# Patient Record
Sex: Female | Born: 1950 | Race: White | Hispanic: No | Marital: Married | Smoking: Never smoker
Health system: Southern US, Community
[De-identification: ages and names within clinical notes are randomized; demographics above are authoritative.]

## PROBLEM LIST (undated history)

## (undated) DIAGNOSIS — G709 Myoneural disorder, unspecified: Secondary | ICD-10-CM

## (undated) DIAGNOSIS — M179 Osteoarthritis of knee, unspecified: Secondary | ICD-10-CM

## (undated) DIAGNOSIS — K219 Gastro-esophageal reflux disease without esophagitis: Secondary | ICD-10-CM

## (undated) DIAGNOSIS — I10 Essential (primary) hypertension: Secondary | ICD-10-CM

## (undated) DIAGNOSIS — K635 Polyp of colon: Secondary | ICD-10-CM

## (undated) DIAGNOSIS — R7303 Prediabetes: Secondary | ICD-10-CM

## (undated) DIAGNOSIS — C801 Malignant (primary) neoplasm, unspecified: Secondary | ICD-10-CM

## (undated) DIAGNOSIS — D649 Anemia, unspecified: Secondary | ICD-10-CM

## (undated) DIAGNOSIS — J189 Pneumonia, unspecified organism: Secondary | ICD-10-CM

## (undated) DIAGNOSIS — M1710 Unilateral primary osteoarthritis, unspecified knee: Secondary | ICD-10-CM

## (undated) HISTORY — DX: Osteoarthritis of knee, unspecified: M17.9

## (undated) HISTORY — PX: WISDOM TOOTH EXTRACTION: SHX21

## (undated) HISTORY — DX: Essential (primary) hypertension: I10

## (undated) HISTORY — PX: CYSTOSCOPY: SUR368

## (undated) HISTORY — PX: LAPAROSCOPY: SHX197

## (undated) HISTORY — DX: Polyp of colon: K63.5

## (undated) HISTORY — DX: Gastro-esophageal reflux disease without esophagitis: K21.9

## (undated) HISTORY — DX: Unilateral primary osteoarthritis, unspecified knee: M17.10

## (undated) HISTORY — PX: OTHER SURGICAL HISTORY: SHX169

---

## 2003-12-17 ENCOUNTER — Ambulatory Visit: Payer: Self-pay | Admitting: Internal Medicine

## 2003-12-23 ENCOUNTER — Ambulatory Visit: Payer: Self-pay | Admitting: Internal Medicine

## 2004-01-15 ENCOUNTER — Ambulatory Visit: Payer: Self-pay | Admitting: Internal Medicine

## 2005-01-19 ENCOUNTER — Ambulatory Visit: Payer: Self-pay | Admitting: Internal Medicine

## 2005-01-25 ENCOUNTER — Ambulatory Visit: Payer: Self-pay | Admitting: Internal Medicine

## 2005-01-26 ENCOUNTER — Ambulatory Visit: Payer: Self-pay | Admitting: Family Medicine

## 2005-02-10 ENCOUNTER — Ambulatory Visit: Payer: Self-pay | Admitting: Internal Medicine

## 2005-08-08 ENCOUNTER — Ambulatory Visit: Payer: Self-pay | Admitting: Internal Medicine

## 2005-09-09 ENCOUNTER — Encounter: Admission: RE | Admit: 2005-09-09 | Discharge: 2005-09-09 | Payer: Self-pay | Admitting: Internal Medicine

## 2006-01-20 ENCOUNTER — Ambulatory Visit: Payer: Self-pay | Admitting: Internal Medicine

## 2006-01-20 LAB — CONVERTED CEMR LAB
ALT: 16 units/L (ref 0–40)
Chol/HDL Ratio, serum: 3.2
Cholesterol: 195 mg/dL (ref 0–200)
GFR calc non Af Amer: 92 mL/min
Glucose, Bld: 96 mg/dL (ref 70–99)
HCT: 39.6 % (ref 36.0–46.0)
HDL: 60.1 mg/dL (ref >39.0)
Hgb A1c MFr Bld: 5.7 % (ref 4.6–6.0)
Lymphocytes Relative: 27.5 % (ref 12.0–46.0)
MCHC: 33.7 g/dL (ref 30.0–36.0)
MCV: 88.2 fL (ref 78.0–100.0)
Monocytes Absolute: 0.5 10*3/uL (ref 0.2–0.7)
Neutrophils Relative %: 63.3 % (ref 43.0–77.0)
Potassium: 3.3 meq/L — ABNORMAL LOW (ref 3.5–5.1)
TSH: 1.45 microintl units/mL (ref 0.35–5.50)
Total Bilirubin: 0.9 mg/dL (ref 0.3–1.2)
Triglyceride fasting, serum: 94 mg/dL (ref 0–149)
Vitamin B-12: 1304 pg/mL — ABNORMAL HIGH (ref 211–911)
WBC: 6.7 10*3/uL (ref 4.5–10.5)

## 2006-01-31 DIAGNOSIS — K219 Gastro-esophageal reflux disease without esophagitis: Secondary | ICD-10-CM

## 2006-01-31 DIAGNOSIS — K635 Polyp of colon: Secondary | ICD-10-CM

## 2006-01-31 HISTORY — DX: Gastro-esophageal reflux disease without esophagitis: K21.9

## 2006-01-31 HISTORY — DX: Polyp of colon: K63.5

## 2006-01-31 HISTORY — PX: UPPER GI ENDOSCOPY: SHX6162

## 2006-02-16 ENCOUNTER — Ambulatory Visit: Payer: Self-pay | Admitting: Internal Medicine

## 2006-02-20 ENCOUNTER — Ambulatory Visit: Payer: Self-pay | Admitting: Internal Medicine

## 2006-02-20 LAB — CONVERTED CEMR LAB
Bilirubin Urine: NEGATIVE
Nitrite: NEGATIVE
Potassium: 3.9 meq/L (ref 3.5–5.1)
Protein, ur: NEGATIVE mg/dL
RBC / HPF: NONE SEEN (ref <3)
Urine Glucose: NEGATIVE mg/dL
Urobilinogen, UA: 0.2 (ref 0.0–1.0)

## 2006-06-27 DIAGNOSIS — G43009 Migraine without aura, not intractable, without status migrainosus: Secondary | ICD-10-CM | POA: Insufficient documentation

## 2006-06-27 DIAGNOSIS — Z9889 Other specified postprocedural states: Secondary | ICD-10-CM | POA: Insufficient documentation

## 2006-07-03 ENCOUNTER — Telehealth (INDEPENDENT_AMBULATORY_CARE_PROVIDER_SITE_OTHER): Payer: Self-pay | Admitting: *Deleted

## 2006-07-11 ENCOUNTER — Telehealth (INDEPENDENT_AMBULATORY_CARE_PROVIDER_SITE_OTHER): Payer: Self-pay | Admitting: *Deleted

## 2006-08-02 ENCOUNTER — Ambulatory Visit: Payer: Self-pay | Admitting: Internal Medicine

## 2006-10-24 ENCOUNTER — Ambulatory Visit: Payer: Self-pay | Admitting: Internal Medicine

## 2006-10-24 DIAGNOSIS — K219 Gastro-esophageal reflux disease without esophagitis: Secondary | ICD-10-CM | POA: Insufficient documentation

## 2006-10-26 ENCOUNTER — Encounter (INDEPENDENT_AMBULATORY_CARE_PROVIDER_SITE_OTHER): Payer: Self-pay | Admitting: *Deleted

## 2006-10-26 ENCOUNTER — Encounter: Payer: Self-pay | Admitting: Internal Medicine

## 2006-10-27 ENCOUNTER — Telehealth (INDEPENDENT_AMBULATORY_CARE_PROVIDER_SITE_OTHER): Payer: Self-pay | Admitting: *Deleted

## 2006-11-01 ENCOUNTER — Encounter: Payer: Self-pay | Admitting: Internal Medicine

## 2006-12-05 ENCOUNTER — Ambulatory Visit: Payer: Self-pay | Admitting: Internal Medicine

## 2006-12-22 ENCOUNTER — Ambulatory Visit: Payer: Self-pay | Admitting: Internal Medicine

## 2006-12-22 ENCOUNTER — Encounter: Payer: Self-pay | Admitting: Internal Medicine

## 2006-12-22 DIAGNOSIS — K5730 Diverticulosis of large intestine without perforation or abscess without bleeding: Secondary | ICD-10-CM | POA: Insufficient documentation

## 2006-12-22 DIAGNOSIS — K208 Other esophagitis without bleeding: Secondary | ICD-10-CM | POA: Insufficient documentation

## 2007-01-26 ENCOUNTER — Ambulatory Visit: Payer: Self-pay | Admitting: Internal Medicine

## 2007-01-26 DIAGNOSIS — I10 Essential (primary) hypertension: Secondary | ICD-10-CM | POA: Insufficient documentation

## 2007-02-02 ENCOUNTER — Encounter (INDEPENDENT_AMBULATORY_CARE_PROVIDER_SITE_OTHER): Payer: Self-pay | Admitting: *Deleted

## 2008-01-28 ENCOUNTER — Ambulatory Visit: Payer: Self-pay | Admitting: Internal Medicine

## 2008-01-28 DIAGNOSIS — Z860100 Personal history of colon polyps, unspecified: Secondary | ICD-10-CM | POA: Insufficient documentation

## 2008-01-28 DIAGNOSIS — J309 Allergic rhinitis, unspecified: Secondary | ICD-10-CM | POA: Insufficient documentation

## 2008-01-29 ENCOUNTER — Encounter: Payer: Self-pay | Admitting: Internal Medicine

## 2008-02-04 ENCOUNTER — Encounter (INDEPENDENT_AMBULATORY_CARE_PROVIDER_SITE_OTHER): Payer: Self-pay | Admitting: *Deleted

## 2008-04-22 ENCOUNTER — Ambulatory Visit: Payer: Self-pay | Admitting: Internal Medicine

## 2008-04-22 DIAGNOSIS — M169 Osteoarthritis of hip, unspecified: Secondary | ICD-10-CM | POA: Insufficient documentation

## 2008-04-22 DIAGNOSIS — M7120 Synovial cyst of popliteal space [Baker], unspecified knee: Secondary | ICD-10-CM | POA: Insufficient documentation

## 2008-04-22 DIAGNOSIS — IMO0002 Reserved for concepts with insufficient information to code with codable children: Secondary | ICD-10-CM | POA: Insufficient documentation

## 2008-04-22 DIAGNOSIS — M1710 Unilateral primary osteoarthritis, unspecified knee: Secondary | ICD-10-CM

## 2008-05-07 ENCOUNTER — Telehealth: Payer: Self-pay | Admitting: Internal Medicine

## 2009-02-12 ENCOUNTER — Telehealth (INDEPENDENT_AMBULATORY_CARE_PROVIDER_SITE_OTHER): Payer: Self-pay | Admitting: *Deleted

## 2009-04-20 ENCOUNTER — Encounter: Payer: Self-pay | Admitting: Internal Medicine

## 2009-04-21 ENCOUNTER — Encounter: Payer: Self-pay | Admitting: Internal Medicine

## 2009-04-27 ENCOUNTER — Encounter: Payer: Self-pay | Admitting: Internal Medicine

## 2009-04-28 ENCOUNTER — Ambulatory Visit (HOSPITAL_COMMUNITY): Admission: RE | Admit: 2009-04-28 | Discharge: 2009-04-28 | Payer: Self-pay | Admitting: Obstetrics and Gynecology

## 2009-04-28 ENCOUNTER — Encounter: Payer: Self-pay | Admitting: Internal Medicine

## 2009-05-21 ENCOUNTER — Telehealth (INDEPENDENT_AMBULATORY_CARE_PROVIDER_SITE_OTHER): Payer: Self-pay | Admitting: *Deleted

## 2009-05-28 ENCOUNTER — Ambulatory Visit: Payer: Self-pay | Admitting: Internal Medicine

## 2009-05-28 DIAGNOSIS — Z8719 Personal history of other diseases of the digestive system: Secondary | ICD-10-CM | POA: Insufficient documentation

## 2009-05-28 DIAGNOSIS — R209 Unspecified disturbances of skin sensation: Secondary | ICD-10-CM | POA: Insufficient documentation

## 2009-07-24 ENCOUNTER — Ambulatory Visit: Payer: Self-pay | Admitting: Internal Medicine

## 2009-10-29 ENCOUNTER — Ambulatory Visit: Payer: Self-pay | Admitting: Internal Medicine

## 2010-01-18 ENCOUNTER — Encounter: Payer: Self-pay | Admitting: Internal Medicine

## 2010-02-28 LAB — CONVERTED CEMR LAB
ALT: 15 units/L (ref 0–35)
ALT: 16 units/L (ref 0–35)
ALT: 18 units/L (ref 0–35)
AST: 17 units/L (ref 0–37)
AST: 18 units/L (ref 0–37)
AST: 18 units/L (ref 0–37)
Albumin: 4.1 g/dL (ref 3.5–5.2)
Alkaline Phosphatase: 108 units/L (ref 39–117)
BUN: 14 mg/dL (ref 6–23)
BUN: 8 mg/dL (ref 6–23)
Basophils Absolute: 0 10*3/uL (ref 0.0–0.1)
Basophils Absolute: 0 10*3/uL (ref 0.0–0.1)
Basophils Absolute: 0.1 10*3/uL (ref 0.0–0.1)
Basophils Relative: 0.6 % (ref 0.0–3.0)
Basophils Relative: 0.8 % (ref 0.0–1.0)
Bilirubin, Direct: 0 mg/dL (ref 0.0–0.3)
CO2: 28 meq/L (ref 19–32)
Calcium: 9.2 mg/dL (ref 8.4–10.5)
Chloride: 102 meq/L (ref 96–112)
Chloride: 105 meq/L (ref 96–112)
Cholesterol: 180 mg/dL (ref 0–200)
Cholesterol: 182 mg/dL (ref 0–200)
Cholesterol: 188 mg/dL (ref 0–200)
Creatinine, Ser: 0.6 mg/dL (ref 0.4–1.2)
Creatinine, Ser: 0.7 mg/dL (ref 0.4–1.2)
Eosinophils Absolute: 0.1 10*3/uL (ref 0.0–0.7)
Eosinophils Relative: 1.1 % (ref 0.0–5.0)
Eosinophils Relative: 1.4 % (ref 0.0–5.0)
Eosinophils Relative: 1.7 % (ref 0.0–5.0)
GFR calc non Af Amer: 109 mL/min
Glucose, Bld: 90 mg/dL (ref 70–99)
HDL: 46.8 mg/dL (ref >39.0)
HDL: 57.7 mg/dL (ref >39.00)
Hemoglobin: 12.8 g/dL (ref 12.0–15.0)
LDL Cholesterol: 95 mg/dL (ref 0–99)
Lymphocytes Relative: 25.4 % (ref 12.0–46.0)
Lymphocytes Relative: 31.1 % (ref 12.0–46.0)
Lymphs Abs: 1.5 10*3/uL (ref 0.7–4.0)
Monocytes Absolute: 0.3 10*3/uL (ref 0.1–1.0)
Monocytes Absolute: 0.4 10*3/uL (ref 0.1–1.0)
Monocytes Absolute: 0.4 10*3/uL (ref 0.2–0.7)
Monocytes Relative: 5.6 % (ref 3.0–12.0)
Neutro Abs: 5 10*3/uL (ref 1.4–7.7)
Neutrophils Relative %: 61.3 % (ref 43.0–77.0)
Neutrophils Relative %: 66.8 % (ref 43.0–77.0)
Neutrophils Relative %: 69.8 % (ref 43.0–77.0)
Platelets: 199 10*3/uL (ref 150.0–400.0)
Potassium: 4.3 meq/L (ref 3.5–5.1)
RBC: 4.31 M/uL (ref 3.87–5.11)
RBC: 4.46 M/uL (ref 3.87–5.11)
RDW: 12.8 % (ref 11.5–14.6)
RDW: 13.9 % (ref 11.5–14.6)
Sodium: 138 meq/L (ref 135–145)
Sodium: 140 meq/L (ref 135–145)
TSH: 1.39 microintl units/mL (ref 0.35–5.50)
TSH: 1.93 microintl units/mL (ref 0.35–5.50)
Total Bilirubin: 0.7 mg/dL (ref 0.3–1.2)
Total CHOL/HDL Ratio: 3.8
Total Protein: 7.7 g/dL (ref 6.0–8.3)
Triglycerides: 100 mg/dL (ref 0–149)
Triglycerides: 191 mg/dL — ABNORMAL HIGH (ref 0–149)
Triglycerides: 64 mg/dL (ref 0.0–149.0)
VLDL: 12.8 mg/dL (ref 0.0–40.0)
VLDL: 38 mg/dL (ref 0–40)
WBC: 4.9 10*3/uL (ref 4.5–10.5)
WBC: 7.2 10*3/uL (ref 4.5–10.5)

## 2010-03-02 NOTE — Letter (Signed)
Summary: External Correspondence-[facility]  External Correspondence-[facility]   Imported By: Vanessa Swaziland 12/18/2006 15:11:10  _____________________________________________________________________  External Attachment:    Type:   Image     Comment:   External Document

## 2010-03-02 NOTE — Progress Notes (Signed)
Summary: Shingles Vaccine Concerns  Phone Note Call from Patient Call back at [phone]   Summary of Call: Message left on VM: patient would like to discuss Shingles vaccine, patient has an appointment for next week and would like vaccine at that time.   I called patient back to inform her that he do not have the vaccine in stock and we have no set date as to when we will get it. I offered to give patient a rx for the vaccine and she can check with the pharmacies and see if they have it in stock and it can be adminisetered here. Patient agreed and requested to have rx mailed to her home address    New/Updated Medications: ZOSTAVAX 11914 UNT/0.65ML SOLR (ZOSTER VACCINE LIVE) 1 injection 0.23mL Subcutaneously X 1 Prescriptions: ZOSTAVAX 78295 UNT/0.65ML SOLR (ZOSTER VACCINE LIVE) 1 injection 0.60mL Subcutaneously X 1  #1 x 0   Entered by:   Shonna Chock   Authorized by:   Marga Melnick MD   Signed by:   Shonna Chock on 05/21/2009   Method used:   Print then Mail to Patient   RxID:   [id number]

## 2010-03-02 NOTE — Letter (Signed)
Summary: Results Follow up Letter  [facility] [place]  [address] [place], [place] [address]   Phone: [phone]  Fax: [phone]    02/02/2007 MRN: [id number]  [facility] [address] [place], [place]  [address]  Dear Ms. Slay,  The following are the results of your recent test(s):  Test         Result    Pap Smear:        Normal _____  Not Normal _____ Comments: ______________________________________________________ Cholesterol: LDL(Bad cholesterol):         Your goal is less than:         HDL (Good cholesterol):       Your goal is more than: Comments:  ______________________________________________________ Mammogram:        Normal _____  Not Normal _____ Comments:  ___________________________________________________________________ Hemoccult:        Normal _____  Not normal _______ Comments:    _____________________________________________________________________ Other Tests:  Please see attached results and comments   We routinely do not discuss normal results over the telephone.  If you desire a copy of the results, or you have any questions about this information we can discuss them at your next office visit.   Sincerely,

## 2010-03-02 NOTE — Letter (Signed)
Summary: [facility]   Imported By: Lanelle Bal 05/18/2009 09:44:06  _____________________________________________________________________  External Attachment:    Type:   Image     Comment:   External Document

## 2010-03-02 NOTE — Assessment & Plan Note (Signed)
Summary: SHINGLES VACCINE//KN   Nurse Visit   Allergies: 1)  ! * Emycin  Immunizations Administered:  Zostavax # 1:    Vaccine Type: Zostavax    Site: Right Arm    Mfr: Merck    Dose: 0.39mL    Route: [address]    Given by: Shonna Chock    Exp. Date: 08/26/2010    Lot #: [id number]  Orders Added: 1)  Zoster (Shingles) Vaccine Live [90736] 2)  Admin 1st Vaccine [14782]

## 2010-03-02 NOTE — Assessment & Plan Note (Signed)
Summary: flu shot/kn   Nurse Visit   Allergies: 1)  ! * Emycin  Orders Added: 1)  Admin 1st Vaccine [90471] 2)  Flu Vaccine 23yrs + [60454] Flu Vaccine Consent Questions     Do you have a history of severe allergic reactions to this vaccine? no    Any prior history of allergic reactions to egg and/or gelatin? no    Do you have a sensitivity to the preservative Thimersol? no    Do you have a past history of Guillan-Barre Syndrome? no    Do you currently have an acute febrile illness? no    Have you ever had a severe reaction to latex? no    Vaccine information given and explained to patient? yes    Are you currently pregnant? no    Lot Number:[id number]   Exp Date:07/31/2010   Site Given  Left Deltoid IMu Vaccine 90yrs + [09811] .lbflu

## 2010-03-02 NOTE — Assessment & Plan Note (Signed)
Summary: CPX/NS/KDC   Vital Signs:  Patient profile:   60 year old female Height:      64 inches Weight:      128.8 pounds BMI:     22.19 Temp:     98.4 degrees F oral Pulse rate:   72 / minute Resp:     14 per minute BP sitting:   118 / 72  (left arm) Cuff size:   regular  Vitals Entered By: Shonna Chock (May 28, 2009 8:42 AM)  CC: Hypertension Management Comments REVIEWED MED LIST, PATIENT AGREED DOSE AND INSTRUCTION CORRECT    CC:  Hypertension Management.  History of Present Illness: Kendra Leonard is here for a physical;she has  had some paresthesias intermittently in the  L back for past month w/o trigger or injury except gym exercises,including weight  machine.  Hypertension History:      She complains of neurologic problems, but denies headache, chest pain, palpitations, dyspnea with exertion, orthopnea, PND, peripheral edema, visual symptoms, syncope, and side effects from treatment.  BP ranges: 93/52-103/75 post exercise.        Positive major cardiovascular risk factors include female age 76 years old or older and hypertension.  Negative major cardiovascular risk factors include non-tobacco-user status.     Preventive Screening-Counseling & Management  Caffeine-Diet-Exercise     Does Patient Exercise: yes  Allergies: 1)  ! Pollyann Samples  Past History:  Past Medical History: Hypertension Headache, resolved on BP meds GERD Colonic polyps, hx of;Diverticulosis, colon; Erosive esophagitis ,PMH of , Dr Leone Payor Diverticulitis, hx of 03/2009  Past Surgical History: G2 P2;hematoma @  episiotomy site ;  Laparoscopy for infertility Colon polypectomy 3 mm polyp 12/2006, Dr Leone Payor; Endoscopy  12/2006: erosive esophagitis  Family History: Father: prostate  cancer, HTN,esophgeal stricture, thoracic aneurysm, dysrrhythmia Mother: uterine cancer, HTN, lymphedema leg post radiation & chemo Siblings: bro: HTN  Social History: no diet Occupation: Museum/gallery conservator Married Never Smoked Alcohol use-yes: socially Regular exercise-yes: gym , walking Does Patient Exercise:  yes  Review of Systems General:  Denies fatigue and sleep disorder; Weight down 20+# with CVE. Eyes:  Denies blurring, double vision, and vision loss-both eyes. ENT:  Denies difficulty swallowing and hoarseness. CV:  Denies leg cramps with exertion, lightheadness, and near fainting. Resp:  Denies cough, shortness of breath, and sputum productive. GI:  Complains of gas; denies abdominal pain, bloody stools, dark tarry stools, indigestion, nausea, and vomiting; Belching since diverticulitis. GU:  Denies discharge, dysuria, and hematuria; Neg Gyn exam 08/2008 Dr Vincente Poli. MS:  Denies joint pain, joint redness, joint swelling, low back pain, mid back pain, and thoracic pain. Derm:  Denies changes in nail beds, dryness, and hair loss. Neuro:  Complains of tingling; denies brief paralysis, numbness, and weakness. Psych:  Denies anxiety, depression, easily angered, easily tearful, and irritability. Endo:  Complains of cold intolerance; denies excessive hunger, excessive thirst, excessive urination, and heat intolerance. Heme:  Denies abnormal bruising and bleeding. Allergy:  Complains of sneezing; denies itching eyes and seasonal allergies; Perennial sneezing. No angioedema.  Physical Exam  General:  well-nourished; alert,appropriate and cooperative throughout examination Head:  Normocephalic and atraumatic without obvious abnormalities. Eyes:  No corneal or conjunctival inflammation noted.Perrla. Funduscopic exam benign, without hemorrhages, exudates or papilledema.  Ears:  External ear exam shows no significant lesions or deformities.  Otoscopic examination reveals clear canals, tympanic membranes are intact bilaterally without bulging, retraction, inflammation or discharge. Hearing is grossly normal bilaterally. Nose:  External nasal examination shows  no deformity or  inflammation. Nasal mucosa are pink and moist without lesions or exudates. Mouth:  Oral mucosa and oropharynx without lesions or exudates.  Teeth in good repair. Neck:  No deformities, masses, or tenderness noted. Thyroid small & slightly irregular w/o nodules Lungs:  Normal respiratory effort, chest expands symmetrically. Lungs are clear to auscultation, no crackles or wheezes. Heart:  Normal rate and regular rhythm. S1 and S2 normal without gallop, murmur, click, rub.S4 Abdomen:  Bowel sounds positive,abdomen soft and non-tender without masses, organomegaly or hernias noted. Aorta palpable; no AAA Genitalia:  Dr Vincente Poli Msk:  No deformity or scoliosis noted of thoracic or lumbar spine.   Pulses:  R and L carotid,radial,dorsalis pedis and posterior tibial pulses are full and equal bilaterally Extremities:  No clubbing, cyanosis, edema. Minor OA DIP finger  deformities noted with normal full range of motion of all joints.   Neurologic:  alert & oriented X3 and DTRs symmetrical and normal.   Skin:  Intact without suspicious lesions or rashes Cervical Nodes:  No lymphadenopathy noted Axillary Nodes:  No palpable lymphadenopathy Psych:  memory intact for recent and remote, normally interactive, and good eye contact.     Impression & Recommendations:  Problem # 1:  ROUTINE GENERAL MEDICAL EXAM@HEALTH  CARE FACL (ICD-V70.0)  Orders: EKG w/ Interpretation (93000) Venipuncture (10932) TLB-Lipid Panel (80061-LIPID) TLB-BMP (Basic Metabolic Panel-BMET) (80048-METABOL) TLB-CBC Platelet - w/Differential (85025-CBCD) TLB-Hepatic/Liver Function Pnl (80076-HEPATIC) TLB-TSH (Thyroid Stimulating Hormone) (84443-TSH)  Problem # 2:  PARESTHESIA (ICD-782.0) L T8 area , probably related to use of weight machines  Problem # 3:  HYPERTENSION (ICD-401.9)  controlled Her updated medication list for this problem includes:    Benazepril Hcl 40 Mg Tabs (Benazepril hcl) .Marland Kitchen... 1 by mouth qd  Orders: EKG w/  Interpretation (93000) Venipuncture (35573)  Problem # 4:  GERD (ICD-530.81) good control overall  Problem # 5:  DIVERTICULOSIS, COLON (ICD-562.10)  Complete Medication List: 1)  Benazepril Hcl 40 Mg Tabs (Benazepril hcl) .Marland Kitchen.. 1 by mouth qd 2)  Omeprazole 20 Mg (omeprazole)  .Marland Kitchen.. 1 30 min ac b'fast 3)  Meloxicam 7.5 Mg Tabs (Meloxicam) .Marland Kitchen.. 1 two times a day as needed 4)  Zostavax 22025 Unt/0.72ml Solr (Zoster vaccine live) .Marland Kitchen.. 1 injection 0.55ml subcutaneously x 1  Hypertension Assessment/Plan:      The patient's hypertensive risk group is category B: At least one risk factor (excluding diabetes) with no target organ damage.  Her calculated 10 year risk of coronary heart disease is 5 %.  Today's blood pressure is 118/72.    Patient Instructions: 1)  Check your Blood Pressure regularly. If it is above:135/85 ON AVERAGE  you should make an appointment. 2)  Avoid foods high in acid (tomatoes, citrus juices, spicy foods). Avoid eating within two hours of lying down or before exercising. Do not over eat; try smaller more frequent meals. Elevate head of bed twelve inches when sleeping. if symptoms increase take PPI pre eve meal also as needed . Prescriptions: OMEPRAZOLE 20 MG   (OMEPRAZOLE) 1 30 min ac b'fast  #90 x 3   Entered and Authorized by:   Marga Melnick MD   Signed by:   Marga Melnick MD on 05/28/2009   Method used:   Print then Give to Patient   RxID:   [id number] BENAZEPRIL HCL 40 MG  TABS (BENAZEPRIL HCL) 1 by mouth qd  #90 x 3   Entered and Authorized by:   Marga Melnick MD   Signed by:  Marga Melnick MD on 05/28/2009   Method used:   Print then Give to Patient   RxID:   [id number]

## 2010-03-02 NOTE — Progress Notes (Signed)
Summary: refill  Phone Note Refill Request Message from:  Fax from Pharmacy on [facility] fax [phone]  Refills Requested: Medication #1:  OMEPRAZOLE 20 MG   (OMEPRAZOLE) 1 30 min ac b'fast  Medication #2:  BENAZEPRIL HCL 40 MG  TABS 1 by mouth qd Initial call taken by: Barb Merino,  February 12, 2009 9:15 AM  Follow-up for Phone Call        Patient with pending appoitment in April Follow-up by: Shonna Chock,  February 12, 2009 9:41 AM    Prescriptions: OMEPRAZOLE 20 MG   (OMEPRAZOLE) 1 30 min ac b'fast  #90 x 1   Entered by:   Shonna Chock   Authorized by:   Marga Melnick MD   Signed by:   Shonna Chock on 02/12/2009   Method used:   Faxed to ...       [facility]* (mail-order)             ,          Ph: [phone]       Fax: [phone]   RxID:   [id number] BENAZEPRIL HCL 40 MG  TABS (BENAZEPRIL HCL) 1 by mouth qd  #90 x 1   Entered by:   Shonna Chock   Authorized by:   Marga Melnick MD   Signed by:   Shonna Chock on 02/12/2009   Method used:   Faxed to ...       [facility]* (mail-order)             ,          Ph: [phone]       Fax: [phone]   RxID:   [id number]

## 2010-03-04 NOTE — Letter (Signed)
Summary: [facility]  [facility]   Imported By: Lanelle Bal 01/28/2010 12:53:08  _____________________________________________________________________  External Attachment:    Type:   Image     Comment:   External Document

## 2010-03-27 ENCOUNTER — Encounter: Payer: Self-pay | Admitting: Internal Medicine

## 2010-04-08 ENCOUNTER — Encounter: Payer: Self-pay | Admitting: Internal Medicine

## 2010-04-23 ENCOUNTER — Telehealth: Payer: Self-pay | Admitting: *Deleted

## 2010-04-23 NOTE — Telephone Encounter (Signed)
Neti Rinse qd - bid  day as needed for nasal congestion. Fluticasone one spray twice a day intranasally is recommended. Over-the-counter loratadine will help treat allergic symptoms. She should be seen for fever or purulent secretions.

## 2010-04-23 NOTE — Telephone Encounter (Signed)
Per pt she spoke w/ another doctor and got it take care of. She does not need anything from [place].

## 2010-04-23 NOTE — Telephone Encounter (Signed)
Pt c/o dizziness and some pressure in ears. Pt notes that she feels it is her sinuses and her ears are stopped up. Pt requesting to be seen today or med Rx. Pt advise no open for today but offered Saturday clinic as well as UC pt refuse. Pt would like to know if med can be Rx. pls advise.Felecia Eviana Sibilia CMA

## 2010-04-23 NOTE — Telephone Encounter (Signed)
Left msg on machine for return call on home and mobile number.

## 2010-04-26 LAB — CREATININE, SERUM: Creatinine, Ser: 0.63 mg/dL (ref 0.4–1.2)

## 2010-04-28 ENCOUNTER — Other Ambulatory Visit: Payer: Self-pay | Admitting: Internal Medicine

## 2010-05-06 ENCOUNTER — Encounter: Payer: Self-pay | Admitting: Internal Medicine

## 2010-05-06 ENCOUNTER — Other Ambulatory Visit: Payer: Self-pay | Admitting: Internal Medicine

## 2010-05-06 ENCOUNTER — Ambulatory Visit (INDEPENDENT_AMBULATORY_CARE_PROVIDER_SITE_OTHER): Payer: BC Managed Care – PPO | Admitting: Internal Medicine

## 2010-05-06 DIAGNOSIS — R300 Dysuria: Secondary | ICD-10-CM

## 2010-05-06 DIAGNOSIS — H8110 Benign paroxysmal vertigo, unspecified ear: Secondary | ICD-10-CM

## 2010-05-06 DIAGNOSIS — R319 Hematuria, unspecified: Secondary | ICD-10-CM

## 2010-05-06 DIAGNOSIS — Z136 Encounter for screening for cardiovascular disorders: Secondary | ICD-10-CM

## 2010-05-06 DIAGNOSIS — K219 Gastro-esophageal reflux disease without esophagitis: Secondary | ICD-10-CM

## 2010-05-06 DIAGNOSIS — N959 Unspecified menopausal and perimenopausal disorder: Secondary | ICD-10-CM

## 2010-05-06 DIAGNOSIS — I10 Essential (primary) hypertension: Secondary | ICD-10-CM

## 2010-05-06 DIAGNOSIS — Z0000 Encounter for general adult medical examination without abnormal findings: Secondary | ICD-10-CM

## 2010-05-06 LAB — CBC WITH DIFFERENTIAL/PLATELET
Basophils Absolute: 0 10*3/uL (ref 0.0–0.1)
Basophils Relative: 0.5 % (ref 0.0–3.0)
Eosinophils Absolute: 0.1 10*3/uL (ref 0.0–0.7)
HCT: 38.3 % (ref 36.0–46.0)
Hemoglobin: 13 g/dL (ref 12.0–15.0)
MCV: 88.6 fl (ref 78.0–100.0)
Monocytes Absolute: 0.4 10*3/uL (ref 0.1–1.0)
Neutrophils Relative %: 61.9 % (ref 43.0–77.0)
Platelets: 202 10*3/uL (ref 150.0–400.0)
RBC: 4.32 Mil/uL (ref 3.87–5.11)
RDW: 13.2 % (ref 11.5–14.6)
WBC: 5.8 10*3/uL (ref 4.5–10.5)

## 2010-05-06 LAB — POCT URINALYSIS DIPSTICK
Bilirubin, UA: NEGATIVE
Ketones, UA: NEGATIVE
Nitrite, UA: NEGATIVE
Protein, UA: NEGATIVE
Urobilinogen, UA: 0.2

## 2010-05-06 LAB — BASIC METABOLIC PANEL
BUN: 16 mg/dL (ref 6–23)
CO2: 30 mEq/L (ref 19–32)
Calcium: 9.1 mg/dL (ref 8.4–10.5)
GFR: 88.71 mL/min (ref >60.00)
Sodium: 141 mEq/L (ref 135–145)

## 2010-05-06 LAB — HEPATIC FUNCTION PANEL
ALT: 17 U/L (ref 0–35)
Albumin: 4.2 g/dL (ref 3.5–5.2)
Alkaline Phosphatase: 89 U/L (ref 39–117)
Total Bilirubin: 0.8 mg/dL (ref 0.3–1.2)
Total Protein: 7.4 g/dL (ref 6.0–8.3)

## 2010-05-06 LAB — LIPID PANEL
Cholesterol: 198 mg/dL (ref 0–200)
HDL: 63.6 mg/dL (ref >39.00)
VLDL: 12 mg/dL (ref 0.0–40.0)

## 2010-05-06 NOTE — Patient Instructions (Addendum)
Please go to Web M.D. for information on benign positional vertigo. Employ the maneuvers should you have another acute episode. If symptoms do recur, referral to physical therapy would be appropriate           Preventive Health Care: Exercise  30-45  minutes a day, 3-4 days a week. Walking is especially valuable in preventing Osteoporosis. Eat a low-fat diet with lots of fruits and vegetables, up to 7-9 servings per day. Avoid obesity; your goal = waist less than 35 inches.Consume less than 30 grams of sugar per day from foods & drinks with High Fructose Corn Syrup as #2,3 or #4 on label. Seatbelts can save your life. Wear them always. Smoke detectors on every level of your home, check batteries every year. Eye Doctor - have an eye exam @ least annually.  The computer interpretation of the EKG is not correct. You  did not have any heart block. There is one premature beat. Keep a copy of the EKG in your home reference file. Take a copy of the medical records when you travel.

## 2010-05-06 NOTE — Progress Notes (Signed)
Subjective:    Patient ID: Kendra Leonard, female    DOB: [birth year], 60 y.o.   MRN: [id number]  HPI She is here for a physical; she recently had dizziness attributed to ? middle ear issues with pollen exposures. Meclizine & Coricidin HB  helped. The major and minor symptoms of rhinosinusitis were reviewed. She denies nasal congestion/obstruction; nasal purulence; facial pain; anosmia; fatigue; fever; headache; halitosis; earache and dental pain.   She's had several episodes in the last 3 weeks of the dizziness. The most severe involvement has occurred  when supine either lying on the couch or in bed. This is associated with frank vertigo. There were no cardiac or neuro triggers prior to the symptoms.                                                                        She denies extrinsic symptoms of itchy eyes or  sneezing.   Review of Systems Patient reports no vision/ hearing  changes, adenopathy,fever, weight change,  persistant / recurrent hoarseness , swallowing issues, chest pain,palpitations,edema,persistant /recurrent cough, hemoptysis, dyspnea( rest/ exertional/paroxysmal nocturnal), gastrointestinal bleeding(melena, rectal bleeding), abdominal pain, significant heartburn bowel changes,GU symptoms(dysuria,pyuria, incontinence) ), Gyn symptoms(abnormal  bleeding , pain),  syncope, focal weakness, memory loss,numbness & tingling, skin/hair /nail changes,abnormal bruising or bleeding, anxiety,or depression.      Objective:   Physical Exam General appearance is one of good health and nourishment. Skull is normocephalic without lymphadenopathy about the head, neck, or axilla. See current vital signs Eye - Pupils Equal Round Reactive to light, Extraocular movements intact, Fundi without hemorrhage or visible lesions, Conjunctiva without redness or discharge. No nystagmus.Ears:  External ear exam shows no significant lesions or deformities.  Otoscopic examination reveals clear canals, tympanic  membranes are intact bilaterally without bulging, retraction, inflammation or discharge. Hearing is grossly normal bilaterally.Tuning fork exam normal.Oral exam: Dental hygiene is good; lips and gums are healthy appearing.There is no oropharyngeal erythema or exudate noted. Heart:  Normal rate and regular rhythm. S1 and S2 normal without gallop, murmur, click, rub or other extra sounds.                                                                                                      Lungs:Chest clear to auscultation; no wheezes, rhonchi,rales ,or rubs present.No increased work of breathing. Bowel sounds are normal. Abdomen is soft and nontender with no organomegaly, hernias  or masses. Musculoskeletal/Extremities: Gait and station; range of motion; stability; muscle strength; and  muscle tone are normal. Nail health is good. There are  no significant deformities of the digits.Minor DIP OA changes . No cyanosis, clubbing or edema are present. Neuro: Cranial nerve exam revealed no deficits;sensation to light touch was normal ; deep tendon reflexes were normal; gait (heel/toe) normal; balance normal ; Romberg/ finger to  nose  testing normal.Strength & tone normal . Psych:  Cognition and judgment appear intact. Alert, communicative  and cooperative with normal attention span and concentration. Gyn : as per Dr Vincente Poli. Prominent vein RLE ; Homan's negative.  Assessment & Plan:  #1 comprehensive physical exam; no acute findings  #2 classic benign positional vertigo, improved  #3 Hypertension, excellent control  #4 GERD

## 2010-05-08 LAB — URINE CULTURE

## 2010-05-10 ENCOUNTER — Other Ambulatory Visit: Payer: Self-pay | Admitting: Internal Medicine

## 2010-05-10 ENCOUNTER — Telehealth: Payer: Self-pay

## 2010-05-10 NOTE — Telephone Encounter (Signed)
Message copied by Stephan Minister on Mon May 10, 2010 10:09 AM ------      Message from: Marga Melnick      Created: Sun May 09, 2010  8:13 AM       Chart reviewed ; no known allergies. Amoxicillin  will be called into drugstore of choice . Copy of results will be mailed.

## 2010-05-10 NOTE — Telephone Encounter (Signed)
Spoke with patient on work phone, patient would like rx sent to [facility] and a copy of culture sent to [facility]. Culture faxed to [phone]

## 2010-05-11 ENCOUNTER — Telehealth: Payer: Self-pay | Admitting: *Deleted

## 2010-05-11 MED ORDER — AMOXICILLIN 500 MG PO CAPS: 500.0000 mg | ORAL_CAPSULE | Freq: Three times a day (TID) | ORAL | Status: AC

## 2010-05-11 NOTE — Telephone Encounter (Signed)
Message copied by Doristine Devoid on Tue May 11, 2010  9:32 AM ------      Message from: Marga Melnick      Created: Tue May 11, 2010  6:03 AM       Her Amox was called in for UTI wasn't it ? Thanks, Hopp      ----- Message -----         From: SYSTEM         Sent: 05/11/2010  12:00 AM           To: Pecola Lawless, MD

## 2010-05-12 ENCOUNTER — Encounter: Payer: Self-pay | Admitting: Internal Medicine

## 2010-06-15 NOTE — Assessment & Plan Note (Signed)
[facility]                         GASTROENTEROLOGY OFFICE NOTE   NAME:Leonard, Kendra Gurney                      MRN:          [id number]  DATE:08/02/2006                            DOB:          04/22/1950    CHIEF COMPLAINT:  Dysphagia.   ASSESSMENT:  A 60 year old white woman with chronic heartburn symptoms  since the birth of her child 19 years ago.  She is also having some  dysphagia to pills at times.  She is also in need of colon cancer  screening.   RECOMMENDATIONS AND PLAN:  1. Upper GI endoscopy to evaluate this dysphagia as well as her      chronic heartburn symptomatology.  2. Screening colonoscopy.   Risks, benefits, and indications have been explained.  She understands,  and agrees to proceed.   Note, that I was informed by the staff she did not schedule in the next  month or 2 because of conflicts on her schedule.  She said she would  call us back in September.  We will also put a recall in to try to make  sure she follows up.  I did emphasize the importance to get screened for  colon cancer, as well as to evaluate the esophagus, given these  problems.   HISTORY:  See medical history form for full details.  This lady has been  having some increasing heartburn and reflux symptoms.  She uses  omeprazole p.r.n.  She has been having some difficulty swallowing pills,  which is somewhat new.  She seems to have fairly frequent problems with  heartburn at this point, however.  She feels like she is gaining weight.   GI review of systems is otherwise negative.   MEDICATIONS:  1. Omeprazole 20 mg as needed.  2. Aspirin 81 mg daily.  3. Calcium daily.  4. Vitamin D daily.  5. Vitamin B12 daily.  6. Benazepril/hydrochlorothiazide 40 mg daily.   DRUG ALLERGIES:  SHE IS SENSITIVE TO ERYTHROMYCIN.   PAST MEDICAL HISTORY:  Problems:  1. Hypertension.  2. Headaches, which improved after treatment of hypertension.  3. Allergies with sinus  problems.  4. History of what I think was Henoch-Schonlein purpura age 74.   No major surgeries.   FAMILY HISTORY:  Mother had uterine cancer.  Father had prostate cancer.  No colon cancer in 1st degree relatives, though an uncle had colon  cancer.  Both her father and brother have dysphagia problems, and had  esophageal dilations.  See medical history form for further details.   SOCIAL HISTORY:  She is married.  She is an Government social research officer.  One son  and one daughter.  Social alcohol.  No tobacco or drugs.   REVIEW OF SYSTEMS:  See medical history form.   PHYSICAL EXAMINATION:  Reveals a pleasant middle-aged white woman in no  acute distress.  Height 5 feet 4 inches.  Weight 142.4 pounds.  This is a normal BMI.  Blood pressure 102/72.  Pulse 72.  Eyes anicteric.  ENT:  Normal  mouth, posterior pharynx.  NECK:  Supple.  No thyromegaly or mass.  CHEST:  Clear.  HEART:  S1 and S2.  No rubs, murmurs, or gallops.  ABDOMEN:  Soft and nontender without organomegaly or mass.  RECTAL:  Exam is deferred.  LYMPHATIC:  No neck adenopathy.  EXTREMITIES:  Free of edema.  SKIN:  No rash.  PSYCHIATRIC:  She is alert and oriented x3.   I appreciate the opportunity to care for this patient.  I have reviewed  the office notes kindly sent by Dr. Alwyn Ren.  Note, her screening  Hemoccults were negative.  However, screening colonoscopy is still  appropriate.     Iva Boop, MD,FACG  Electronically Signed    CEG/MedQ  DD: 08/02/2006  DT: 08/03/2006  Job #: [id number]   cc:   Titus Dubin. Alwyn Ren, MD,FACP,FCCP

## 2010-06-18 NOTE — Assessment & Plan Note (Signed)
[facility]                        [place] OFFICE NOTE   NAME:Yackel, Imagene Gurney                      MRN:          [id number]  DATE:05/03/2006                            DOB:          1951/01/12    Raynelle Fanning had a complete physical examination on January 20, 2006 at the age  of 59.   Her active complaint at that time was dyspepsia, which was constant.  It  had been present for over 4 months and progressive.  She had used  Prilosec over the counter for 14 days with questionable benefit.  She  had irregularly used Tagamet with questionable benefit as well.  She  feels that a major component are the work  stresses.  She denies  ingestion of nonsteroidals, alcohol, peppermint.  She does have 2  glasses of tea per day or 2 glasses of cola.   She denies melena, dysphagia or other gastrointestinal symptoms.  There  has been no weight loss or other constitutional symptoms.   PAST MEDICAL HISTORY:  Including a infected episiotomy post partum.  She  is gravida 2, para 2.  She had laparoscopic surgery for infertility.   She does have a history of migraines, which have improved with blood  pressure control.   Her mother had uterine cancer, hypertension.  Her mother received  chemotherapy and radiation.  Her father had prostate cancer,  hypertension, dysrhythmia, aortic aneurysm.  He also had esophageal  stricture requiring diltation.  Grandfather had stroke and diabetes.  Grandmother had aneurysm.  Maternal aunts and uncles had diabetes.   She has never smoked.  She is allergic or intolerant to E-Mycin, which  cause gastrointestinal symptoms.  Hydrochlorothiazide caused  hypokalemia.   At the time of the exam she was on Benazepril HCT 20/12.5 mg, vitamin  B12 tablets and Excedrin as needed.  She was no longer taking  Gabapentin.   REVIEW OF SYSTEMS:  Reveals that she has not had a colonoscopy.  She  made the comment I just haven't had it.  She  further states I don't  like tests.  She is on Os-Cal twice a day for osteoporosis prevention.   Weight was 141, pulse 60 and regular, respiratory rate 16 and blood  pressure 124/84.  She does have slight arteriolar narrowing on fundal  exam.   Otolaryngologic exam was unremarkable.   The thyroid is slightly asymmetric in size.  She has a prominent hyoid  bone and a slightly prominent larynx to palpation.   Chest was clear to auscultation with no increased work of breathing.   She has a S4 with slurring.   Breast, pelvic and rectal exam are deferred to her gynecologist Dr. Katherine Roan.   No carotid bruits or aneurysm.  All pulses are intact.  There is no  edema.   She has no lymphadenopathy of the head, neck or axilla; there is no  organomegaly.  Abdomen was nontender with no masses.   Musculoskeletal exam and neurologic exam were unremarkable.   Neuro/psychiatric exam is negative, although she exhibits stress.   EKG is normal.   Normal  and negative studies include CBC and differential and CMET with  the exception of potassium of 3.3.  A1c was 5.7 and carbohydrate  restriction was recommended.  Her B12 was super normal.  Lipids were at  goal, except for a LDL of 116.  I recommended the book by Dr. Letta Kocher  eat, drink and be healthy.   The urinalysis suggested a possible urinary tract infection in the  absence of symptoms.  Culture revealed greater than 100000 colonies of  Citrobacter freundii for which she received Macrodantin.   Hemoccult cards were negative.   Her potassium was rechecked after she had stopped Benazepril HCT and the  value was 3.9.  She was to continue Benazepril 40 mg one half to one as  needed for blood pressure, averaging greater than 130/85.   The triggers for esophageal reflux were discussed with her.  Omeprazole  20 mg 30 minutes before breakfast was recommended.  She was to take the  Omeprazole for 8 weeks and then take it as needed  thereafter.   GI consultation should be pursued if the dyspepsia persists.  Additionally, a screening colonoscopy is indicated based on the  standards.   To facilitate continuity of care a copy of this will be sent to Ms.  Lendell Caprice.     Titus Dubin. Alwyn Ren, MD,FACP,FCCP  Electronically Signed    [facility]  DD: 05/03/2006  DT: 05/03/2006  Job #: [id number]   cc:   Oren Section

## 2010-06-29 ENCOUNTER — Other Ambulatory Visit: Payer: BC Managed Care – PPO

## 2010-07-06 ENCOUNTER — Ambulatory Visit (INDEPENDENT_AMBULATORY_CARE_PROVIDER_SITE_OTHER)
Admission: RE | Admit: 2010-07-06 | Discharge: 2010-07-06 | Disposition: A | Discharge status: Home / Self Care | Payer: BC Managed Care – PPO | Source: Ambulatory Visit | Attending: Internal Medicine | Admitting: Internal Medicine

## 2010-07-06 DIAGNOSIS — N959 Unspecified menopausal and perimenopausal disorder: Secondary | ICD-10-CM

## 2010-07-06 DIAGNOSIS — Z13820 Encounter for screening for osteoporosis: Secondary | ICD-10-CM

## 2010-07-09 ENCOUNTER — Other Ambulatory Visit: Payer: Self-pay | Admitting: Internal Medicine

## 2010-09-23 ENCOUNTER — Telehealth: Payer: Self-pay | Admitting: Internal Medicine

## 2010-09-23 NOTE — Telephone Encounter (Signed)
Pt called says that she has been seen for baker's cyst a while ago says it has started to flare up again. Was given rx for Meloxicam at this time and would like to have rx called to pharmacy. Pls advise.

## 2010-09-23 NOTE — Telephone Encounter (Signed)
This can be taken for up to a week @ a  time but  should not be used as a maintenance medicine because of long-term cardiac and GI risks. Loxitane 7.5 mg twice a day as needed dispense 14.

## 2010-09-23 NOTE — Telephone Encounter (Signed)
Left msg for pt to return call.

## 2010-09-24 MED ORDER — MELOXICAM 7.5 MG PO TABS: ORAL_TABLET | ORAL | Status: DC

## 2010-09-24 NOTE — Telephone Encounter (Signed)
Spoke w/ pt aware rx sent to pharmacy.  

## 2010-09-27 NOTE — Telephone Encounter (Signed)
Error

## 2010-11-18 ENCOUNTER — Ambulatory Visit (INDEPENDENT_AMBULATORY_CARE_PROVIDER_SITE_OTHER): Payer: BC Managed Care – PPO

## 2010-11-18 DIAGNOSIS — Z23 Encounter for immunization: Secondary | ICD-10-CM

## 2011-01-16 ENCOUNTER — Other Ambulatory Visit: Payer: Self-pay | Admitting: Internal Medicine

## 2011-04-11 ENCOUNTER — Other Ambulatory Visit: Payer: Self-pay | Admitting: Internal Medicine

## 2011-04-12 NOTE — Telephone Encounter (Signed)
Prescription sent to pharmacy, no refills.  Due for appointment and scheduled for 04/27/11

## 2011-04-27 ENCOUNTER — Encounter: Payer: Self-pay | Admitting: Internal Medicine

## 2011-04-27 ENCOUNTER — Ambulatory Visit (INDEPENDENT_AMBULATORY_CARE_PROVIDER_SITE_OTHER): Payer: BC Managed Care – PPO | Admitting: Internal Medicine

## 2011-04-27 VITALS — BP 118/78 | HR 72 | Temp 98.2°F | Resp 12 | Ht 64.0 in | Wt 143.8 lb

## 2011-04-27 DIAGNOSIS — Z0000 Encounter for general adult medical examination without abnormal findings: Secondary | ICD-10-CM

## 2011-04-27 DIAGNOSIS — R3129 Other microscopic hematuria: Secondary | ICD-10-CM | POA: Insufficient documentation

## 2011-04-27 DIAGNOSIS — Z86010 Personal history of colonic polyps: Secondary | ICD-10-CM

## 2011-04-27 DIAGNOSIS — Z8719 Personal history of other diseases of the digestive system: Secondary | ICD-10-CM

## 2011-04-27 DIAGNOSIS — N959 Unspecified menopausal and perimenopausal disorder: Secondary | ICD-10-CM

## 2011-04-27 LAB — CBC WITH DIFFERENTIAL/PLATELET
Basophils Relative: 0.6 % (ref 0.0–3.0)
Eosinophils Relative: 1.5 % (ref 0.0–5.0)
HCT: 37.9 % (ref 36.0–46.0)
Hemoglobin: 12.4 g/dL (ref 12.0–15.0)
Lymphs Abs: 1.4 10*3/uL (ref 0.7–4.0)
MCV: 88.3 fl (ref 78.0–100.0)
Monocytes Absolute: 0.3 10*3/uL (ref 0.1–1.0)
Neutro Abs: 3.4 10*3/uL (ref 1.4–7.7)
RBC: 4.29 Mil/uL (ref 3.87–5.11)
WBC: 5.2 10*3/uL (ref 4.5–10.5)

## 2011-04-27 LAB — HEPATIC FUNCTION PANEL
ALT: 18 U/L (ref 0–35)
Bilirubin, Direct: 0 mg/dL (ref 0.0–0.3)
Total Protein: 7.3 g/dL (ref 6.0–8.3)

## 2011-04-27 LAB — LIPID PANEL
Cholesterol: 169 mg/dL (ref 0–200)
Total CHOL/HDL Ratio: 3
Triglycerides: 86 mg/dL (ref 0.0–149.0)

## 2011-04-27 LAB — BASIC METABOLIC PANEL
Chloride: 103 mEq/L (ref 96–112)
Potassium: 3.9 mEq/L (ref 3.5–5.1)

## 2011-04-27 MED ORDER — TRAMADOL HCL 50 MG PO TABS: 50.0000 mg | ORAL_TABLET | Freq: Four times a day (QID) | ORAL | Status: AC | PRN

## 2011-04-27 MED ORDER — FLUTICASONE PROPIONATE 50 MCG/ACT NA SUSP: 1.0000 | Freq: Two times a day (BID) | NASAL | Status: DC | PRN

## 2011-04-27 NOTE — Patient Instructions (Addendum)
Preventive Health Care: Exercise at least 30-45 minutes a day,  3-4 days a week.  Eat a low-fat diet with lots of fruits and vegetables, up to 7-9 servings per day. Consume less than 40 grams of sugar per day from foods & drinks with High Fructose Corn Sugar as #1,2,3 or # 4 on label. Blood Pressure Goal  Ideally is an AVERAGE < 135/85. This AVERAGE should be calculated from @ least 5-7 BP readings taken @ different times of day on different days of week. You should not respond to isolated BP readings , but rather the AVERAGE for that week  Plain Mucinex for thick secretions ;increase NON dairy fluids. Use a Neti pot daily as needed for sinus congestion. Fluticasone 1 spray in each nostril twice a day as needed. Use the "crossover" technique as discussed  To prevent palpitations or premature beats, avoid stimulants such as decongestants, diet pills, nicotine, or caffeine (coffee, tea, cola, or chocolate) to excess.

## 2011-04-27 NOTE — Progress Notes (Signed)
Addended by: Maurice Small on: 04/27/2011 09:27 AM   Modules accepted: Orders

## 2011-04-27 NOTE — Progress Notes (Signed)
Subjective:    Patient ID: Kendra Leonard, female    DOB: October 10, 1950, 61 y.o.   MRN: [id number]  HPI Kendra Leonard is here for a physical;acute issues include perennial nasal congestion nocturnally      Review of Systems  HYPERTENSION: Disease Monitoring  Blood pressure range:not monitored              Chest pain: no   Dyspnea: no   Claudication: no but occasional toe cramps   Medication compliance: yes  Medication Side Effects  Lightheadedness: no  Urinary frequency: no ; PMH of asymptomatic microscopic hematuria. She denies visible hematuria, pyuria, or dysuria at this time. She has had multiple cystoscopies in the past.  Edema:no   Preventitive Healthcare:  Exercise:gym 1-2 X/ week   Diet Pattern: no plan  Salt Restriction: no  She describes dry eyes in the Spring without itching or watering. She denies using decongestants for the nasal symptoms       Objective:   Physical Exam Gen.: Healthy and well-nourished in appearance. Alert, appropriate and cooperative throughout exam. Head: Normocephalic without obvious abnormalities  Eyes: No corneal or conjunctival inflammation noted. Pupils equal round reactive to light and accommodation. Fundal exam is benign without hemorrhages, exudate, papilledema. Extraocular motion intact. Vision grossly normal. Ears: External  ear exam reveals no significant lesions or deformities. Canals clear .TMs normal. Hearing is grossly normal bilaterally. Nose: External nasal exam reveals no deformity or inflammation. Nasal mucosa are pink and moist. No lesions or exudates noted.   Mouth: Oral mucosa and oropharynx reveal no lesions or exudates. Teeth in good repair. Neck: No deformities, masses, or tenderness noted. Range of motion & Thyroid normal Lungs: Normal respiratory effort; chest expands symmetrically. Lungs are clear to auscultation without rales, wheezes, or increased work of breathing. Heart: Normal rate and rhythm. Normal S1 and S2. No  gallop, click, or rub. S4 with slurring at  LSB  Abdomen: Bowel sounds normal; abdomen soft and nontender. No masses, organomegaly or hernias noted. Genitalia: Dr Vincente Poli                                                                         Musculoskeletal/extremities: minimal lordosis noted of  the thoracic  spine. No clubbing, cyanosis, edema, or deformity noted. Range of motion  normal .Tone & strength  normal.Joints normal. Nail health  good. Vascular: Carotid, radial artery, dorsalis pedis and  posterior tibial pulses are full and equal. No bruits present. Neurologic: Alert and oriented x3. Deep tendon reflexes symmetrical and normal.          Skin: Intact without suspicious lesions or rashes. Lymph: No cervical, axillary lymphadenopathy present. Psych: Mood and affect are normal. Normally interactive                                                                                         Assessment &  Plan:  #1 comprehensive physical exam; no acute findings #2 see Problem List with Assessments & Recommendations Plan: see Orders

## 2011-05-11 ENCOUNTER — Telehealth: Payer: Self-pay | Admitting: Internal Medicine

## 2011-05-11 NOTE — Telephone Encounter (Signed)
Caller: Mona/Patient; Phone Number: [phone]; Message from caller: She [facility] and thinks she may need chest xray due to wheezing, .  Please call patient and advise.

## 2011-05-11 NOTE — Telephone Encounter (Signed)
I reviewed the records concerning her cough. Exam stated that her chest was clear without wheezes. Her oxygen saturation was 99% which is excellent. She had 1 temperature elevation and no increased rate of respirations.  Chest x-ray and followup office visit would be indicated if the cough has failed to respond to the prescribed medicines. Chest x-ray could be done at [facility] prior to F/U OV

## 2011-05-12 ENCOUNTER — Other Ambulatory Visit: Payer: Self-pay | Admitting: Internal Medicine

## 2011-05-12 ENCOUNTER — Ambulatory Visit (HOSPITAL_BASED_OUTPATIENT_CLINIC_OR_DEPARTMENT_OTHER)
Admission: RE | Admit: 2011-05-12 | Discharge: 2011-05-12 | Disposition: A | Discharge status: Home / Self Care | Payer: BC Managed Care – PPO | Source: Ambulatory Visit | Attending: Internal Medicine | Admitting: Internal Medicine

## 2011-05-12 ENCOUNTER — Ambulatory Visit (INDEPENDENT_AMBULATORY_CARE_PROVIDER_SITE_OTHER): Payer: BC Managed Care – PPO | Admitting: Internal Medicine

## 2011-05-12 VITALS — BP 130/86 | HR 98 | Temp 98.3°F | Wt 145.0 lb

## 2011-05-12 DIAGNOSIS — R0989 Other specified symptoms and signs involving the circulatory and respiratory systems: Secondary | ICD-10-CM

## 2011-05-12 DIAGNOSIS — R059 Cough, unspecified: Secondary | ICD-10-CM

## 2011-05-12 DIAGNOSIS — J209 Acute bronchitis, unspecified: Secondary | ICD-10-CM

## 2011-05-12 DIAGNOSIS — R319 Hematuria, unspecified: Secondary | ICD-10-CM

## 2011-05-12 DIAGNOSIS — R05 Cough: Secondary | ICD-10-CM

## 2011-05-12 LAB — POCT URINALYSIS DIPSTICK
Glucose, UA: NEGATIVE
Spec Grav, UA: <=1.005
Urobilinogen, UA: 0.2

## 2011-05-12 MED ORDER — HYDROCODONE-HOMATROPINE 5-1.5 MG/5ML PO SYRP: 5.0000 mL | ORAL_SOLUTION | Freq: Four times a day (QID) | ORAL | Status: AC | PRN

## 2011-05-12 MED ORDER — AZITHROMYCIN 250 MG PO TABS: ORAL_TABLET | ORAL | Status: AC

## 2011-05-12 NOTE — Telephone Encounter (Signed)
Discuss with patient, already has pending OV will come in for that and hold off on x-ray for now per Dr Alwyn Ren.

## 2011-05-12 NOTE — Progress Notes (Signed)
Addended by: Maurice Small on: 05/12/2011 02:23 PM   Modules accepted: Orders

## 2011-05-12 NOTE — Patient Instructions (Addendum)
Please  blowup at least 10  balloons a day to enhance inflation of the lungs and prevent atelectasis as we discussed. Order for x-rays entered into  the computer; these will be performed at [facility]. No appointment is necessary.  Fill the prescription for prednisone if you dramatically better in the next 36 hours. Please remain out of work until  05/16/11

## 2011-05-12 NOTE — Telephone Encounter (Signed)
Pt states that she does not feel that Dr Alwyn Ren review the info completely and would like for him to review page 2 of the report where it is recommended that she have chest x-ray due to the possibility the Pt may have pneumonia. Pt is not satisfied with Dr Alwyn Ren response to her report. Pt states some negative comments about staff and hung up. Please advise

## 2011-05-12 NOTE — Telephone Encounter (Signed)
Order for x-rays entered into  the computer; these will be performed at [facility]. No appointment is necessary. Dx: bronchitis

## 2011-05-12 NOTE — Progress Notes (Signed)
  Subjective:    Patient ID: Kendra Leonard, female    DOB: Jun 28, 1950, 61 y.o.   MRN: [id number]  HPI Her symptoms began 05/02/11 as a cough which was essentially dry. On 4/2-3 she had malaise, low grade fever and progression to a deep  cough. She's had intermittent clear to white secretions.  She denied any extrinsic symptoms such as itchy eyes or sneezing prior to the onset of symptoms  She was seen 4/8 at the [facility]. At that time her temperature was 99.6, heart rate 91 respiratory rate 12 and O2 sats 99% on room air. The physical described the absence of any abnormal breth sounds; but there is a" referral "note mentioning wheezing.  She has no history of asthma; she is on an ACE inhibitor for hypertension. She's had a mild flare of reflux symptoms drinking orange juice to stay rehydrated.    Review of Systems She denies frontal headache, facial pain, nasal purulence, sore throat, or dental pain. She's had some residual soreness in her chest from the cough. The cough has responded somewhat to night while     Objective:   Physical Exam General appearance:good health ;well nourished; no acute distress or increased work of breathing is present.  No  lymphadenopathy about the head, neck, or axilla noted.   Eyes: No conjunctival inflammation or lid edema is present.  Ears:  External ear exam shows no significant lesions or deformities.  Otoscopic examination reveals clear canals, tympanic membranes are intact bilaterally without bulging, retraction, inflammation or discharge.  Nose:  External nasal examination shows no deformity or inflammation. Nasal mucosa are pink and moist without lesions or exudates. No septal dislocation or deviation.No obstruction to airflow.   Oral exam: Dental hygiene is good; lips and gums are healthy appearing.There is no oropharyngeal erythema or exudate noted.     Heart:  Normal rate and regular rhythm. S1 and S2 normal without gallop, murmur, click, rub  .S4.   Lungs: Chest is clear except for very soft musical inspiratory wheeze in the right upper lobe posteriorly.No increased work of breathing. She has an intermittent parasagittal cough which is loose but not productive    Extremities:  No cyanosis, edema, or clubbing  noted    Skin: Warm & dry w/o jaundice or tenting.          Assessment & Plan:  #1 bronchitis with isolated low-grade wheeze right upper lobe. Rule out atypical pneumonitis. She is on ACE inhibitor but this cough does not sound like that which would be expected due to increased bradykinen in the airway. Reflux does not appear to be playing a role.  Plan see orders & recommendations.

## 2011-05-13 ENCOUNTER — Encounter: Payer: Self-pay | Admitting: Internal Medicine

## 2011-05-16 LAB — URINE CULTURE: Colony Count: 30000

## 2011-06-30 ENCOUNTER — Other Ambulatory Visit (HOSPITAL_BASED_OUTPATIENT_CLINIC_OR_DEPARTMENT_OTHER): Payer: Self-pay | Admitting: Orthopedic Surgery

## 2011-06-30 DIAGNOSIS — R52 Pain, unspecified: Secondary | ICD-10-CM

## 2011-07-01 ENCOUNTER — Other Ambulatory Visit (HOSPITAL_BASED_OUTPATIENT_CLINIC_OR_DEPARTMENT_OTHER): Payer: Self-pay | Admitting: Orthopedic Surgery

## 2011-07-01 DIAGNOSIS — M25561 Pain in right knee: Secondary | ICD-10-CM

## 2011-07-02 ENCOUNTER — Other Ambulatory Visit (HOSPITAL_BASED_OUTPATIENT_CLINIC_OR_DEPARTMENT_OTHER): Payer: Self-pay

## 2011-07-02 ENCOUNTER — Ambulatory Visit (HOSPITAL_BASED_OUTPATIENT_CLINIC_OR_DEPARTMENT_OTHER)
Admission: RE | Admit: 2011-07-02 | Discharge: 2011-07-02 | Disposition: A | Discharge status: Home / Self Care | Payer: BC Managed Care – PPO | Source: Ambulatory Visit | Attending: Orthopedic Surgery | Admitting: Orthopedic Surgery

## 2011-07-02 DIAGNOSIS — M25561 Pain in right knee: Secondary | ICD-10-CM

## 2011-07-02 DIAGNOSIS — M25669 Stiffness of unspecified knee, not elsewhere classified: Secondary | ICD-10-CM | POA: Insufficient documentation

## 2011-07-02 DIAGNOSIS — M23329 Other meniscus derangements, posterior horn of medial meniscus, unspecified knee: Secondary | ICD-10-CM | POA: Insufficient documentation

## 2011-07-02 DIAGNOSIS — M7120 Synovial cyst of popliteal space [Baker], unspecified knee: Secondary | ICD-10-CM | POA: Insufficient documentation

## 2011-07-02 DIAGNOSIS — M25569 Pain in unspecified knee: Secondary | ICD-10-CM | POA: Insufficient documentation

## 2011-07-07 ENCOUNTER — Telehealth: Payer: Self-pay | Admitting: Internal Medicine

## 2011-07-07 NOTE — Telephone Encounter (Signed)
Patient received an MRI and would like to know if Kendra Leonard., can giver her the results can call patient on cell [phone]

## 2011-07-07 NOTE — Telephone Encounter (Signed)
Called patient at number given, lmom to call & schedule OV with hopper before he could review

## 2011-07-07 NOTE — Telephone Encounter (Signed)
Per Dr.Hopper if he was not the ordering MD (which he was not) patient needs an OV to discuss

## 2011-07-10 ENCOUNTER — Other Ambulatory Visit: Payer: Self-pay | Admitting: Internal Medicine

## 2011-07-11 ENCOUNTER — Other Ambulatory Visit: Payer: Self-pay | Admitting: Internal Medicine

## 2011-07-11 MED ORDER — FLUTICASONE PROPIONATE 50 MCG/ACT NA SUSP: 1.0000 | Freq: Two times a day (BID) | NASAL | Status: DC | PRN

## 2011-07-11 NOTE — Telephone Encounter (Signed)
Rx sent 

## 2011-07-11 NOTE — Telephone Encounter (Signed)
refill fluticasone prop nasal spray, Requesting a 90-day supply to be sent to [facility]  Wt/4-refills  Last OV 4.11.13 Last wrt 3.27.13

## 2011-07-14 ENCOUNTER — Ambulatory Visit (INDEPENDENT_AMBULATORY_CARE_PROVIDER_SITE_OTHER): Payer: BC Managed Care – PPO | Admitting: Internal Medicine

## 2011-07-14 ENCOUNTER — Encounter: Payer: Self-pay | Admitting: Internal Medicine

## 2011-07-14 VITALS — BP 126/80 | HR 81 | Temp 98.2°F | Resp 16 | Wt 142.8 lb

## 2011-07-14 DIAGNOSIS — S83249A Other tear of medial meniscus, current injury, unspecified knee, initial encounter: Secondary | ICD-10-CM

## 2011-07-14 DIAGNOSIS — IMO0002 Reserved for concepts with insufficient information to code with codable children: Secondary | ICD-10-CM

## 2011-07-14 NOTE — Patient Instructions (Addendum)
Share results with Orthopedic specialist .Consider glucosamine sulfate 1500 mg daily for joint symptoms. Take this daily  for 3 months and then leave it off for 2 months. This will rehydrate the cartilages.

## 2011-07-14 NOTE — Progress Notes (Signed)
  Subjective:    Patient ID: Kendra Leonard, female    DOB: Jun 30, 1950, 61 y.o.   MRN: [id number]  HPI After walking May 18 for an hour and hiking in the mountains May 25 she experienced constant, non radiating  pain in the right knee with compromised ambulation. She has had improvement with "time and rest".  She questioned whether this might be related to Baker's cyst diagnosed several years ago.The Orthopedist was concerned with other causes. MRI was performed 6/1. This revealed degenerative disease around the patellofemoral and medial compartments. There was a large Baker's cyst 2.4 2.2 x 6.5 cm. Is also an oblique tear of the posterior horn and posterior aspect of the body the medial meniscus with an associated para meniscal cyst formation. A small amount of fluid was seen about the medial collateral ligament suggesting sprain.    Review of Systems Constitutional: no fever, chills, sweats. Weight up not exercising Musculoskeletal:no  muscle cramps or pain. Posterior knee  joint stiffness; no redness, ? swelling Skin:no rash, color change Neuro: no weakness; incontinence (stool/urine); numbness and tingling Heme:no lymphadenopathy; abnormal bruising or bleeding       Objective:   Physical Exam   She appears healthy and well-nourished in  There is minor lordotic curve of the upper thoracic spine. Neck range of motion is normal  She has mild PIP osteoarthritic changes in hands  There is slight decreased flexion of the right knee. There may be slight asymmetry in the popliteal spaces with the right greater than the left; this is not dramatic.          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

## 2011-10-08 ENCOUNTER — Other Ambulatory Visit: Payer: Self-pay | Admitting: Internal Medicine

## 2011-10-11 ENCOUNTER — Other Ambulatory Visit: Payer: Self-pay | Admitting: Internal Medicine

## 2012-02-15 ENCOUNTER — Telehealth: Payer: Self-pay | Admitting: Internal Medicine

## 2012-02-16 NOTE — Telephone Encounter (Signed)
OK with me to transfer care to Dr. Arlyce Dice

## 2012-02-16 NOTE — Telephone Encounter (Signed)
Dr. Gessner do you agree with transfer? 

## 2012-02-16 NOTE — Telephone Encounter (Signed)
Dr. Arlyce Dice will you accept this patient?

## 2012-02-17 NOTE — Telephone Encounter (Signed)
ok 

## 2012-02-22 ENCOUNTER — Telehealth: Payer: Self-pay | Admitting: Gastroenterology

## 2012-02-22 NOTE — Telephone Encounter (Signed)
This patient switched care to Dr. Arlyce Dice. She was told by her PCP to call us and find out if she needs to come in sooner for her colonoscopy because polyps were removed the last time. She also wants to know if she can have a double procedure like before and if so does she need to come in for and OV?

## 2012-02-22 NOTE — Telephone Encounter (Signed)
Pt had colon in 2008 and per report "possible polyp" was removed. Per path report it was colorectal tissue that was not adenomatous or malignant. Recall date is 2018 in the computer. Pt is calling wanting to know if she needs to have the colon done sooner. Please advise.

## 2012-02-22 NOTE — Telephone Encounter (Signed)
Spoke to patient she is aware of change

## 2012-02-23 NOTE — Telephone Encounter (Signed)
No need for colon until 2018, in the absence of specific complaints. Needs OV if she thinks she needs EGD

## 2012-02-27 NOTE — Telephone Encounter (Signed)
Pt aware.

## 2012-03-15 ENCOUNTER — Other Ambulatory Visit: Payer: Self-pay | Admitting: Internal Medicine

## 2012-03-17 ENCOUNTER — Other Ambulatory Visit: Payer: Self-pay

## 2012-04-09 ENCOUNTER — Other Ambulatory Visit: Payer: Self-pay | Admitting: Internal Medicine

## 2012-05-30 ENCOUNTER — Encounter: Payer: Self-pay | Admitting: Internal Medicine

## 2012-05-30 ENCOUNTER — Ambulatory Visit (INDEPENDENT_AMBULATORY_CARE_PROVIDER_SITE_OTHER): Payer: BC Managed Care – PPO | Admitting: Internal Medicine

## 2012-05-30 VITALS — BP 122/78 | HR 84 | Temp 98.4°F | Resp 12 | Ht 64.0 in | Wt 142.0 lb

## 2012-05-30 DIAGNOSIS — R319 Hematuria, unspecified: Secondary | ICD-10-CM

## 2012-05-30 DIAGNOSIS — Z0000 Encounter for general adult medical examination without abnormal findings: Secondary | ICD-10-CM

## 2012-05-30 DIAGNOSIS — M1711 Unilateral primary osteoarthritis, right knee: Secondary | ICD-10-CM

## 2012-05-30 DIAGNOSIS — M1710 Unilateral primary osteoarthritis, unspecified knee: Secondary | ICD-10-CM

## 2012-05-30 DIAGNOSIS — IMO0002 Reserved for concepts with insufficient information to code with codable children: Secondary | ICD-10-CM

## 2012-05-30 DIAGNOSIS — R0789 Other chest pain: Secondary | ICD-10-CM

## 2012-05-30 LAB — LIPID PANEL
LDL Cholesterol: 106 mg/dL — ABNORMAL HIGH (ref 0–99)
Total CHOL/HDL Ratio: 4
VLDL: 22.4 mg/dL (ref 0.0–40.0)

## 2012-05-30 LAB — HEPATIC FUNCTION PANEL
ALT: 17 U/L (ref 0–35)
AST: 15 U/L (ref 0–37)
Bilirubin, Direct: 0 mg/dL (ref 0.0–0.3)
Total Bilirubin: 0.7 mg/dL (ref 0.3–1.2)

## 2012-05-30 LAB — BASIC METABOLIC PANEL
BUN: 11 mg/dL (ref 6–23)
Calcium: 9 mg/dL (ref 8.4–10.5)
Creatinine, Ser: 0.7 mg/dL (ref 0.4–1.2)
GFR: 84.01 mL/min (ref >60.00)
Potassium: 3.6 mEq/L (ref 3.5–5.1)

## 2012-05-30 LAB — CBC WITH DIFFERENTIAL/PLATELET
Eosinophils Relative: 1.2 % (ref 0.0–5.0)
Lymphocytes Relative: 28.1 % (ref 12.0–46.0)
Monocytes Relative: 5.9 % (ref 3.0–12.0)
Neutrophils Relative %: 64.5 % (ref 43.0–77.0)
Platelets: 219 10*3/uL (ref 150.0–400.0)
WBC: 5.4 10*3/uL (ref 4.5–10.5)

## 2012-05-30 NOTE — Progress Notes (Signed)
Subjective:    Patient ID: Kendra Leonard, female    DOB: 06-03-1950, 62 y.o.   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     Review of Systems She describes intermittent sharp right or left chest pain without specific trigger or relieving factor. It disappears after a few minutes and is nonexertional and nonradiating. She has a past history of reflux esophagitis for which endoscopy in 2008. She denies associated hoarseness, dysphagia, abdominal pain, unexplained weight loss, melena, or rectal bleeding. She had taken meloxicam as needed for the degenerative joint disease. She also had an intra-articular steroid injection in June 2013 by Dr. Despina Hick. She has initiated glucosamine prophylactically. She is not allergic to shellfish and has never taken medications for hypercholesterolemia.     Objective:   Physical Exam Gen.: Healthy and well-nourished in appearance. Alert, appropriate and cooperative throughout exam. Appears younger than stated age  Head: Normocephalic without obvious abnormalities Eyes: No corneal or conjunctival inflammation noted.  Extraocular motion intact. Vision grossly normal without lenses Ears: External  ear exam reveals no significant lesions or deformities. Canals clear .TMs normal. Hearing is grossly normal bilaterally. Nose: External nasal exam reveals no deformity or inflammation. Nasal mucosa are pink and moist. No lesions or exudates noted.  Mouth: Oral mucosa and oropharynx reveal no lesions or exudates. Teeth in good repair. Neck: No deformities, masses, or tenderness noted. Range of motion & Thyroid normal. Lungs: Normal respiratory effort; chest expands symmetrically. Lungs are clear to auscultation without rales, wheezes, or increased work of breathing. Heart: Normal rate and rhythm. Normal S1 and S2. No gallop, click, or  rub. No murmur. Abdomen: Bowel sounds normal; abdomen soft and nontender. No masses, organomegaly or hernias noted. Genitalia: As per Dr Melchor Amour                                  Musculoskeletal/extremities: No deformity or scoliosis noted of  the thoracic or lumbar spine.  No clubbing, cyanosis, edema, or significant extremity  deformity noted. Range of motion normal .Tone & strength  Normal. Joints reveal mild  DJD DIP changes. Nail health good. Able to lie down & sit up w/o help. Negative SLR bilaterally past 90 degrees. Mild crepitus of knees w/o effusion Vascular: Carotid, radial artery, dorsalis pedis and  posterior tibial pulses are full and equal. No bruits present. Neurologic: Alert and oriented x3. Deep tendon reflexes symmetrical and normal.      Skin: Intact without suspicious lesions or rashes. Lymph: No cervical, axillary lymphadenopathy present. Psych: Mood and affect are normal. Normally interactive                                                                                       Assessment & Plan:  #1 comprehensive physical exam; no acute findings  #2 atypical chest pain; most likely related to reflux and esophageal spasm.  #3 degenerative joint disease; glucosamine trial.   Plan: see Orders  &  Recommendations

## 2012-05-30 NOTE — Patient Instructions (Signed)
Reflux of gastric acid may be asymptomatic as this may occur mainly during sleep.The triggers for reflux  include stress; the "aspirin family" ; alcohol; peppermint; and caffeine (coffee, tea, cola, and chocolate). The aspirin family would include aspirin and the nonsteroidal agents such as ibuprofen &  Naproxen. Tylenol would not cause reflux. If having symptoms ; food & drink should be avoided for @ least 2 hours before going to bed.  Consider glucosamine sulfate 1500 mg daily for joint symptoms. Take this daily  for 3 months and then leave it off for 2 months. This will rehydrate the cartilages.  If you activate the  My Chart system; lab & Xray results will be released directly  to you as soon as I review & address these through the computer. If you choose not to sign up for My Chart within 36 hours of labs being drawn; results will be reviewed & interpretation added before being copied & mailed, causing a delay in getting the results to you.If you do not receive that report within 7-10 days ,please call. Additionally you can use this system to gain direct  access to your records  if  out of town or @ an office of a  physician who is not in  the My Chart network.  This improves continuity of care & places you in control of your medical record.

## 2012-05-31 LAB — POCT URINALYSIS DIPSTICK
Leukocytes, UA: NEGATIVE
Protein, UA: NEGATIVE
Spec Grav, UA: 1.015
Urobilinogen, UA: 0.2
pH, UA: 6.5

## 2012-05-31 NOTE — Addendum Note (Signed)
Addended by: Silvio Pate D on: 05/31/2012 05:05 PM   Modules accepted: Orders

## 2012-06-01 LAB — URINE CULTURE: Colony Count: >=100000

## 2012-07-12 ENCOUNTER — Telehealth: Payer: Self-pay | Admitting: Internal Medicine

## 2012-07-12 DIAGNOSIS — R040 Epistaxis: Secondary | ICD-10-CM

## 2012-07-12 NOTE — Telephone Encounter (Signed)
ENT on her plan or insurance co will make her pay out of pocket

## 2012-07-12 NOTE — Telephone Encounter (Signed)
Patient Information:  Caller Name: Raynelle Fanning  Phone: [phone]  Patient: Kendra Leonard, Kendra Leonard  Gender: Female  DOB: 1950/02/13  Age: 62 Years  PCP: Marga Melnick  Office Follow Up:  Does the office need to follow up with this patient?: Yes  Instructions For The Office: Seeking MD recommendation for office visit versus referral to ENT.  RN Note:  Bleeding lasted > 60 minutes.  Reports held pressure on nostrils, applied ice and nasal packing. History of bleeding disorder, "Purpura" as child. Explained need for evaluation and possible lab.  Reluctant to see PCP since was just seen 05/30/12; asking if should see ENT for nasal cautery or stop Flonase?  Please call back to advise MD recommednations.  Symptoms  Reason For Call & Symptoms: Spontaneous episodes of nosebleeds.  One happened during shower 06/04/12 and most recent one was 07/11/12 at 1555 while standing in line at store.  Uses Flonase daily.  Reviewed Health History In EMR: Yes  Reviewed Medications In EMR: Yes  Reviewed Allergies In EMR: Yes  Reviewed Surgeries / Procedures: Yes  Date of Onset of Symptoms: 06/04/2012  Treatments Tried: applied ice pack to stop bleeding, nasal packing, applied pressure  Treatments Tried Worked: Yes  Guideline(s) Used:  Nosebleed  Disposition Per Guideline:   See Today in Office  Reason For Disposition Reached:   Taking Coumadin (warfarin), Pradaxa (dabigatran), or known bleeding disorder (e.g., thrombocytopenia)  Advice Given:  N/A  RN Overrode Recommendation:  Follow Up With Office Later  Will schedule if Dr Alwyn Ren thinks it is necessary.

## 2012-07-12 NOTE — Telephone Encounter (Signed)
Patient is asking for Dr. Alwyn Ren to provide some names of ENT physicians he would recommend she go see.

## 2012-07-12 NOTE — Telephone Encounter (Signed)
Discuss with patient, referral placed 

## 2012-07-12 NOTE — Telephone Encounter (Signed)
Called Pt and advise her she would need OV in order to referred. Pt does not have current ENT provider but decline OV with [facility]. Pt states that she will call ENT to see if they would see her with out a referral. Pt denies any nose bleeding at this time.Please advise

## 2012-07-12 NOTE — Telephone Encounter (Signed)
ENT referral appropriate for severe episatxis

## 2012-07-23 ENCOUNTER — Other Ambulatory Visit: Payer: Self-pay | Admitting: Internal Medicine

## 2012-07-23 NOTE — Telephone Encounter (Signed)
Refill done.  

## 2012-08-16 ENCOUNTER — Encounter: Payer: Self-pay | Admitting: Internal Medicine

## 2012-10-17 ENCOUNTER — Other Ambulatory Visit: Payer: Self-pay | Admitting: Internal Medicine

## 2012-10-24 NOTE — Telephone Encounter (Signed)
Med filled.  

## 2012-10-25 ENCOUNTER — Ambulatory Visit: Payer: BC Managed Care – PPO

## 2012-10-26 ENCOUNTER — Other Ambulatory Visit: Payer: Self-pay | Admitting: Internal Medicine

## 2012-10-26 MED ORDER — OMEPRAZOLE 20 MG PO CPDR: DELAYED_RELEASE_CAPSULE | ORAL | Status: DC

## 2012-10-26 NOTE — Telephone Encounter (Signed)
Med filled.  

## 2012-11-01 ENCOUNTER — Ambulatory Visit (INDEPENDENT_AMBULATORY_CARE_PROVIDER_SITE_OTHER): Payer: BC Managed Care – PPO

## 2012-11-01 DIAGNOSIS — Z23 Encounter for immunization: Secondary | ICD-10-CM

## 2013-01-16 ENCOUNTER — Other Ambulatory Visit: Payer: Self-pay | Admitting: Internal Medicine

## 2013-01-16 NOTE — Telephone Encounter (Signed)
Benazepril and Omeprazole refilled per protocol. OV due. JG//CMA

## 2013-04-01 IMAGING — CR DG CHEST 2V
2 series · 2 of 2 positions shown · non-contrast
Comparison: None.

CLINICAL DATA: Cough, congestion

CHEST - 2 VIEW

[w chest pa]
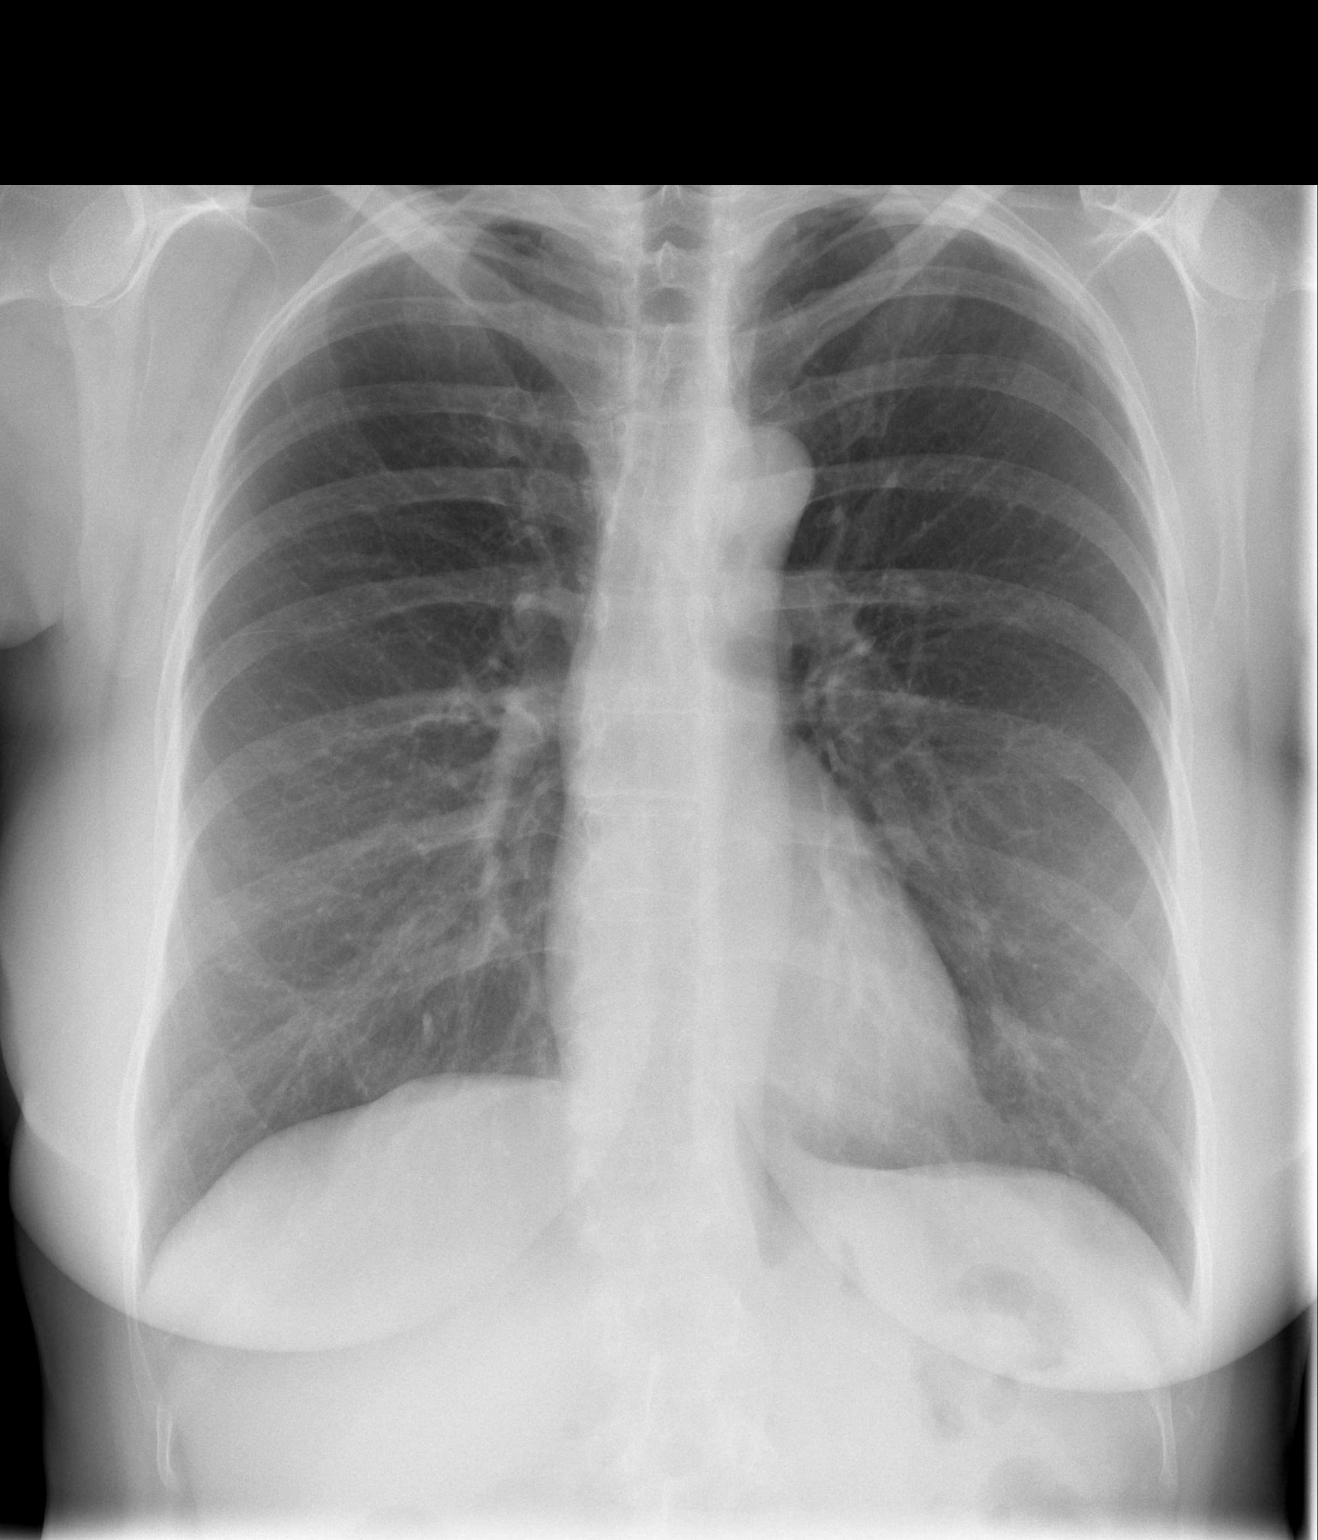

[w chest lat]
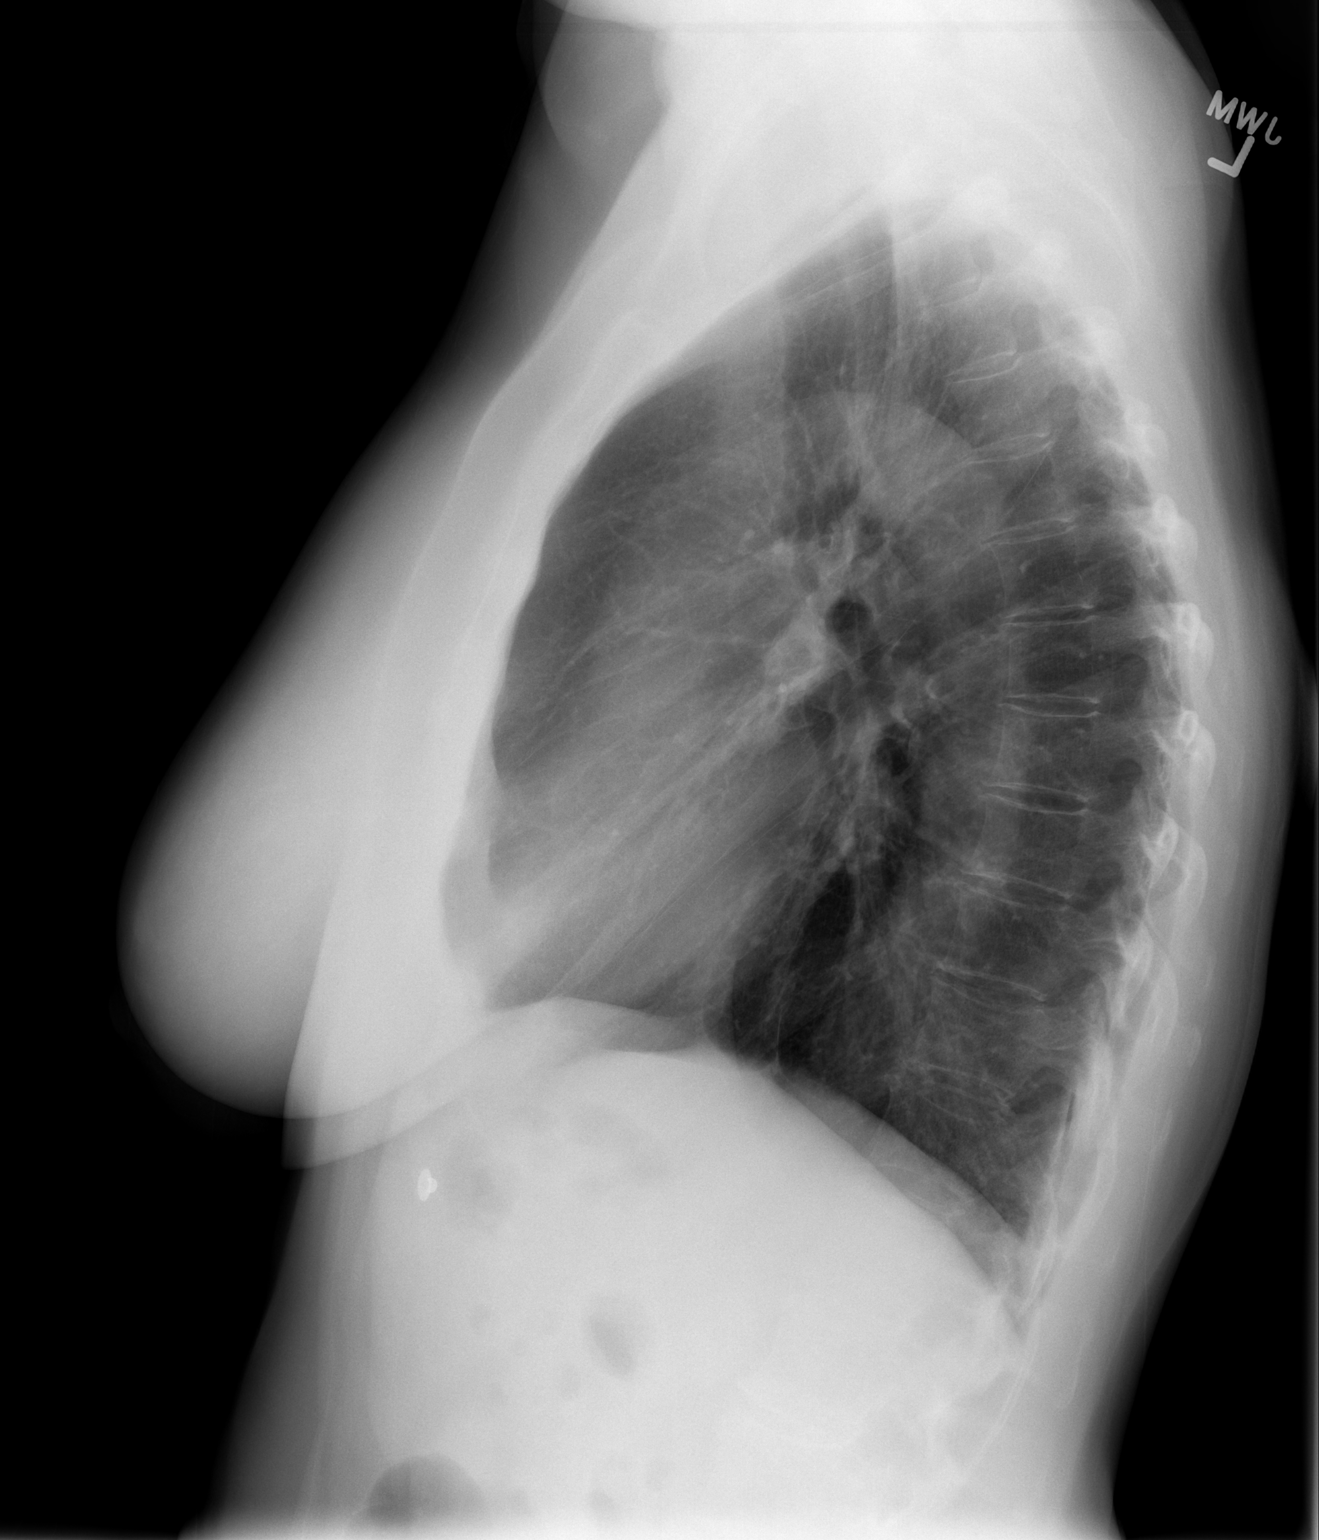

[2 of 2 positions shown; findings below may reference images not displayed]

FINDINGS: Lungs are clear. No pleural effusion or pneumothorax.

Cardiomediastinal silhouette is within normal limits.

Mild degenerative changes of the visualized thoracolumbar spine.
IMPRESSION: No evidence of acute cardiopulmonary disease.

## 2013-04-12 ENCOUNTER — Other Ambulatory Visit: Payer: Self-pay | Admitting: Internal Medicine

## 2013-04-15 MED ORDER — OMEPRAZOLE 20 MG PO CPDR: DELAYED_RELEASE_CAPSULE | ORAL | Status: DC

## 2013-04-15 MED ORDER — BENAZEPRIL HCL 40 MG PO TABS: ORAL_TABLET | ORAL | Status: DC

## 2013-04-15 NOTE — Telephone Encounter (Signed)
Rx sent to the pharmacy by e-script.//AB/CMA 

## 2013-04-24 ENCOUNTER — Other Ambulatory Visit: Payer: Self-pay | Admitting: *Deleted

## 2013-04-24 MED ORDER — OMEPRAZOLE 20 MG PO CPDR: DELAYED_RELEASE_CAPSULE | ORAL | Status: DC

## 2013-04-24 MED ORDER — BENAZEPRIL HCL 40 MG PO TABS: ORAL_TABLET | ORAL | Status: DC

## 2013-04-25 ENCOUNTER — Telehealth: Payer: Self-pay

## 2013-04-25 NOTE — Telephone Encounter (Signed)
Rx was filled on (04-24-13) and pt aware.//AB/CMA

## 2013-04-25 NOTE — Telephone Encounter (Signed)
The patient called and is hoping to get an rx of her blood pressure med called into the [facility] in [place].  She states the mail order is not processing her medication correctly   Callback - [phone]

## 2013-04-26 ENCOUNTER — Other Ambulatory Visit: Payer: Self-pay

## 2013-04-26 MED ORDER — OMEPRAZOLE 20 MG PO CPDR: DELAYED_RELEASE_CAPSULE | ORAL | Status: DC

## 2013-04-26 NOTE — Telephone Encounter (Signed)
rx refilled for prilosec 20 mg

## 2013-06-04 ENCOUNTER — Ambulatory Visit (INDEPENDENT_AMBULATORY_CARE_PROVIDER_SITE_OTHER): Payer: BC Managed Care – PPO | Admitting: Internal Medicine

## 2013-06-04 ENCOUNTER — Other Ambulatory Visit (INDEPENDENT_AMBULATORY_CARE_PROVIDER_SITE_OTHER): Payer: BC Managed Care – PPO

## 2013-06-04 ENCOUNTER — Encounter: Payer: Self-pay | Admitting: Internal Medicine

## 2013-06-04 VITALS — BP 122/86 | HR 73 | Temp 98.5°F | Resp 14 | Ht 63.5 in | Wt 137.8 lb

## 2013-06-04 DIAGNOSIS — Z0000 Encounter for general adult medical examination without abnormal findings: Secondary | ICD-10-CM

## 2013-06-04 LAB — CBC WITH DIFFERENTIAL/PLATELET
BASOS PCT: 0.5 % (ref 0.0–3.0)
Basophils Absolute: 0 10*3/uL (ref 0.0–0.1)
Eosinophils Absolute: 0.1 10*3/uL (ref 0.0–0.7)
Eosinophils Relative: 0.8 % (ref 0.0–5.0)
HEMATOCRIT: 38.6 % (ref 36.0–46.0)
HEMOGLOBIN: 12.9 g/dL (ref 12.0–15.0)
LYMPHS PCT: 27.3 % (ref 12.0–46.0)
Lymphs Abs: 1.9 10*3/uL (ref 0.7–4.0)
MCHC: 33.5 g/dL (ref 30.0–36.0)
MCV: 87.9 fl (ref 78.0–100.0)
MONOS PCT: 4.7 % (ref 3.0–12.0)
Monocytes Absolute: 0.3 10*3/uL (ref 0.1–1.0)
Neutro Abs: 4.7 10*3/uL (ref 1.4–7.7)
Neutrophils Relative %: 66.7 % (ref 43.0–77.0)
Platelets: 253 10*3/uL (ref 150.0–400.0)
RBC: 4.39 Mil/uL (ref 3.87–5.11)
RDW: 13.7 % (ref 11.5–15.5)
WBC: 7.1 10*3/uL (ref 4.0–10.5)

## 2013-06-04 LAB — URINALYSIS, ROUTINE W REFLEX MICROSCOPIC
BILIRUBIN URINE: NEGATIVE
Ketones, ur: NEGATIVE
NITRITE: NEGATIVE
SPECIFIC GRAVITY, URINE: 1.01 (ref 1.000–1.030)
TOTAL PROTEIN, URINE-UPE24: NEGATIVE
Urine Glucose: NEGATIVE
Urobilinogen, UA: 0.2 (ref 0.0–1.0)
pH: 7 (ref 5.0–8.0)

## 2013-06-04 LAB — LIPID PANEL
Cholesterol: 203 mg/dL — ABNORMAL HIGH (ref 0–200)
HDL: 56.4 mg/dL (ref >39.00)
LDL Cholesterol: 130 mg/dL — ABNORMAL HIGH (ref 0–99)
Total CHOL/HDL Ratio: 4
Triglycerides: 84 mg/dL (ref 0.0–149.0)
VLDL: 16.8 mg/dL (ref 0.0–40.0)

## 2013-06-04 LAB — HEPATIC FUNCTION PANEL
ALK PHOS: 80 U/L (ref 39–117)
ALT: 23 U/L (ref 0–35)
AST: 19 U/L (ref 0–37)
Albumin: 4.1 g/dL (ref 3.5–5.2)
Bilirubin, Direct: 0 mg/dL (ref 0.0–0.3)
TOTAL PROTEIN: 7.3 g/dL (ref 6.0–8.3)
Total Bilirubin: 0.5 mg/dL (ref 0.2–1.2)

## 2013-06-04 LAB — TSH: TSH: 1.34 u[IU]/mL (ref 0.35–4.50)

## 2013-06-04 MED ORDER — BENAZEPRIL HCL 40 MG PO TABS: ORAL_TABLET | ORAL | Status: DC

## 2013-06-04 MED ORDER — OMEPRAZOLE 20 MG PO CPDR: DELAYED_RELEASE_CAPSULE | ORAL | Status: DC

## 2013-06-04 NOTE — Patient Instructions (Addendum)
Your next office appointment will be determined based upon review of your pending labs & BMD. Those instructions will be transmitted to you through My Chart   Minimal Blood Pressure Goal= AVERAGE < 140/90;  Ideal is an AVERAGE < 135/85. This AVERAGE should be calculated from @ least 5-7 BP readings taken @ different times of day on different days of week. You should not respond to isolated BP readings , but rather the AVERAGE for that week .Please bring your  blood pressure cuff to office visits to verify that it is reliable.It  can also be checked against the blood pressure device at the pharmacy. Finger or wrist cuffs are not dependable; an arm cuff is. Reflux of gastric acid may be asymptomatic as this may occur mainly during sleep.The triggers for reflux  include stress; the "aspirin family" ; alcohol; peppermint; and caffeine (coffee, tea, cola, and chocolate). The aspirin family would include aspirin and the nonsteroidal agents such as ibuprofen &  Naproxen. Tylenol would not cause reflux. If having symptoms ; food & drink should be avoided for @ least 2 hours before going to bed.

## 2013-06-04 NOTE — Progress Notes (Signed)
Pre visit review using our clinic review tool, if applicable. No additional management support is needed unless otherwise documented below in the visit note. 

## 2013-06-04 NOTE — Progress Notes (Signed)
   Subjective:    Patient ID: Kendra Leonard, female    DOB: 1950-11-24, 63 y.o.   MRN: [id number]  HPI She  is here for a physical;acute issues of significance denied except chronic DJD of knee.  Blood pressure range / average : no monitor Compliant with anti hypertemsive medication. No lightheadedness or other adverse medication effect described.  A modified  heart healthy / no added salt diet is followed. Minimal exercise due to knee DJD  Review of Systems   Significant headaches, epistaxis, chest pain, palpitations, exertional dyspnea, claudication, paroxysmal nocturnal dyspnea, or edema absent.         Objective:   Physical Exam Gen.: Healthy and well-nourished in appearance. Alert, appropriate and cooperative throughout exam. Appears younger than stated age  Head: Normocephalic without obvious abnormalities  Eyes: No corneal or conjunctival inflammation noted. Pupils equal round reactive to light and accommodation. Extraocular motion intact.  Ears: External  ear exam reveals no significant lesions or deformities. Canals clear .TMs normal. Hearing is grossly normal bilaterally. Nose: External nasal exam reveals no deformity or inflammation. Nasal mucosa are pink and moist. No lesions or exudates noted.   Mouth: Oral mucosa and oropharynx reveal no lesions or exudates. Teeth in good repair. Neck: No deformities, masses, or tenderness noted. Range of motion normal. Thyroid asymmetric physiologically. Lungs: Normal respiratory effort; chest expands symmetrically. Lungs are clear to auscultation without rales, wheezes, or increased work of breathing. Heart: Normal rate and rhythm. Normal S1 and S2. No gallop, click, or rub. S4 w/o murmur. Abdomen: Bowel sounds normal; abdomen soft and nontender. No masses, organomegaly or hernias noted. Genitalia: as per Gyn                                  Musculoskeletal/extremities: No deformity or scoliosis noted of  the thoracic or lumbar  spine.  No clubbing, cyanosis, edema, or significant extremity  deformity noted. Range of motion normal .Tone & strength normal. Hand joints  reveal mild  DJD DIP changes. Crepitus of knees Fingernail / toenail health good. Able to lie down & sit up w/o help. Negative SLR bilaterally Vascular: Carotid, radial artery, dorsalis pedis and  posterior tibial pulses are full and equal. No bruits present. Neurologic: Alert and oriented x3. Deep tendon reflexes symmetrical and normal.  Gait normal  . Skin: Intact without suspicious lesions or rashes. Lymph: No cervical, axillary lymphadenopathy present. Psych: Mood and affect are normal. Normally interactive                                                                                        Assessment & Plan:  #1 comprehensive physical exam; no acute findings  Plan: see Orders  & Recommendations

## 2013-06-13 ENCOUNTER — Telehealth: Payer: Self-pay | Admitting: *Deleted

## 2013-06-13 NOTE — Telephone Encounter (Signed)
Please refill meds X 90 days with 3 refills & send copy of EKG. Thanks, [employer]

## 2013-06-13 NOTE — Telephone Encounter (Signed)
Pt called states she has misplaced her prescriptions and the EKG given to her upon OV discharge.  Pt is requesting items to be reprinted and mailed to her.  Please advise

## 2013-06-14 ENCOUNTER — Other Ambulatory Visit: Payer: Self-pay | Admitting: *Deleted

## 2013-06-14 MED ORDER — OMEPRAZOLE 20 MG PO CPDR: DELAYED_RELEASE_CAPSULE | ORAL | Status: DC

## 2013-06-14 MED ORDER — BENAZEPRIL HCL 40 MG PO TABS: ORAL_TABLET | ORAL | Status: DC

## 2013-06-14 NOTE — Telephone Encounter (Signed)
Rx and EKG printed, ready to be mailed.  Address verified by pt.

## 2013-06-18 ENCOUNTER — Encounter: Payer: Self-pay | Admitting: Internal Medicine

## 2013-10-21 ENCOUNTER — Encounter: Payer: Self-pay | Admitting: Internal Medicine

## 2013-10-25 NOTE — Telephone Encounter (Signed)
Pt called stated that she got a massage stated that Dr.Hopper need [facility] [phone] to review coverage, they are unable to process Rx for Omeprazole. Please call pt with the update, she is almost run out of this med and would like to know something.

## 2013-10-25 NOTE — Telephone Encounter (Signed)
I spoke to representative at [facility] and patient's case is being reviewed by the medical director. It should be about 42 hours before a decision is made. [facility] will notify the office by fax.

## 2013-10-25 NOTE — Telephone Encounter (Signed)
Prior authorization for Omeprazole has been submitted to patients insurance via covermymeds. Waiting on her insurance to respond.

## 2013-10-29 ENCOUNTER — Encounter: Payer: Self-pay | Admitting: Internal Medicine

## 2013-10-30 ENCOUNTER — Encounter: Payer: Self-pay | Admitting: Internal Medicine

## 2013-10-30 ENCOUNTER — Telehealth: Payer: Self-pay | Admitting: Internal Medicine

## 2013-10-30 NOTE — Telephone Encounter (Signed)
I spoke to [facility] regarding the appeal on Omeprazole. To appeal it has to be done in writing and mailed to :  Pharmacy Appeals Coordinator  Attention Appeals and Grievance [address] [place] [id number]

## 2013-10-30 NOTE — Telephone Encounter (Signed)
See previous patient email notes. Patient notified via my chart and Dr Linna Darner also notified that appeal needs to be completed in writing. Address is on previous message.

## 2013-10-30 NOTE — Telephone Encounter (Signed)
Message copied by Shelly Coss on Wed Oct 30, 2013  7:59 AM ------      Message from: Hendricks Limes      Created: Tue Oct 29, 2013  6:44 PM       Please appeal the decision concerning the  GERD medication. I reviewed her old medical records; she has documented erosive esophagitis an upper endoscopy in 2008 ------

## 2013-10-30 NOTE — Telephone Encounter (Signed)
Pt called and stated [facility] will not fill her script for Omeprazole (Prilosec) 20 mg.  Pt stated she needs [facility] to do an appeal with documentation to support the need for script.

## 2013-10-31 ENCOUNTER — Telehealth: Payer: Self-pay

## 2013-10-31 NOTE — Telephone Encounter (Signed)
Appeals letter has also been mailed to patient

## 2013-10-31 NOTE — Telephone Encounter (Signed)
Appeal letter has been mailed to:   Pharmacy [employer] and Grievance  [address] [place] [address]

## 2013-10-31 NOTE — Telephone Encounter (Signed)
Message copied by Shelly Coss on Thu Oct 31, 2013  8:22 AM ------      Message from: Hendricks Limes      Created: Wed Oct 30, 2013  6:23 PM       See appeal letter ------

## 2013-11-13 ENCOUNTER — Telehealth: Payer: Self-pay | Admitting: *Deleted

## 2013-11-13 NOTE — Telephone Encounter (Signed)
Notified pt yes letter was also mailed to the Pharmacy Appeal in [place].../lmb

## 2013-11-13 NOTE — Telephone Encounter (Signed)
Left msg on triage md wrote a appeal letter  Wanting to know did md mail letter to insurance company or anyone else beside her...Kendra Leonard

## 2013-11-22 ENCOUNTER — Ambulatory Visit (INDEPENDENT_AMBULATORY_CARE_PROVIDER_SITE_OTHER): Payer: BC Managed Care – PPO

## 2013-11-22 DIAGNOSIS — Z23 Encounter for immunization: Secondary | ICD-10-CM

## 2013-12-03 ENCOUNTER — Encounter: Payer: Self-pay | Admitting: Internal Medicine

## 2014-05-27 ENCOUNTER — Telehealth: Payer: Self-pay | Admitting: Internal Medicine

## 2014-05-27 NOTE — Telephone Encounter (Signed)
If no better with these maneuvers & Miralax; she needs OV

## 2014-05-27 NOTE — Telephone Encounter (Signed)
Phone call to patient and she has been advised. She has not tried Miralax yet so she will do that.

## 2014-05-27 NOTE — Telephone Encounter (Signed)
Phone call to patient. She has been advised of Dr Hopper's response. She states this has been going on several days and her stomach is swollen. She's tried enemas, laxatives, and colon cleanser.

## 2014-05-27 NOTE — Telephone Encounter (Signed)
Natural interventions to treat or prevent constipation would include drinking to thirst, up to 32 ounces of fluids daily; eating 7-9 servings of fresh fruits or vegetables a day; and increasing roughage in the diet such as whole grains. The OTC  fiber products (Example: Metamucil, etc) can be employed if these natural maneuvers do not correct the issue. Finally MiraLax every third day as needed is an option.

## 2014-05-27 NOTE — Telephone Encounter (Signed)
Patient called asking for advice on constipation. I advised that we need to get her in to be seen. She states that there isn't anything he can do, but perhaps that he will send her to gastro. She is asking for advice on what to do. Please call patient to advise

## 2014-06-12 ENCOUNTER — Other Ambulatory Visit (INDEPENDENT_AMBULATORY_CARE_PROVIDER_SITE_OTHER): Payer: BC Managed Care – PPO

## 2014-06-12 ENCOUNTER — Encounter: Payer: Self-pay | Admitting: Internal Medicine

## 2014-06-12 ENCOUNTER — Telehealth: Payer: Self-pay

## 2014-06-12 ENCOUNTER — Ambulatory Visit (INDEPENDENT_AMBULATORY_CARE_PROVIDER_SITE_OTHER): Payer: BC Managed Care – PPO | Admitting: Internal Medicine

## 2014-06-12 VITALS — BP 108/80 | HR 81 | Temp 98.2°F | Resp 12 | Ht 63.5 in | Wt 139.5 lb

## 2014-06-12 DIAGNOSIS — Z0189 Encounter for other specified special examinations: Secondary | ICD-10-CM

## 2014-06-12 DIAGNOSIS — Z0000 Encounter for general adult medical examination without abnormal findings: Secondary | ICD-10-CM

## 2014-06-12 DIAGNOSIS — R739 Hyperglycemia, unspecified: Secondary | ICD-10-CM | POA: Diagnosis not present

## 2014-06-12 DIAGNOSIS — Z23 Encounter for immunization: Secondary | ICD-10-CM | POA: Diagnosis not present

## 2014-06-12 LAB — TSH: TSH: 0.97 u[IU]/mL (ref 0.35–4.50)

## 2014-06-12 LAB — LIPID PANEL
Cholesterol: 188 mg/dL (ref 0–200)
HDL: 50 mg/dL (ref >39.00)
LDL Cholesterol: 119 mg/dL — ABNORMAL HIGH (ref 0–99)
NonHDL: 138
Total CHOL/HDL Ratio: 4
Triglycerides: 97 mg/dL (ref 0.0–149.0)
VLDL: 19.4 mg/dL (ref 0.0–40.0)

## 2014-06-12 LAB — HEPATIC FUNCTION PANEL
ALBUMIN: 4.3 g/dL (ref 3.5–5.2)
ALT: 20 U/L (ref 0–35)
AST: 19 U/L (ref 0–37)
Alkaline Phosphatase: 85 U/L (ref 39–117)
Bilirubin, Direct: 0.1 mg/dL (ref 0.0–0.3)
Total Bilirubin: 0.5 mg/dL (ref 0.2–1.2)
Total Protein: 7.6 g/dL (ref 6.0–8.3)

## 2014-06-12 LAB — BASIC METABOLIC PANEL
BUN: 12 mg/dL (ref 6–23)
CALCIUM: 9.7 mg/dL (ref 8.4–10.5)
CO2: 28 mEq/L (ref 19–32)
Chloride: 103 mEq/L (ref 96–112)
Creatinine, Ser: 0.76 mg/dL (ref 0.40–1.20)
GFR: 80.95 mL/min (ref >60.00)
Glucose, Bld: 104 mg/dL — ABNORMAL HIGH (ref 70–99)
Potassium: 4.1 mEq/L (ref 3.5–5.1)
Sodium: 138 mEq/L (ref 135–145)

## 2014-06-12 LAB — CBC WITH DIFFERENTIAL/PLATELET
BASOS PCT: 0.7 % (ref 0.0–3.0)
Basophils Absolute: 0 10*3/uL (ref 0.0–0.1)
EOS PCT: 2.2 % (ref 0.0–5.0)
Eosinophils Absolute: 0.1 10*3/uL (ref 0.0–0.7)
HCT: 39.5 % (ref 36.0–46.0)
HEMOGLOBIN: 13.4 g/dL (ref 12.0–15.0)
LYMPHS PCT: 31.3 % (ref 12.0–46.0)
Lymphs Abs: 1.4 10*3/uL (ref 0.7–4.0)
MCHC: 33.9 g/dL (ref 30.0–36.0)
MCV: 84.7 fl (ref 78.0–100.0)
MONOS PCT: 7 % (ref 3.0–12.0)
Monocytes Absolute: 0.3 10*3/uL (ref 0.1–1.0)
NEUTROS ABS: 2.6 10*3/uL (ref 1.4–7.7)
NEUTROS PCT: 58.8 % (ref 43.0–77.0)
Platelets: 256 10*3/uL (ref 150.0–400.0)
RBC: 4.67 Mil/uL (ref 3.87–5.11)
RDW: 13.4 % (ref 11.5–15.5)
WBC: 4.5 10*3/uL (ref 4.0–10.5)

## 2014-06-12 LAB — HEMOGLOBIN A1C: HEMOGLOBIN A1C: 5.8 % (ref 4.6–6.5)

## 2014-06-12 NOTE — Progress Notes (Signed)
Subjective:    Patient ID: Kendra Leonard, female    DOB: 07/02/1950, 64 y.o.   MRN: [id number]  HPI  She is here for a physical;acute issues include an episode of protracted constipation which lasted approximately 2 weeks. She used Dulcolax, Correctol, magnesium citrate, increased roughage, and a probiotic before she had resolution of the constipation and associated bloating. Her gynecologic follow-up is pending in the near future.  She is on a heart healthy diet. She's walking 3 times a week for 60 minutes without any cardio pulmonary symptoms.   She's been compliant with her medicines without adverse effects. She is not monitoring the blood pressure at home. There is no family history of premature heart attack or stroke.  Colonoscopy is up-to-date. She had a colon polyp in 2008.Other than the constipation she has no active GI symptoms.A P uncle had colon cancer  She has a history of microscopic hematuria. She has no bleeding dyscrasias at this time.    Review of Systems She does not have claudication but has knee pain which requires she use a brace on occasion.  Chest pain, palpitations, tachycardia, exertional dyspnea, paroxysmal nocturnal dyspnea,  or edema are absent.  Unexplained weight loss, abdominal pain, significant dyspepsia, dysphagia, melena, rectal bleeding, or persistently small caliber stools are denied.  Epistaxis, hemoptysis, hematuria, abnormal bruising , bleeding, or difficulty stopping bleeding with injury.    Objective:   Physical Exam Gen.: Adequately nourished in appearance. Alert, appropriate and cooperative throughout exam. Appears younger than stated age  Head: Normocephalic without obvious abnormalities  Eyes: No corneal or conjunctival inflammation noted. Pupils equal round reactive to light and accommodation. Extraocular motion intact.  Ears: External  ear exam reveals no significant lesions or deformities. Canals clear .TMs normal. Hearing is  grossly normal bilaterally. Nose: External nasal exam reveals no deformity or inflammation. Nasal mucosa are pink and moist. No lesions or exudates noted.   Mouth: Oral mucosa and oropharynx reveal no lesions or exudates. Teeth in good repair. Neck: No deformities, masses, or tenderness noted. Range of motion & Thyroid normal. Lungs: Normal respiratory effort; chest expands symmetrically. Lungs are clear to auscultation without rales, wheezes, or increased work of breathing. Heart: Normal rate and rhythm. Normal S1 and S2. No gallop, click, or rub. No murmur. Abdomen: Bowel sounds normal; abdomen soft and nontender. No masses, organomegaly or hernias noted. Genitalia: as per Gyn                                  Musculoskeletal/extremities: No deformity or scoliosis noted of  the thoracic or lumbar spine.  No clubbing, cyanosis, edema, or significant extremity  deformity noted.  Range of motion normal . Tone & strength normal. Hand joints reveal mild  DJD DIP changes.  Fingernail  health good. Crepitus of knees  Able to lie down & sit up w/o help.  Negative SLR bilaterally Vascular: Carotid, radial artery, dorsalis pedis and  posterior tibial pulses are full and equal. No bruits present. Neurologic: Alert and oriented x3. Deep tendon reflexes symmetrical and normal.  Gait normal       Skin: Intact without suspicious lesions or rashes. Lymph: No cervical, axillary lymphadenopathy present. Psych: Mood and affect are normal. Normally interactive  Assessment & Plan:  #1 comprehensive physical exam; no acute findings  Plan: see Orders  & Recommendations

## 2014-06-12 NOTE — Progress Notes (Signed)
Pre visit review using our clinic review tool, if applicable. No additional management support is needed unless otherwise documented below in the visit note. 

## 2014-06-12 NOTE — Telephone Encounter (Signed)
Request for add on has been faxed to lab 

## 2014-06-12 NOTE — Patient Instructions (Signed)
  Your next office appointment will be determined based upon review of your pending labs. Those instructions will be transmitted to you by My Chart Critical results will be called. Followup as needed for any active or acute issue.  Please report any significant change in your symptoms.  Minimal Blood Pressure Goal= AVERAGE < 140/90;  Ideal is an AVERAGE < 135/85. This AVERAGE should be calculated from @ least 5-7 BP readings taken @ different times of day on different days of week. You should not respond to isolated BP readings , but rather the AVERAGE for that week .Please bring your  blood pressure cuff to office visits to verify that it is reliable.It  can also be checked against the blood pressure device at the pharmacy. Finger or wrist cuffs are not dependable; an arm cuff is.

## 2014-06-12 NOTE — Telephone Encounter (Signed)
-----   Message from Hendricks Limes, MD sent at 06/12/2014  1:09 PM EDT ----- Please add A1c (R73.9)

## 2014-06-13 ENCOUNTER — Other Ambulatory Visit: Payer: Self-pay | Admitting: Internal Medicine

## 2014-06-13 DIAGNOSIS — Z780 Asymptomatic menopausal state: Secondary | ICD-10-CM

## 2014-06-17 ENCOUNTER — Ambulatory Visit (INDEPENDENT_AMBULATORY_CARE_PROVIDER_SITE_OTHER)
Admission: RE | Admit: 2014-06-17 | Discharge: 2014-06-17 | Disposition: A | Discharge status: Home / Self Care | Payer: BC Managed Care – PPO | Source: Ambulatory Visit | Attending: Internal Medicine | Admitting: Internal Medicine

## 2014-06-17 DIAGNOSIS — Z780 Asymptomatic menopausal state: Secondary | ICD-10-CM

## 2014-06-24 ENCOUNTER — Encounter: Payer: Self-pay | Admitting: Internal Medicine

## 2014-07-29 ENCOUNTER — Other Ambulatory Visit: Payer: Self-pay | Admitting: Emergency Medicine

## 2014-07-29 MED ORDER — OMEPRAZOLE 20 MG PO CPDR: DELAYED_RELEASE_CAPSULE | ORAL | Status: DC

## 2014-07-29 MED ORDER — BENAZEPRIL HCL 40 MG PO TABS: ORAL_TABLET | ORAL | Status: DC

## 2014-08-08 ENCOUNTER — Encounter: Payer: Self-pay | Admitting: Internal Medicine

## 2014-08-11 ENCOUNTER — Other Ambulatory Visit: Payer: Self-pay | Admitting: Emergency Medicine

## 2014-08-11 ENCOUNTER — Telehealth: Payer: Self-pay | Admitting: Emergency Medicine

## 2014-08-11 MED ORDER — OMEPRAZOLE 20 MG PO CPDR: DELAYED_RELEASE_CAPSULE | ORAL | Status: DC

## 2014-08-11 NOTE — Telephone Encounter (Signed)
PA not needed for Omeprazole

## 2014-08-11 NOTE — Telephone Encounter (Signed)
PA for Omeprazole has been started.

## 2014-11-03 ENCOUNTER — Ambulatory Visit (INDEPENDENT_AMBULATORY_CARE_PROVIDER_SITE_OTHER): Payer: BC Managed Care – PPO | Admitting: Internal Medicine

## 2014-11-03 ENCOUNTER — Encounter: Payer: Self-pay | Admitting: Internal Medicine

## 2014-11-03 VITALS — BP 136/88 | HR 93 | Temp 99.0°F | Resp 16 | Ht 64.0 in | Wt 140.0 lb

## 2014-11-03 DIAGNOSIS — Z23 Encounter for immunization: Secondary | ICD-10-CM | POA: Diagnosis not present

## 2014-11-03 DIAGNOSIS — K2100 Gastro-esophageal reflux disease with esophagitis, without bleeding: Secondary | ICD-10-CM

## 2014-11-03 DIAGNOSIS — K208 Other esophagitis without bleeding: Secondary | ICD-10-CM

## 2014-11-03 DIAGNOSIS — K589 Irritable bowel syndrome without diarrhea: Secondary | ICD-10-CM

## 2014-11-03 NOTE — Patient Instructions (Addendum)
Natural interventions to treat or prevent constipation would include drinking to thirst, up to 32 ounces of fluids daily; eating 7-9 servings of fresh fruits or vegetables a day; and increasing roughage in the diet such as whole grains. The OTC  fiber products (Example: Metamucil, etc) can be employed if these natural maneuvers do not correct the issue. Finally MiraLax every third day as needed is an option. The GI referral will be scheduled and you'll be notified of the time.Please call the Referral Co-Ordinator @ [phone] if you have not been notified of appointment time within 7-10 days.  Reflux of gastric acid may be asymptomatic as this may occur mainly during sleep.The triggers for reflux  include stress; the "aspirin family" ; alcohol; peppermint; and caffeine (coffee, tea, cola, and chocolate). The aspirin family would include aspirin and the nonsteroidal agents such as ibuprofen &  Naproxen. Tylenol would not cause reflux. If having symptoms ; food & drink should be avoided for @ least 2 hours before going to bed.

## 2014-11-03 NOTE — Progress Notes (Signed)
   Subjective:    Patient ID: Kendra Leonard, female    DOB: 01/25/1951, 64 y.o.   MRN: [id number]  HPI She has had significant issues since 10/23/14 after eating muscadine grapes. The next morning she awoke with pain and swelling in abdomen. She took a probiotic and was unable to have a bowel movement. The next day she took one IBS and 2 Dulcolax tablets but the abdominal discomfort persisted. She also has had associated hot sweats.  9/25 she had pinto beans at a local restaurant and did have stool elimination. She again took a probiotic. The next day she took 2 colon cleansers that time.  9/27 she ingested apples and took 2 colon cleansers in the morning and 2 at bedtime and had scant stool production.  The next day with salad ingestion she did have scant stool as well.  Despite the absence of loose stool she continues to take a probiotic every day.  She's continued to have the gas pains.  3 weeks ago she had severe dysphagia after eating steak. She's had 3 minor episodes since.  She is fearful she has colon cancer and wants to be seen at [facility] by Dr. Koleen Distance.  Her paternal uncle did have colon cancer. Her brother and father have GERD.  Colonoscopy was performed 12/22/2006; polypoid colorectal mucosa present on biopsy. There was no adenomatous change. Upper endoscopy the same day revealed mild erosive esophagitis due to GERD.   Review of Systems Unexplained weight loss, melena, rectal bleeding, or persistently small caliber stools are denied.    Objective:   Physical Exam  General appearance is one of good health and nourishment w/o distress.  Eyes: No conjunctival inflammation or scleral icterus is present.  Oral exam: Dental hygiene is good; lips and gums are healthy appearing.There is no oropharyngeal erythema or exudate noted.   Heart:  Normal rate and regular rhythm. S1 and S2 normal without gallop, murmur, click, rub or other extra sounds      Lungs:Chest clear to auscultation; no wheezes, rhonchi,rales ,or rubs present.No increased work of breathing.   Abdomen: bowel sounds normal, soft and non-tender without masses, organomegaly or hernias noted.  No guarding or rebound .  Musculoskeletal: Able to lie flat and sit up without help. Negative straight leg raising bilaterally. Gait normal  Skin:Warm & dry.  Intact without suspicious lesions or rashes ; no jaundice or tenting  Lymphatic: No lymphadenopathy is noted about the head, neck, axilla       Assessment & Plan:  #1 constipation predominant irritable bowel syndrome.  #2 history of GERD with erosive esophagitis  Plan: Referral be made to [facility]. She's been advised to stop the probioticunless she's having loose stool.

## 2014-11-03 NOTE — Progress Notes (Signed)
Pre visit review using our clinic review tool, if applicable. No additional management support is needed unless otherwise documented below in the visit note. 

## 2014-11-04 ENCOUNTER — Telehealth: Payer: Self-pay | Admitting: Internal Medicine

## 2014-11-04 NOTE — Telephone Encounter (Signed)
Patient is wanting referral for [facility] as soon as possible.  She wants to make appointment herself with them.  FYI

## 2014-11-20 NOTE — Telephone Encounter (Signed)
Pt has been scheduled.  °

## 2014-12-02 ENCOUNTER — Encounter: Payer: Self-pay | Admitting: Internal Medicine

## 2014-12-03 ENCOUNTER — Telehealth: Payer: Self-pay | Admitting: Emergency Medicine

## 2014-12-03 NOTE — Telephone Encounter (Signed)
Mailed CT scan results to pt.

## 2014-12-03 NOTE — Telephone Encounter (Signed)
-----   Message from Hendricks Limes, MD sent at 12/03/2014 11:21 AM EDT ----- Please mail copy of 04/28/09 CT ordered by Dr Helane Rima. Thanks, Linus Orn

## 2015-06-10 ENCOUNTER — Other Ambulatory Visit (INDEPENDENT_AMBULATORY_CARE_PROVIDER_SITE_OTHER): Payer: BC Managed Care – PPO

## 2015-06-10 ENCOUNTER — Encounter: Payer: Self-pay | Admitting: Internal Medicine

## 2015-06-10 ENCOUNTER — Ambulatory Visit (INDEPENDENT_AMBULATORY_CARE_PROVIDER_SITE_OTHER): Payer: BC Managed Care – PPO | Admitting: Internal Medicine

## 2015-06-10 VITALS — BP 116/84 | HR 75 | Temp 97.4°F | Resp 16 | Ht 64.0 in | Wt 144.0 lb

## 2015-06-10 DIAGNOSIS — K2100 Gastro-esophageal reflux disease with esophagitis, without bleeding: Secondary | ICD-10-CM

## 2015-06-10 DIAGNOSIS — Z23 Encounter for immunization: Secondary | ICD-10-CM | POA: Diagnosis not present

## 2015-06-10 DIAGNOSIS — Z1159 Encounter for screening for other viral diseases: Secondary | ICD-10-CM | POA: Diagnosis not present

## 2015-06-10 DIAGNOSIS — Z0000 Encounter for general adult medical examination without abnormal findings: Secondary | ICD-10-CM

## 2015-06-10 DIAGNOSIS — I10 Essential (primary) hypertension: Secondary | ICD-10-CM

## 2015-06-10 LAB — COMPREHENSIVE METABOLIC PANEL
ALBUMIN: 4.6 g/dL (ref 3.5–5.2)
ALT: 13 U/L (ref 0–35)
AST: 14 U/L (ref 0–37)
Alkaline Phosphatase: 86 U/L (ref 39–117)
BUN: 16 mg/dL (ref 6–23)
CHLORIDE: 103 meq/L (ref 96–112)
CO2: 27 mEq/L (ref 19–32)
Calcium: 9.7 mg/dL (ref 8.4–10.5)
Creatinine, Ser: 0.73 mg/dL (ref 0.40–1.20)
GFR: 84.55 mL/min (ref >60.00)
Glucose, Bld: 105 mg/dL — ABNORMAL HIGH (ref 70–99)
POTASSIUM: 4.3 meq/L (ref 3.5–5.1)
SODIUM: 139 meq/L (ref 135–145)
Total Bilirubin: 0.4 mg/dL (ref 0.2–1.2)
Total Protein: 7.6 g/dL (ref 6.0–8.3)

## 2015-06-10 LAB — CBC WITH DIFFERENTIAL/PLATELET
BASOS PCT: 0.5 % (ref 0.0–3.0)
Basophils Absolute: 0 10*3/uL (ref 0.0–0.1)
Eosinophils Absolute: 0.1 10*3/uL (ref 0.0–0.7)
Eosinophils Relative: 1.4 % (ref 0.0–5.0)
HCT: 39.5 % (ref 36.0–46.0)
HEMOGLOBIN: 13.3 g/dL (ref 12.0–15.0)
LYMPHS ABS: 1.8 10*3/uL (ref 0.7–4.0)
Lymphocytes Relative: 31.7 % (ref 12.0–46.0)
MCHC: 33.8 g/dL (ref 30.0–36.0)
MCV: 85.9 fl (ref 78.0–100.0)
MONO ABS: 0.3 10*3/uL (ref 0.1–1.0)
Monocytes Relative: 5.6 % (ref 3.0–12.0)
NEUTROS ABS: 3.5 10*3/uL (ref 1.4–7.7)
NEUTROS PCT: 60.8 % (ref 43.0–77.0)
Platelets: 213 10*3/uL (ref 150.0–400.0)
RBC: 4.6 Mil/uL (ref 3.87–5.11)
RDW: 13.6 % (ref 11.5–15.5)
WBC: 5.8 10*3/uL (ref 4.0–10.5)

## 2015-06-10 LAB — LIPID PANEL
CHOLESTEROL: 199 mg/dL (ref 0–200)
HDL: 61.6 mg/dL (ref >39.00)
LDL Cholesterol: 119 mg/dL — ABNORMAL HIGH (ref 0–99)
NONHDL: 137.14
TRIGLYCERIDES: 90 mg/dL (ref 0.0–149.0)
Total CHOL/HDL Ratio: 3
VLDL: 18 mg/dL (ref 0.0–40.0)

## 2015-06-10 LAB — TSH: TSH: 1.46 u[IU]/mL (ref 0.35–4.50)

## 2015-06-10 LAB — HEMOGLOBIN A1C: HEMOGLOBIN A1C: 5.8 % (ref 4.6–6.5)

## 2015-06-10 MED ORDER — OMEPRAZOLE 20 MG PO CPDR: DELAYED_RELEASE_CAPSULE | ORAL | Status: DC

## 2015-06-10 MED ORDER — BENAZEPRIL HCL 40 MG PO TABS: ORAL_TABLET | ORAL | Status: DC

## 2015-06-10 NOTE — Assessment & Plan Note (Signed)
BP well controlled Current regimen effective and well tolerated Continue current medications at current doses  

## 2015-06-10 NOTE — Progress Notes (Signed)
Pre visit review using our clinic review tool, if applicable. No additional management support is needed unless otherwise documented below in the visit note. 

## 2015-06-10 NOTE — Patient Instructions (Addendum)
Start taking zantac at night for your heartburn.    Test(s) ordered today. Your results will be released to [place] (or called to you) after review, usually within 72hours after test completion. If any changes need to be made, you will be notified at that same time.  All other Health Maintenance issues reviewed.   All recommended immunizations and age-appropriate screenings are up-to-date or discussed.  No immunizations administered today.   Medications reviewed and updated.  Changes include trying the zantac at night for your heartburn.    Your prescription(s) have been submitted to your pharmacy. Please take as directed and contact our office if you believe you are having problem(s) with the medication(s).   Please followup in one year  Health Maintenance, Female Adopting a healthy lifestyle and getting preventive care can go a long way to promote health and wellness. Talk with your health care provider about what schedule of regular examinations is right for you. This is a good chance for you to check in with your provider about disease prevention and staying healthy. In between checkups, there are plenty of things you can do on your own. Experts have done a lot of research about which lifestyle changes and preventive measures are most likely to keep you healthy. Ask your health care provider for more information. WEIGHT AND DIET  Eat a healthy diet  Be sure to include plenty of vegetables, fruits, low-fat dairy products, and lean protein.  Do not eat a lot of foods high in solid fats, added sugars, or salt.  Get regular exercise. This is one of the most important things you can do for your health.  Most adults should exercise for at least 150 minutes each week. The exercise should increase your heart rate and make you sweat (moderate-intensity exercise).  Most adults should also do strengthening exercises at least twice a week. This is in addition to the moderate-intensity  exercise.  Maintain a healthy weight  Body mass index (BMI) is a measurement that can be used to identify possible weight problems. It estimates body fat based on height and weight. Your health care provider can help determine your BMI and help you achieve or maintain a healthy weight.  For females 4 years of age and older:   A BMI below 18.5 is considered underweight.  A BMI of 18.5 to 24.9 is normal.  A BMI of 25 to 29.9 is considered overweight.  A BMI of 30 and above is considered obese.  Watch levels of cholesterol and blood lipids  You should start having your blood tested for lipids and cholesterol at 67 years of age, then have this test every 5 years.  You may need to have your cholesterol levels checked more often if:  Your lipid or cholesterol levels are high.  You are older than 67 years of age.  You are at high risk for heart disease.  CANCER SCREENING   Lung Cancer  Lung cancer screening is recommended for adults 70-45 years old who are at high risk for lung cancer because of a history of smoking.  A yearly low-dose CT scan of the lungs is recommended for people who:  Currently smoke.  Have quit within the past 15 years.  Have at least a 30-pack-year history of smoking. A pack year is smoking an average of one pack of cigarettes a day for 1 year.  Yearly screening should continue until it has been 15 years since you quit.  Yearly screening should stop if you  develop a health problem that would prevent you from having lung cancer treatment.  Breast Cancer  Practice breast self-awareness. This means understanding how your breasts normally appear and feel.  It also means doing regular breast self-exams. Let your health care provider know about any changes, no matter how small.  If you are in your 20s or 30s, you should have a clinical breast exam (CBE) by a health care provider every 1-3 years as part of a regular health exam.  If you are 8 or  older, have a CBE every year. Also consider having a breast X-ray (mammogram) every year.  If you have a family history of breast cancer, talk to your health care provider about genetic screening.  If you are at high risk for breast cancer, talk to your health care provider about having an MRI and a mammogram every year.  Breast cancer gene (BRCA) assessment is recommended for women who have family members with BRCA-related cancers. BRCA-related cancers include:  Breast.  Ovarian.  Tubal.  Peritoneal cancers.  Results of the assessment will determine the need for genetic counseling and BRCA1 and BRCA2 testing. Cervical Cancer Your health care provider may recommend that you be screened regularly for cancer of the pelvic organs (ovaries, uterus, and vagina). This screening involves a pelvic examination, including checking for microscopic changes to the surface of your cervix (Pap test). You may be encouraged to have this screening done every 3 years, beginning at age 58.  For women ages 77-65, health care providers may recommend pelvic exams and Pap testing every 3 years, or they may recommend the Pap and pelvic exam, combined with testing for human papilloma virus (HPV), every 5 years. Some types of HPV increase your risk of cervical cancer. Testing for HPV may also be done on women of any age with unclear Pap test results.  Other health care providers may not recommend any screening for nonpregnant women who are considered low risk for pelvic cancer and who do not have symptoms. Ask your health care provider if a screening pelvic exam is right for you.  If you have had past treatment for cervical cancer or a condition that could lead to cancer, you need Pap tests and screening for cancer for at least 20 years after your treatment. If Pap tests have been discontinued, your risk factors (such as having a new sexual partner) need to be reassessed to determine if screening should resume. Some  women have medical problems that increase the chance of getting cervical cancer. In these cases, your health care provider may recommend more frequent screening and Pap tests. Colorectal Cancer  This type of cancer can be detected and often prevented.  Routine colorectal cancer screening usually begins at 67 years of age and continues through 67 years of age.  Your health care provider may recommend screening at an earlier age if you have risk factors for colon cancer.  Your health care provider may also recommend using home test kits to check for hidden blood in the stool.  A small camera at the end of a tube can be used to examine your colon directly (sigmoidoscopy or colonoscopy). This is done to check for the earliest forms of colorectal cancer.  Routine screening usually begins at age 65.  Direct examination of the colon should be repeated every 5-10 years through 67 years of age. However, you may need to be screened more often if early forms of precancerous polyps or small growths are found. Skin Cancer  Check your skin from head to toe regularly.  Tell your health care provider about any new moles or changes in moles, especially if there is a change in a mole's shape or color.  Also tell your health care provider if you have a mole that is larger than the size of a pencil eraser.  Always use sunscreen. Apply sunscreen liberally and repeatedly throughout the day.  Protect yourself by wearing long sleeves, pants, a wide-brimmed hat, and sunglasses whenever you are outside. HEART DISEASE, DIABETES, AND HIGH BLOOD PRESSURE   High blood pressure causes heart disease and increases the risk of stroke. High blood pressure is more likely to develop in:  People who have blood pressure in the high end of the normal range (130-139/85-89 mm Hg).  People who are overweight or obese.  People who are African American.  If you are 45-68 years of age, have your blood pressure checked every  3-5 years. If you are 82 years of age or older, have your blood pressure checked every year. You should have your blood pressure measured twice--once when you are at a hospital or clinic, and once when you are not at a hospital or clinic. Record the average of the two measurements. To check your blood pressure when you are not at a hospital or clinic, you can use:  An automated blood pressure machine at a pharmacy.  A home blood pressure monitor.  If you are between 40 years and 24 years old, ask your health care provider if you should take aspirin to prevent strokes.  Have regular diabetes screenings. This involves taking a blood sample to check your fasting blood sugar level.  If you are at a normal weight and have a low risk for diabetes, have this test once every three years after 67 years of age.  If you are overweight and have a high risk for diabetes, consider being tested at a younger age or more often. PREVENTING INFECTION  Hepatitis B  If you have a higher risk for hepatitis B, you should be screened for this virus. You are considered at high risk for hepatitis B if:  You were born in a country where hepatitis B is common. Ask your health care provider which countries are considered high risk.  Your parents were born in a high-risk country, and you have not been immunized against hepatitis B (hepatitis B vaccine).  You have HIV or AIDS.  You use needles to inject street drugs.  You live with someone who has hepatitis B.  You have had sex with someone who has hepatitis B.  You get hemodialysis treatment.  You take certain medicines for conditions, including cancer, organ transplantation, and autoimmune conditions. Hepatitis C  Blood testing is recommended for:  Everyone born from [birth year] through 1967.  Anyone with known risk factors for hepatitis C. Sexually transmitted infections (STIs)  You should be screened for sexually transmitted infections (STIs) including  gonorrhea and chlamydia if:  You are sexually active and are younger than 67 years of age.  You are older than 67 years of age and your health care provider tells you that you are at risk for this type of infection.  Your sexual activity has changed since you were last screened and you are at an increased risk for chlamydia or gonorrhea. Ask your health care provider if you are at risk.  If you do not have HIV, but are at risk, it may be recommended that you take a prescription medicine  daily to prevent HIV infection. This is called pre-exposure prophylaxis (PrEP). You are considered at risk if:  You are sexually active and do not regularly use condoms or know the HIV status of your partner(s).  You take drugs by injection.  You are sexually active with a partner who has HIV. Talk with your health care provider about whether you are at high risk of being infected with HIV. If you choose to begin PrEP, you should first be tested for HIV. You should then be tested every 3 months for as long as you are taking PrEP.  PREGNANCY   If you are premenopausal and you may become pregnant, ask your health care provider about preconception counseling.  If you may become pregnant, take 400 to 800 micrograms (mcg) of folic acid every day.  If you want to prevent pregnancy, talk to your health care provider about birth control (contraception). OSTEOPOROSIS AND MENOPAUSE   Osteoporosis is a disease in which the bones lose minerals and strength with aging. This can result in serious bone fractures. Your risk for osteoporosis can be identified using a bone density scan.  If you are 65 years of age or older, or if you are at risk for osteoporosis and fractures, ask your health care provider if you should be screened.  Ask your health care provider whether you should take a calcium or vitamin D supplement to lower your risk for osteoporosis.  Menopause may have certain physical symptoms and  risks.  Hormone replacement therapy may reduce some of these symptoms and risks. Talk to your health care provider about whether hormone replacement therapy is right for you.  HOME CARE INSTRUCTIONS   Schedule regular health, dental, and eye exams.  Stay current with your immunizations.   Do not use any tobacco products including cigarettes, chewing tobacco, or electronic cigarettes.  If you are pregnant, do not drink alcohol.  If you are breastfeeding, limit how much and how often you drink alcohol.  Limit alcohol intake to no more than 1 drink per day for nonpregnant women. One drink equals 12 ounces of beer, 5 ounces of wine, or 1 ounces of hard liquor.  Do not use street drugs.  Do not share needles.  Ask your health care provider for help if you need support or information about quitting drugs.  Tell your health care provider if you often feel depressed.  Tell your health care provider if you have ever been abused or do not feel safe at home.   This information is not intended to replace advice given to you by your health care provider. Make sure you discuss any questions you have with your health care provider.   Document Released: 08/02/2010 Document Revised: 02/07/2014 Document Reviewed: 12/19/2012 Elsevier Interactive Patient Education [employer].

## 2015-06-10 NOTE — Addendum Note (Signed)
Addended by: Terence Lux B on: 06/10/2015 10:36 AM   Modules accepted: Orders

## 2015-06-10 NOTE — Assessment & Plan Note (Signed)
Not ideally controlled - discussed GERD and reflux are the same and her GERD is not ideally controlled Continue omeprazole daily Start zantac at night Stressed importance of getting GERD controlled Due for colonoscopy next year - will likely have EGD at same time, which is probably a good idea

## 2015-06-10 NOTE — Progress Notes (Signed)
Subjective:    Patient ID: Kendra Leonard, female    DOB: January 07, 1951, 65 y.o.   MRN: [id number]  HPI She is here to establish with a new pcp.  She is here for a physical exam.   She walks 2-3 times a week for one hour.  She occasionally goes to the gym, maybe once a week.  She does not exercise regularly in the winter.  She wants to lose weight.   GERD;  She is taking her medication daily.  She has reflux often in the morning or evening.  She denies "GERD".   Hypertension: She is taking her medication daily. She is compliant with a low sodium diet.  She denies chest pain, palpitations, edema, shortness of breath and regular headaches. She is exercising regularly.  She does not monitor her blood pressure at home.     Medications and allergies reviewed with patient and updated if appropriate.  Patient Active Problem List   Diagnosis Date Noted  . Irritable bowel syndrome (IBS) 11/03/2014  . Hematuria, microscopic 04/27/2011  . DIVERTICULITIS, HX OF 05/28/2009  . DEGENERATIVE JOINT DISEASE, KNEE 04/22/2008  . POPLITEAL CYST, RIGHT 04/22/2008  . COLONIC POLYPS, HX OF 01/28/2008  . Essential hypertension 01/26/2007  . EROSIVE ESOPHAGITIS 12/22/2006  . DIVERTICULOSIS, COLON 12/22/2006  . GERD 10/24/2006  . COMMON MIGRAINE 06/27/2006    Current Outpatient Prescriptions on File Prior to Visit  Medication Sig Dispense Refill  . benazepril (LOTENSIN) 40 MG tablet TAKE 1 TABLET BY MOUTH DAILY. 90 tablet 3  . Calcium Carbonate-Vitamin D (CALCIUM + D PO) Take by mouth daily.    . Cholecalciferol (VITAMIN D-3) 1000 UNITS CAPS Take by mouth daily.    . Omega-3 Fatty Acids (FISH OIL) 1200 MG CAPS Take by mouth daily.    Marland Kitchen omeprazole (PRILOSEC) 20 MG capsule TAKE 1 CAPSULE 30 MINUTES BEFORE BREAKFAST. 90 capsule 3   No current facility-administered medications on file prior to visit.    Past Medical History  Diagnosis Date  . GERD (gastroesophageal reflux disease) 2008    Dr  Carlean Purl  . Hypertension   . Colon polyp 2008    3 mm  . DJD (degenerative joint disease) of knee     Dr Maureen Ralphs    Past Surgical History  Procedure Laterality Date  . Laparoscopy      For Infertility work up  . Polypectomy  2008    60mm polyp ;Dr.Gessner  . G 2  p 2    . Cystoscopy      X4 for microscopic hematuria  . Upper gi endoscopy  2008    Dr Carlean Purl  . Intraarticular steroid injection  2013 & 2014    Dr Maureen Ralphs    Social History   Social History  . Marital Status: Married    Spouse Name: N/A  . Number of Children: N/A  . Years of Education: N/A   Social History Main Topics  . Smoking status: Never Smoker   . Smokeless tobacco: Not on file  . Alcohol Use: Yes     Comment: Socially only on occasion  . Drug Use: No  . Sexual Activity: Not on file   Other Topics Concern  . Not on file   Social History Narrative    Family History  Problem Relation Age of Onset  . Prostate cancer Father 51  . Hypertension Father   . Aneurysm Father     Thoracic Aneurysm  . Dysrhythmia Father   .  Uterine cancer Mother 34  . Hypertension Mother   . Hypertension Brother   . Diabetes Maternal Aunt   . Diabetes Maternal Uncle   . Tremor Maternal Aunt     2 aunts ( no FH Parkinson's)  . Stroke Maternal Grandfather     >55  . GER disease Father     PMH DUD  . GER disease Brother   . Other Mother     colovaginal fistula    Review of Systems  Constitutional: Negative for fever, chills, appetite change and fatigue.  HENT: Positive for trouble swallowing (rare). Negative for hearing loss.   Eyes: Negative for visual disturbance.  Respiratory: Negative for cough, shortness of breath and wheezing.   Cardiovascular: Negative for palpitations.  Gastrointestinal: Positive for constipation. Negative for nausea, abdominal pain, diarrhea and blood in stool.       Reflux often in morning, sometimes in the evening  Genitourinary: Negative for dysuria and hematuria.    Musculoskeletal: Positive for arthralgias (some arthritis). Negative for myalgias and back pain.  Skin: Negative for color change and rash.  Neurological: Negative for dizziness, light-headedness and headaches.  Psychiatric/Behavioral: Negative for dysphoric mood. The patient is not nervous/anxious.         Objective:   Filed Vitals:   06/10/15 0915  BP: 116/84  Pulse: 75  Temp: 97.4 F (36.3 C)  Resp: 16   Filed Weights   06/10/15 0915  Weight: 144 lb (65.318 kg)   Body mass index is 24.71 kg/(m^2).   Physical Exam Constitutional: She appears well-developed and well-nourished. No distress.  HENT:  Head: Normocephalic and atraumatic.  Right Ear: External ear normal. Normal ear canal and TM Left Ear: External ear normal.  Normal ear canal and TM Mouth/Throat: Oropharynx is clear and moist.  Eyes: Conjunctivae and EOM are normal.  Neck: Neck supple. No tracheal deviation present. No thyromegaly present.  No carotid bruit  Cardiovascular: Normal rate, regular rhythm and normal heart sounds.   No murmur heard.  No edema. Pulmonary/Chest: Effort normal and breath sounds normal. No respiratory distress. She has no wheezes. She has no rales.  Breast: deferred to Gyn Abdominal: Soft. She exhibits no distension. There is no tenderness.  Lymphadenopathy: She has no cervical adenopathy.  Skin: Skin is warm and dry. She is not diaphoretic.  Psychiatric: She has a normal mood and affect. Her behavior is normal.         Assessment & Plan:    Physical exam: Screening blood work ordered Immunizations  Up to date  Colonoscopy  Up to date  Mammogram  Up to date  Gyn   Up to date  Dexa  Up to date  Eye exams Up to date EKG  done in 2015 Exercise  - walking regularly in summer only Weight - normal BMI Skin  - no concerns Substance abuse -- no evidence of abuse   See Problem List for Assessment and Plan of chronic medical problems.  Follow up annually

## 2015-06-11 LAB — HEPATITIS C ANTIBODY: HCV Ab: NEGATIVE

## 2015-06-13 ENCOUNTER — Encounter: Payer: Self-pay | Admitting: Internal Medicine

## 2015-07-03 ENCOUNTER — Encounter: Payer: Self-pay | Admitting: Family

## 2015-07-03 ENCOUNTER — Ambulatory Visit (INDEPENDENT_AMBULATORY_CARE_PROVIDER_SITE_OTHER): Payer: BC Managed Care – PPO | Admitting: Family

## 2015-07-03 ENCOUNTER — Telehealth: Payer: Self-pay | Admitting: Family

## 2015-07-03 ENCOUNTER — Other Ambulatory Visit (INDEPENDENT_AMBULATORY_CARE_PROVIDER_SITE_OTHER): Payer: BC Managed Care – PPO

## 2015-07-03 ENCOUNTER — Ambulatory Visit (INDEPENDENT_AMBULATORY_CARE_PROVIDER_SITE_OTHER)
Admission: RE | Admit: 2015-07-03 | Discharge: 2015-07-03 | Disposition: A | Discharge status: Home / Self Care | Payer: BC Managed Care – PPO | Source: Ambulatory Visit | Attending: Family | Admitting: Family

## 2015-07-03 VITALS — BP 100/80 | HR 96 | Temp 98.8°F | Ht 64.0 in | Wt 139.5 lb

## 2015-07-03 DIAGNOSIS — R05 Cough: Secondary | ICD-10-CM

## 2015-07-03 DIAGNOSIS — R509 Fever, unspecified: Secondary | ICD-10-CM

## 2015-07-03 DIAGNOSIS — J181 Lobar pneumonia, unspecified organism: Principal | ICD-10-CM

## 2015-07-03 DIAGNOSIS — R059 Cough, unspecified: Secondary | ICD-10-CM

## 2015-07-03 DIAGNOSIS — J189 Pneumonia, unspecified organism: Secondary | ICD-10-CM

## 2015-07-03 LAB — COMPREHENSIVE METABOLIC PANEL
ALBUMIN: 3.9 g/dL (ref 3.5–5.2)
ALK PHOS: 78 U/L (ref 39–117)
ALT: 15 U/L (ref 0–35)
AST: 16 U/L (ref 0–37)
BUN: 8 mg/dL (ref 6–23)
CALCIUM: 9.2 mg/dL (ref 8.4–10.5)
CHLORIDE: 100 meq/L (ref 96–112)
CO2: 26 mEq/L (ref 19–32)
CREATININE: 0.81 mg/dL (ref 0.40–1.20)
GFR: 74.97 mL/min (ref >60.00)
Glucose, Bld: 122 mg/dL — ABNORMAL HIGH (ref 70–99)
POTASSIUM: 3.7 meq/L (ref 3.5–5.1)
SODIUM: 137 meq/L (ref 135–145)
TOTAL PROTEIN: 7.4 g/dL (ref 6.0–8.3)
Total Bilirubin: 0.4 mg/dL (ref 0.2–1.2)

## 2015-07-03 LAB — CBC WITH DIFFERENTIAL/PLATELET
Basophils Absolute: 0 10*3/uL (ref 0.0–0.1)
Basophils Relative: 0.2 % (ref 0.0–3.0)
EOS ABS: 0 10*3/uL (ref 0.0–0.7)
EOS PCT: 0 % (ref 0.0–5.0)
HCT: 35.8 % — ABNORMAL LOW (ref 36.0–46.0)
HEMOGLOBIN: 11.9 g/dL — AB (ref 12.0–15.0)
Lymphocytes Relative: 8.1 % — ABNORMAL LOW (ref 12.0–46.0)
Lymphs Abs: 1 10*3/uL (ref 0.7–4.0)
MCHC: 33.3 g/dL (ref 30.0–36.0)
MCV: 85.5 fl (ref 78.0–100.0)
MONO ABS: 0.9 10*3/uL (ref 0.1–1.0)
Monocytes Relative: 7.1 % (ref 3.0–12.0)
Neutro Abs: 10.5 10*3/uL — ABNORMAL HIGH (ref 1.4–7.7)
Neutrophils Relative %: 84.6 % — ABNORMAL HIGH (ref 43.0–77.0)
Platelets: 203 10*3/uL (ref 150.0–400.0)
RBC: 4.19 Mil/uL (ref 3.87–5.11)
RDW: 13 % (ref 11.5–15.5)
WBC: 12.4 10*3/uL — AB (ref 4.0–10.5)

## 2015-07-03 MED ORDER — AZITHROMYCIN 250 MG PO TABS: ORAL_TABLET | ORAL | Status: DC

## 2015-07-03 NOTE — Progress Notes (Signed)
Pre visit review using our clinic review tool, if applicable. No additional management support is needed unless otherwise documented below in the visit note. 

## 2015-07-03 NOTE — Patient Instructions (Signed)
Suspect viral cause of fever but etiology unclear at this time.  Lab work.   Chest x ray.   Please be vigilant with fever and continues to run over weekend, please go to ED.   If there is no improvement in your symptoms, or if there is any worsening of symptoms, or if you have any additional concerns, please return for re-evaluation; or, if we are closed, consider going to the Emergency Room for evaluation if symptoms urgent.

## 2015-07-03 NOTE — Telephone Encounter (Signed)
Spoke with patient with [facility]. Sent in prescription for patient. Given return precautions.

## 2015-07-03 NOTE — Progress Notes (Addendum)
Subjective:    Patient ID: Kendra Leonard, female    DOB: 07/14/1950, 64 y.o.   MRN: [id number]   Kendra Leonard is a 65 y.o. female who presents today for an acute visit.    HPI Comments: Patient here for evaluation of multiple complaints which started 5 days ago.[place] and started feeling , unwell 5 days ago once returned from [place].   Felt better yesterday. Has been taking aleve with relief. She endorses fatigue, and a fever with tmax 103 yesterday. Fever today was 102 early morning. She also endorses symptoms of nausea, chills, and an episode of vomiting. Saw a tick on her pillow case but no tick bite or known exposure to poison ivy.  Has seasonal allergies.   Bug bites LE 6 days ago ,were itchy and have since improved.    Past Medical History  Diagnosis Date  . GERD (gastroesophageal reflux disease) 2008    Dr Carlean Purl  . Hypertension   . Colon polyp 2008    3 mm  . DJD (degenerative joint disease) of knee     Dr Maureen Ralphs   Allergies: Review of patient's allergies indicates no known allergies. Current Outpatient Prescriptions on File Prior to Visit  Medication Sig Dispense Refill  . benazepril (LOTENSIN) 40 MG tablet TAKE 1 TABLET BY MOUTH DAILY. 90 tablet 3  . Calcium Carbonate-Vitamin D (CALCIUM + D PO) Take by mouth daily.    . Cholecalciferol (VITAMIN D-3) 1000 UNITS CAPS Take by mouth daily.    . Omega-3 Fatty Acids (FISH OIL) 1200 MG CAPS Take by mouth daily.    Marland Kitchen omeprazole (PRILOSEC) 20 MG capsule TAKE 1 CAPSULE 30 MINUTES BEFORE BREAKFAST. 90 capsule 3   No current facility-administered medications on file prior to visit.    Social History  Substance Use Topics  . Smoking status: Never Smoker   . Smokeless tobacco: None  . Alcohol Use: Yes     Comment: Socially only on occasion    Review of Systems  Constitutional: Positive for fever and chills.  HENT: Negative for ear pain, sinus pressure and sore throat.    Respiratory: Positive for cough ('some nonproductive cough').   Cardiovascular: Negative for chest pain and palpitations.  Gastrointestinal: Positive for nausea and vomiting. Negative for abdominal pain and diarrhea.  Genitourinary: Negative for dysuria.  Musculoskeletal: Positive for myalgias (improved). Negative for neck pain.  Skin: Negative for rash.  Neurological: Positive for headaches ('slight HA from sinus pressure').      Objective:    BP 100/80 mmHg  Pulse 96  Temp(Src) 98.8 F (37.1 C) (Oral)  Ht 5\' 4"  (1.626 m)  Wt 139 lb 8 oz (63.277 kg)  BMI 23.93 kg/m2  SpO2 98%   Physical Exam  Constitutional: She appears well-developed and well-nourished.  HENT:  Head: Normocephalic and atraumatic.  Right Ear: Hearing, tympanic membrane, external ear and ear canal normal. No drainage, swelling or tenderness. No foreign bodies. Tympanic membrane is not erythematous and not bulging. No middle ear effusion. No decreased hearing is noted.  Left Ear: Hearing, tympanic membrane, external ear and ear canal normal. No drainage, swelling or tenderness. No foreign bodies. Tympanic membrane is not erythematous and not bulging.  No middle ear effusion. No decreased hearing is noted.  Nose: Nose normal. No rhinorrhea. Right sinus exhibits no maxillary sinus tenderness and no frontal sinus tenderness. Left sinus exhibits no maxillary sinus tenderness and no frontal sinus tenderness.  Mouth/Throat: Uvula is midline, oropharynx is clear and moist and mucous membranes are normal. No oropharyngeal exudate, posterior oropharyngeal edema, posterior oropharyngeal erythema or tonsillar abscesses.  Eyes: Conjunctivae are normal.  Cardiovascular: Regular rhythm, normal heart sounds and normal pulses.   No lower extremity swelling or diffuse erythema. No increase in warmth.  Pulmonary/Chest: Effort normal and breath sounds normal. She has no wheezes. She has no rhonchi. She has no rales.  Abdominal: Soft.  Normal appearance and bowel sounds are normal. She exhibits no distension, no fluid wave, no ascites and no mass. There is no tenderness. There is no rigidity, no rebound, no guarding, no tenderness at McBurney's point and negative Murphy's sign.  Lymphadenopathy:       Head (right side): No submental, no submandibular, no tonsillar, no preauricular, no posterior auricular and no occipital adenopathy present.       Head (left side): No submental, no submandibular, no tonsillar, no preauricular, no posterior auricular and no occipital adenopathy present.    She has no cervical adenopathy.  Neurological: She is alert.  Skin: Skin is warm and dry.  Several scattered maculopapular lesions noted bilateral lower extremities. Less than 1 cm in diameter, and many have scabbed over. No streaking, discharge.  Psychiatric: She has a normal mood and affect. Her speech is normal and behavior is normal. Thought content normal.  Vitals reviewed.      Assessment & Plan:   1. Fever, unspecified fever cause Afebrile at this time. However she had a fever earlier today, 102. Source nonspecific at this time.Suspect viral due to duration of symptoms and body aches which have resolved. Low suspicion that insect bites are source.   Pending CBC, CMP, chest x-ray to further investigate source of fever. As I discussed with patient, if these things are unrevealing, we may maintain patient with close observation however if fever persists greater than 101 over the weekend or patient develops new symptoms, I have advised her to go to emergency room. Patient understands this plan.   - CBC with Differential/Platelet; Future - Comprehensive metabolic panel; Future  2. Cough Suspect viral versus allergic etiology. Lung sounds clear. - DG Chest 2 View    I am having Ms. Conley Canal maintain her Fish Oil, Calcium Carbonate-Vitamin D (CALCIUM + D PO), Vitamin D-3, omeprazole, and benazepril.   No orders of the defined types  were placed in this encounter.     Start medications as prescribed and explained to patient on After Visit Summary ( AVS). Risks, benefits, and alternatives of the medications and treatment plan prescribed today were discussed, and patient expressed understanding.   Education regarding symptom management and diagnosis given to patient.   Follow-up:Plan follow-up and return precautions given if any worsening symptoms or change in condition.   Continue to follow with Binnie Rail, MD for routine health maintenance.   Danise Edge and I agreed with plan.   Mable Paris, FNP

## 2015-07-21 LAB — HM MAMMOGRAPHY

## 2015-07-23 ENCOUNTER — Encounter: Payer: Self-pay | Admitting: Internal Medicine

## 2015-08-13 ENCOUNTER — Other Ambulatory Visit: Payer: Self-pay | Admitting: Internal Medicine

## 2015-08-17 ENCOUNTER — Telehealth: Payer: Self-pay | Admitting: Internal Medicine

## 2015-08-17 NOTE — Telephone Encounter (Signed)
Pt request to speak to the assistant concern about bill/code on the acct. Please call her back

## 2015-08-18 NOTE — Telephone Encounter (Signed)
email to dawn herrington to make sure that the 06/10/2015 visit notes support a sick/well

## 2015-08-19 NOTE — Telephone Encounter (Signed)
Patient called again about this bill. I informed her once we had some information we would get back in touch with her.

## 2015-08-20 NOTE — Telephone Encounter (Signed)
Sending email to have copay adjusted off as a courtesy. Called left vm to advise.

## 2015-08-21 NOTE — Telephone Encounter (Signed)
Spoke to patient. Advised of rules around cpe/sick visit. She understood. Advised that we took the copay off as a courtesy due to theway that this occurred at her visit. Moving forward, she understands that we have to bill an OV if we go outside of the scope of a CPE

## 2015-11-12 ENCOUNTER — Ambulatory Visit (INDEPENDENT_AMBULATORY_CARE_PROVIDER_SITE_OTHER): Payer: BC Managed Care – PPO

## 2015-11-12 DIAGNOSIS — Z23 Encounter for immunization: Secondary | ICD-10-CM | POA: Diagnosis not present

## 2016-02-16 ENCOUNTER — Ambulatory Visit (INDEPENDENT_AMBULATORY_CARE_PROVIDER_SITE_OTHER): Payer: BC Managed Care – PPO | Admitting: Family

## 2016-02-16 ENCOUNTER — Encounter: Payer: Self-pay | Admitting: Family

## 2016-02-16 DIAGNOSIS — J069 Acute upper respiratory infection, unspecified: Secondary | ICD-10-CM | POA: Insufficient documentation

## 2016-02-16 NOTE — Assessment & Plan Note (Signed)
Symptoms and exam are consistent with acute upper respiratory infection most likely viral. Recommend continued over the counter medications as needed for symptom relief and supportive care. Follow up if symptoms worsen or do not improve.

## 2016-02-16 NOTE — Progress Notes (Signed)
Subjective:    Patient ID: Kendra Leonard, female    DOB: 11/17/1950, 66 y.o.   MRN: [id number]  Chief Complaint  Patient presents with  . Cough    x1 week, cough and congestion    HPI:  EZEKIEL Leonard is a 66 y.o. female who  has a past medical history of Colon polyp (2008); DJD (degenerative joint disease) of knee; GERD (gastroesophageal reflux disease) (2008); and Hypertension. and presents today for an acute office visit.  This is a new problem. Associated symptoms of congestion and cough have been going on for about 1 week. Denies fevers. Has had some dizziness. Cough is generally non-productive. Modifying factors include Nyquil and Coricidin which has helped with her symptoms. Overall improved since initial onset. Dizziness is described as a light headed. Indicates that she does drink a significant amount water.   No Known Allergies    Outpatient Medications Prior to Visit  Medication Sig Dispense Refill  . benazepril (LOTENSIN) 40 MG tablet TAKE 1 TABLET BY MOUTH DAILY. 90 tablet 3  . Calcium Carbonate-Vitamin D (CALCIUM + D PO) Take by mouth daily.    . Cholecalciferol (VITAMIN D-3) 1000 UNITS CAPS Take by mouth daily.    . Omega-3 Fatty Acids (FISH OIL) 1200 MG CAPS Take by mouth daily.    Marland Kitchen omeprazole (PRILOSEC) 20 MG capsule TAKE 1 CAPSULE 30 MINUTES  BEFORE BREAKFAST 90 capsule 3  . azithromycin (ZITHROMAX) 250 MG tablet Take 2 tablets ( total 500 mg) PO on day 1, then take 1 tablet ( total 250 mg) by mouth q24h x 4 days. 6 tablet 0   No facility-administered medications prior to visit.     Review of Systems  Constitutional: Negative for fever.  HENT: Positive for congestion. Negative for sinus pain, sinus pressure and sore throat.   Respiratory: Positive for cough. Negative for chest tightness, shortness of breath and wheezing.   Neurological: Positive for dizziness and headaches.      Objective:    BP 140/88 (BP Location: Left Arm, Patient Position: Sitting,  Cuff Size: Normal)   Pulse 83   Temp 98.1 F (36.7 C) (Oral)   Resp 16   Ht 5\' 4"  (1.626 m)   Wt 141 lb (64 kg)   SpO2 97%   BMI 24.20 kg/m  Nursing note and vital signs reviewed.  Physical Exam  Constitutional: She is oriented to person, place, and time. She appears well-developed and well-nourished. No distress.  Cardiovascular: Normal rate, regular rhythm, normal heart sounds and intact distal pulses.   Pulmonary/Chest: Effort normal and breath sounds normal.  Neurological: She is alert and oriented to person, place, and time.  Skin: Skin is warm and dry.  Psychiatric: She has a normal mood and affect. Her behavior is normal. Judgment and thought content normal.       Assessment & Plan:   Problem List Items Addressed This Visit      Respiratory   Acute upper respiratory infection    Symptoms and exam are consistent with acute upper respiratory infection most likely viral. Recommend continued over the counter medications as needed for symptom relief and supportive care. Follow up if symptoms worsen or do not improve.          I have discontinued Ms. Peel's azithromycin. I am also having her maintain her Fish Oil, Calcium Carbonate-Vitamin D (CALCIUM + D PO), Vitamin D-3, benazepril, and omeprazole.   Follow-up: Return if symptoms worsen or fail to improve.  Mauricio Po, FNP

## 2016-02-16 NOTE — Patient Instructions (Addendum)
Thank you for choosing [facility].  SUMMARY AND INSTRUCTIONS:  Recommend Mucinex-DM and Flonase to help with your symptoms.   If you worsen, please let us know.   Medication:  Your prescription(s) have been submitted to your pharmacy or been printed and provided for you. Please take as directed and contact our office if you believe you are having problem(s) with the medication(s) or have any questions.  Follow up:  If your symptoms worsen or fail to improve, please contact our office for further instruction, or in case of emergency go directly to the emergency room at the closest medical facility.    General Recommendations:    Please drink plenty of fluids.  Get plenty of rest   Sleep in humidified air  Use saline nasal sprays  Netti pot   OTC Medications:  Decongestants - helps relieve congestion   Flonase (generic fluticasone) or Nasacort (generic triamcinolone) - please make sure to use the "cross-over" technique at a 45 degree angle towards the opposite eye as opposed to straight up the nasal passageway.   Sudafed (generic pseudoephedrine - Note this is the one that is available behind the pharmacy counter); Products with phenylephrine (-PE) may also be used but is often not as effective as pseudoephedrine.   If you have HIGH BLOOD PRESSURE - Coricidin HBP; AVOID any product that is -D as this contains pseudoephedrine which may increase your blood pressure.  Afrin (oxymetazoline) every 6-8 hours for up to 3 days.   Allergies - helps relieve runny nose, itchy eyes and sneezing   Claritin (generic loratidine), Allegra (fexofenidine), or Zyrtec (generic cyrterizine) for runny nose. These medications should not cause drowsiness.  Note - Benadryl (generic diphenhydramine) may be used however may cause drowsiness  Cough -   Delsym or Robitussin (generic dextromethorphan)  Expectorants - helps loosen mucus to ease removal   Mucinex (generic guaifenesin)  as directed on the package.  Headaches / General Aches   Tylenol (generic acetaminophen) - DO NOT EXCEED 3 grams (3,000 mg) in a 24 hour time period  Advil/Motrin (generic ibuprofen)   Sore Throat -   Salt water gargle   Chloraseptic (generic benzocaine) spray or lozenges / Sucrets (generic dyclonine)     Upper Respiratory Infection, Adult Most upper respiratory infections (URIs) are caused by a virus. A URI affects the nose, throat, and upper air passages. The most common type of URI is often called "the common cold." Follow these instructions at home:  Take medicines only as told by your doctor.  Gargle warm saltwater or take cough drops to comfort your throat as told by your doctor.  Use a warm mist humidifier or inhale steam from a shower to increase air moisture. This may make it easier to breathe.  Drink enough fluid to keep your pee (urine) clear or pale yellow.  Eat soups and other clear broths.  Have a healthy diet.  Rest as needed.  Go back to work when your fever is gone or your doctor says it is okay.  You may need to stay home longer to avoid giving your URI to others.  You can also wear a face mask and wash your hands often to prevent spread of the virus.  Use your inhaler more if you have asthma.  Do not use any tobacco products, including cigarettes, chewing tobacco, or electronic cigarettes. If you need help quitting, ask your doctor. Contact a doctor if:  You are getting worse, not better.  Your symptoms are not  helped by medicine.  You have chills.  You are getting more short of breath.  You have brown or red mucus.  You have yellow or brown discharge from your nose.  You have pain in your face, especially when you bend forward.  You have a fever.  You have puffy (swollen) neck glands.  You have pain while swallowing.  You have white areas in the back of your throat. Get help right away if:  You have very bad or  constant:  Headache.  Ear pain.  Pain in your forehead, behind your eyes, and over your cheekbones (sinus pain).  Chest pain.  You have long-lasting (chronic) lung disease and any of the following:  Wheezing.  Long-lasting cough.  Coughing up blood.  A change in your usual mucus.  You have a stiff neck.  You have changes in your:  Vision.  Hearing.  Thinking.  Mood. This information is not intended to replace advice given to you by your health care provider. Make sure you discuss any questions you have with your health care provider. Document Released: 07/06/2007 Document Revised: 09/20/2015 Document Reviewed: 04/24/2013 Elsevier Interactive Patient Education  2017 [employer].

## 2016-06-07 ENCOUNTER — Ambulatory Visit (INDEPENDENT_AMBULATORY_CARE_PROVIDER_SITE_OTHER): Payer: BC Managed Care – PPO | Admitting: Internal Medicine

## 2016-06-07 ENCOUNTER — Encounter: Payer: Self-pay | Admitting: Internal Medicine

## 2016-06-07 ENCOUNTER — Other Ambulatory Visit (INDEPENDENT_AMBULATORY_CARE_PROVIDER_SITE_OTHER): Payer: BC Managed Care – PPO

## 2016-06-07 VITALS — BP 132/86 | HR 79 | Temp 97.9°F | Resp 16 | Ht 64.0 in | Wt 143.0 lb

## 2016-06-07 DIAGNOSIS — K2100 Gastro-esophageal reflux disease with esophagitis, without bleeding: Secondary | ICD-10-CM

## 2016-06-07 DIAGNOSIS — M85852 Other specified disorders of bone density and structure, left thigh: Secondary | ICD-10-CM | POA: Diagnosis not present

## 2016-06-07 DIAGNOSIS — M85851 Other specified disorders of bone density and structure, right thigh: Secondary | ICD-10-CM

## 2016-06-07 DIAGNOSIS — I10 Essential (primary) hypertension: Secondary | ICD-10-CM

## 2016-06-07 DIAGNOSIS — R7303 Prediabetes: Secondary | ICD-10-CM | POA: Diagnosis not present

## 2016-06-07 DIAGNOSIS — Z0000 Encounter for general adult medical examination without abnormal findings: Secondary | ICD-10-CM

## 2016-06-07 DIAGNOSIS — M8580 Other specified disorders of bone density and structure, unspecified site: Secondary | ICD-10-CM | POA: Insufficient documentation

## 2016-06-07 LAB — COMPREHENSIVE METABOLIC PANEL
ALBUMIN: 4.4 g/dL (ref 3.5–5.2)
ALK PHOS: 76 U/L (ref 39–117)
ALT: 12 U/L (ref 0–35)
AST: 14 U/L (ref 0–37)
BUN: 10 mg/dL (ref 6–23)
CO2: 29 mEq/L (ref 19–32)
Calcium: 9.7 mg/dL (ref 8.4–10.5)
Chloride: 105 mEq/L (ref 96–112)
Creatinine, Ser: 0.76 mg/dL (ref 0.40–1.20)
GFR: 80.46 mL/min (ref >60.00)
Glucose, Bld: 104 mg/dL — ABNORMAL HIGH (ref 70–99)
POTASSIUM: 4.1 meq/L (ref 3.5–5.1)
Sodium: 139 mEq/L (ref 135–145)
TOTAL PROTEIN: 7.2 g/dL (ref 6.0–8.3)
Total Bilirubin: 0.5 mg/dL (ref 0.2–1.2)

## 2016-06-07 LAB — CBC WITH DIFFERENTIAL/PLATELET
Basophils Absolute: 0.1 10*3/uL (ref 0.0–0.1)
Basophils Relative: 1.2 % (ref 0.0–3.0)
EOS PCT: 1.7 % (ref 0.0–5.0)
Eosinophils Absolute: 0.1 10*3/uL (ref 0.0–0.7)
HCT: 39 % (ref 36.0–46.0)
HEMOGLOBIN: 13.1 g/dL (ref 12.0–15.0)
LYMPHS ABS: 1.8 10*3/uL (ref 0.7–4.0)
Lymphocytes Relative: 31 % (ref 12.0–46.0)
MCHC: 33.5 g/dL (ref 30.0–36.0)
MCV: 86.5 fl (ref 78.0–100.0)
MONOS PCT: 6.8 % (ref 3.0–12.0)
Monocytes Absolute: 0.4 10*3/uL (ref 0.1–1.0)
Neutro Abs: 3.3 10*3/uL (ref 1.4–7.7)
Neutrophils Relative %: 59.3 % (ref 43.0–77.0)
Platelets: 232 10*3/uL (ref 150.0–400.0)
RBC: 4.5 Mil/uL (ref 3.87–5.11)
RDW: 13.1 % (ref 11.5–15.5)
WBC: 5.6 10*3/uL (ref 4.0–10.5)

## 2016-06-07 LAB — LIPID PANEL
CHOLESTEROL: 182 mg/dL (ref 0–200)
HDL: 56 mg/dL (ref >39.00)
LDL CALC: 108 mg/dL — AB (ref 0–99)
NonHDL: 125.56
TRIGLYCERIDES: 90 mg/dL (ref 0.0–149.0)
Total CHOL/HDL Ratio: 3
VLDL: 18 mg/dL (ref 0.0–40.0)

## 2016-06-07 LAB — HEMOGLOBIN A1C: HEMOGLOBIN A1C: 6 % (ref 4.6–6.5)

## 2016-06-07 LAB — TSH: TSH: 1.79 u[IU]/mL (ref 0.35–4.50)

## 2016-06-07 MED ORDER — BENAZEPRIL HCL 40 MG PO TABS: ORAL_TABLET | ORAL | 3 refills | Status: DC

## 2016-06-07 NOTE — Progress Notes (Signed)
Subjective:    Patient ID: Kendra Leonard, female    DOB: 03-Oct-1950, 66 y.o.   MRN: [id number]  HPI She is here for a physical exam.   She has been walking 1-2 times a week for an hour.  She goes to the gym occasionally.    Medications and allergies reviewed with patient and updated if appropriate.  Patient Active Problem List   Diagnosis Date Noted  . Osteopenia 06/07/2016  . Irritable bowel syndrome (IBS) 11/03/2014  . Hematuria, microscopic 04/27/2011  . DIVERTICULITIS, HX OF 05/28/2009  . DEGENERATIVE JOINT DISEASE, KNEE 04/22/2008  . POPLITEAL CYST, RIGHT 04/22/2008  . COLONIC POLYPS, HX OF 01/28/2008  . Essential hypertension 01/26/2007  . EROSIVE ESOPHAGITIS 12/22/2006  . DIVERTICULOSIS, COLON 12/22/2006  . GERD 10/24/2006  . COMMON MIGRAINE 06/27/2006    Current Outpatient Prescriptions on File Prior to Visit  Medication Sig Dispense Refill  . benazepril (LOTENSIN) 40 MG tablet TAKE 1 TABLET BY MOUTH DAILY. 90 tablet 3  . Calcium Carbonate-Vitamin D (CALCIUM + D PO) Take by mouth daily.    . Cholecalciferol (VITAMIN D-3) 1000 UNITS CAPS Take by mouth daily.    . Omega-3 Fatty Acids (FISH OIL) 1200 MG CAPS Take by mouth daily.    Marland Kitchen omeprazole (PRILOSEC) 20 MG capsule TAKE 1 CAPSULE 30 MINUTES  BEFORE BREAKFAST 90 capsule 3   No current facility-administered medications on file prior to visit.     Past Medical History:  Diagnosis Date  . Colon polyp 2008   3 mm  . DJD (degenerative joint disease) of knee    Dr Maureen Ralphs  . GERD (gastroesophageal reflux disease) 2008   Dr Carlean Purl  . Hypertension     Past Surgical History:  Procedure Laterality Date  . CYSTOSCOPY     X4 for microscopic hematuria  . G 2  P 2    . intraarticular steroid injection  2013 & 2014   Dr Maureen Ralphs  . LAPAROSCOPY     For Infertility work up  . POLYPECTOMY  2008   41mm polyp ;Dr.Gessner  . UPPER GI ENDOSCOPY  2008   Dr Carlean Purl    Social History   Social History  . Marital  status: Married    Spouse name: N/A  . Number of children: N/A  . Years of education: N/A   Social History Main Topics  . Smoking status: Never Smoker  . Smokeless tobacco: Never Used  . Alcohol use Yes     Comment: Socially only on occasion  . Drug use: No  . Sexual activity: Not Asked   Other Topics Concern  . None   Social History Narrative   Walking regularly for exercise    Family History  Problem Relation Age of Onset  . Prostate cancer Father 63  . Hypertension Father   . Aneurysm Father     Thoracic Aneurysm  . Dysrhythmia Father   . GER disease Father     PMH DUD  . Dementia Father   . Parkinsonism Father   . Uterine cancer Mother 53  . Hypertension Mother   . Other Mother     colovaginal fistula  . Hypertension Brother   . Diabetes Maternal Aunt   . Diabetes Maternal Uncle   . Tremor Maternal Aunt     2 aunts ( no FH Parkinson's)  . Stroke Maternal Grandfather     >55  . GER disease Brother     Review of Systems  Constitutional: Negative  for appetite change, chills, fatigue and fever.  Eyes: Negative for visual disturbance.  Respiratory: Negative for cough, shortness of breath and wheezing.   Cardiovascular: Negative for chest pain, palpitations and leg swelling.  Gastrointestinal: Negative for abdominal pain, blood in stool, constipation, diarrhea and nausea.  Genitourinary: Negative for dysuria and hematuria.  Musculoskeletal: Positive for arthralgias (occasional knee pain). Negative for back pain.  Skin: Negative for color change and rash.  Neurological: Negative for dizziness, light-headedness and headaches.  Psychiatric/Behavioral: Negative for dysphoric mood. The patient is not nervous/anxious.        Objective:   Vitals:   06/07/16 0803  BP: 132/86  Pulse: 79  Resp: 16  Temp: 97.9 F (36.6 C)   Filed Weights   06/07/16 0803  Weight: 143 lb (64.9 kg)   Body mass index is 24.55 kg/m.  Wt Readings from Last 3 Encounters:    06/07/16 143 lb (64.9 kg)  02/16/16 141 lb (64 kg)  07/03/15 139 lb 8 oz (63.3 kg)     Physical Exam Constitutional: She appears well-developed and well-nourished. No distress.  HENT:  Head: Normocephalic and atraumatic.  Right Ear: External ear normal. Normal ear canal and TM Left Ear: External ear normal.  Normal ear canal and TM Mouth/Throat: Oropharynx is clear and moist.  Eyes: Conjunctivae and EOM are normal.  Neck: Neck supple. No tracheal deviation present. No thyromegaly present.  No carotid bruit  Cardiovascular: Normal rate, regular rhythm and normal heart sounds.   No murmur heard.  No edema. Pulmonary/Chest: Effort normal and breath sounds normal. No respiratory distress. She has no wheezes. She has no rales.  Breast: deferred to Gyn Abdominal: Soft. She exhibits no distension. There is no tenderness.  Lymphadenopathy: She has no cervical adenopathy.  Skin: Skin is warm and dry. She is not diaphoretic.  Psychiatric: She has a normal mood and affect. Her behavior is normal.         Assessment & Plan:   Physical exam: Screening blood work  ordered Immunizations discussed shingrix, immunizations up to date Colonoscopy  Up to date  Mammogram  Due next month - will schedule Gyn  Up to date  Dexa done 2016 Eye exams  Up to date  EKG - done 2015 Exercise - walking some and occasionally goes to gym - discussed more regular exercise Weight normal BMI Skin   no concerns -- uses sunscreen Substance abuse  none  See Problem List for Assessment and Plan of chronic medical problems.  FU in one year

## 2016-06-07 NOTE — Assessment & Plan Note (Addendum)
GERD controlled with daily medication Will discuss with GI

## 2016-06-07 NOTE — Patient Instructions (Addendum)
Test(s) ordered today. Your results will be released to [facility] (or called to you) after review, usually within 72hours after test completion. If any changes need to be made, you will be notified at that same time.  All other Health Maintenance issues reviewed.   All recommended immunizations and age-appropriate screenings are up-to-date or discussed.  No immunizations administered today.   Medications reviewed and updated.  No changes recommended at this time.  Your prescription(s) have been submitted to your pharmacy. Please take as directed and contact our office if you believe you are having problem(s) with the medication(s).  Please followup in one year for a physical   Health Maintenance, Female Adopting a healthy lifestyle and getting preventive care can go a long way to promote health and wellness. Talk with your health care provider about what schedule of regular examinations is right for you. This is a good chance for you to check in with your provider about disease prevention and staying healthy. In between checkups, there are plenty of things you can do on your own. Experts have done a lot of research about which lifestyle changes and preventive measures are most likely to keep you healthy. Ask your health care provider for more information. Weight and diet Eat a healthy diet  Be sure to include plenty of vegetables, fruits, low-fat dairy products, and lean protein.  Do not eat a lot of foods high in solid fats, added sugars, or salt.  Get regular exercise. This is one of the most important things you can do for your health.  Most adults should exercise for at least 150 minutes each week. The exercise should increase your heart rate and make you sweat (moderate-intensity exercise).  Most adults should also do strengthening exercises at least twice a week. This is in addition to the moderate-intensity exercise. Maintain a healthy weight  Body mass index (BMI) is a  measurement that can be used to identify possible weight problems. It estimates body fat based on height and weight. Your health care provider can help determine your BMI and help you achieve or maintain a healthy weight.  For females 61 years of age and older:  A BMI below 18.5 is considered underweight.  A BMI of 18.5 to 24.9 is normal.  A BMI of 25 to 29.9 is considered overweight.  A BMI of 30 and above is considered obese. Watch levels of cholesterol and blood lipids  You should start having your blood tested for lipids and cholesterol at 68 years of age, then have this test every 5 years.  You may need to have your cholesterol levels checked more often if:  Your lipid or cholesterol levels are high.  You are older than 68 years of age.  You are at high risk for heart disease. Cancer screening Lung Cancer  Lung cancer screening is recommended for adults 30-61 years old who are at high risk for lung cancer because of a history of smoking.  A yearly low-dose CT scan of the lungs is recommended for people who:  Currently smoke.  Have quit within the past 15 years.  Have at least a 30-pack-year history of smoking. A pack year is smoking an average of one pack of cigarettes a day for 1 year.  Yearly screening should continue until it has been 15 years since you quit.  Yearly screening should stop if you develop a health problem that would prevent you from having lung cancer treatment. Breast Cancer  Practice breast self-awareness. This means  understanding how your breasts normally appear and feel.  It also means doing regular breast self-exams. Let your health care provider know about any changes, no matter how small.  If you are in your 20s or 30s, you should have a clinical breast exam (CBE) by a health care provider every 1-3 years as part of a regular health exam.  If you are 52 or older, have a CBE every year. Also consider having a breast X-ray (mammogram) every  year.  If you have a family history of breast cancer, talk to your health care provider about genetic screening.  If you are at high risk for breast cancer, talk to your health care provider about having an MRI and a mammogram every year.  Breast cancer gene (BRCA) assessment is recommended for women who have family members with BRCA-related cancers. BRCA-related cancers include:  Breast.  Ovarian.  Tubal.  Peritoneal cancers.  Results of the assessment will determine the need for genetic counseling and BRCA1 and BRCA2 testing. Cervical Cancer  Your health care provider may recommend that you be screened regularly for cancer of the pelvic organs (ovaries, uterus, and vagina). This screening involves a pelvic examination, including checking for microscopic changes to the surface of your cervix (Pap test). You may be encouraged to have this screening done every 3 years, beginning at age 48.  For women ages 8-65, health care providers may recommend pelvic exams and Pap testing every 3 years, or they may recommend the Pap and pelvic exam, combined with testing for human papilloma virus (HPV), every 5 years. Some types of HPV increase your risk of cervical cancer. Testing for HPV may also be done on women of any age with unclear Pap test results.  Other health care providers may not recommend any screening for nonpregnant women who are considered low risk for pelvic cancer and who do not have symptoms. Ask your health care provider if a screening pelvic exam is right for you.  If you have had past treatment for cervical cancer or a condition that could lead to cancer, you need Pap tests and screening for cancer for at least 20 years after your treatment. If Pap tests have been discontinued, your risk factors (such as having a new sexual partner) need to be reassessed to determine if screening should resume. Some women have medical problems that increase the chance of getting cervical cancer. In  these cases, your health care provider may recommend more frequent screening and Pap tests. Colorectal Cancer  This type of cancer can be detected and often prevented.  Routine colorectal cancer screening usually begins at 68 years of age and continues through 68 years of age.  Your health care provider may recommend screening at an earlier age if you have risk factors for colon cancer.  Your health care provider may also recommend using home test kits to check for hidden blood in the stool.  A small camera at the end of a tube can be used to examine your colon directly (sigmoidoscopy or colonoscopy). This is done to check for the earliest forms of colorectal cancer.  Routine screening usually begins at age 26.  Direct examination of the colon should be repeated every 5-10 years through 68 years of age. However, you may need to be screened more often if early forms of precancerous polyps or small growths are found. Skin Cancer  Check your skin from head to toe regularly.  Tell your health care provider about any new moles or changes  in moles, especially if there is a change in a mole's shape or color.  Also tell your health care provider if you have a mole that is larger than the size of a pencil eraser.  Always use sunscreen. Apply sunscreen liberally and repeatedly throughout the day.  Protect yourself by wearing long sleeves, pants, a wide-brimmed hat, and sunglasses whenever you are outside. Heart disease, diabetes, and high blood pressure  High blood pressure causes heart disease and increases the risk of stroke. High blood pressure is more likely to develop in:  People who have blood pressure in the high end of the normal range (130-139/85-89 mm Hg).  People who are overweight or obese.  People who are African American.  If you are 58-88 years of age, have your blood pressure checked every 3-5 years. If you are 74 years of age or older, have your blood pressure checked  every year. You should have your blood pressure measured twice-once when you are at a hospital or clinic, and once when you are not at a hospital or clinic. Record the average of the two measurements. To check your blood pressure when you are not at a hospital or clinic, you can use:  An automated blood pressure machine at a pharmacy.  A home blood pressure monitor.  If you are between 86 years and 36 years old, ask your health care provider if you should take aspirin to prevent strokes.  Have regular diabetes screenings. This involves taking a blood sample to check your fasting blood sugar level.  If you are at a normal weight and have a low risk for diabetes, have this test once every three years after 68 years of age.  If you are overweight and have a high risk for diabetes, consider being tested at a younger age or more often. Preventing infection Hepatitis B  If you have a higher risk for hepatitis B, you should be screened for this virus. You are considered at high risk for hepatitis B if:  You were born in a country where hepatitis B is common. Ask your health care provider which countries are considered high risk.  Your parents were born in a high-risk country, and you have not been immunized against hepatitis B (hepatitis B vaccine).  You have HIV or AIDS.  You use needles to inject street drugs.  You live with someone who has hepatitis B.  You have had sex with someone who has hepatitis B.  You get hemodialysis treatment.  You take certain medicines for conditions, including cancer, organ transplantation, and autoimmune conditions. Hepatitis C  Blood testing is recommended for:  Everyone born from [birth year] through 1967.  Anyone with known risk factors for hepatitis C. Sexually transmitted infections (STIs)  You should be screened for sexually transmitted infections (STIs) including gonorrhea and chlamydia if:  You are sexually active and are younger than 68 years of  age.  You are older than 68 years of age and your health care provider tells you that you are at risk for this type of infection.  Your sexual activity has changed since you were last screened and you are at an increased risk for chlamydia or gonorrhea. Ask your health care provider if you are at risk.  If you do not have HIV, but are at risk, it may be recommended that you take a prescription medicine daily to prevent HIV infection. This is called pre-exposure prophylaxis (PrEP). You are considered at risk if:  You are sexually active  and do not regularly use condoms or know the HIV status of your partner(s).  You take drugs by injection.  You are sexually active with a partner who has HIV. Talk with your health care provider about whether you are at high risk of being infected with HIV. If you choose to begin PrEP, you should first be tested for HIV. You should then be tested every 3 months for as long as you are taking PrEP. Pregnancy  If you are premenopausal and you may become pregnant, ask your health care provider about preconception counseling.  If you may become pregnant, take 400 to 800 micrograms (mcg) of folic acid every day.  If you want to prevent pregnancy, talk to your health care provider about birth control (contraception). Osteoporosis and menopause  Osteoporosis is a disease in which the bones lose minerals and strength with aging. This can result in serious bone fractures. Your risk for osteoporosis can be identified using a bone density scan.  If you are 40 years of age or older, or if you are at risk for osteoporosis and fractures, ask your health care provider if you should be screened.  Ask your health care provider whether you should take a calcium or vitamin D supplement to lower your risk for osteoporosis.  Menopause may have certain physical symptoms and risks.  Hormone replacement therapy may reduce some of these symptoms and risks. Talk to your health  care provider about whether hormone replacement therapy is right for you. Follow these instructions at home:  Schedule regular health, dental, and eye exams.  Stay current with your immunizations.  Do not use any tobacco products including cigarettes, chewing tobacco, or electronic cigarettes.  If you are pregnant, do not drink alcohol.  If you are breastfeeding, limit how much and how often you drink alcohol.  Limit alcohol intake to no more than 1 drink per day for nonpregnant women. One drink equals 12 ounces of beer, 5 ounces of wine, or 1 ounces of hard liquor.  Do not use street drugs.  Do not share needles.  Ask your health care provider for help if you need support or information about quitting drugs.  Tell your health care provider if you often feel depressed.  Tell your health care provider if you have ever been abused or do not feel safe at home. This information is not intended to replace advice given to you by your health care provider. Make sure you discuss any questions you have with your health care provider. Document Released: 08/02/2010 Document Revised: 06/25/2015 Document Reviewed: 10/21/2014 Elsevier Interactive Patient Education  2017 [employer].

## 2016-06-07 NOTE — Progress Notes (Signed)
Pre visit review using our clinic review tool, if applicable. No additional management support is needed unless otherwise documented below in the visit note. 

## 2016-06-07 NOTE — Assessment & Plan Note (Signed)
BP well controlled Current regimen effective and well tolerated Continue current medications at current doses  

## 2016-06-07 NOTE — Assessment & Plan Note (Signed)
Check a1c Low sugar / carb diet Stressed regular exercise, keeping weight down  

## 2016-06-09 ENCOUNTER — Encounter: Payer: Self-pay | Admitting: Internal Medicine

## 2016-06-15 LAB — HM MAMMOGRAPHY

## 2016-06-28 ENCOUNTER — Encounter: Payer: Self-pay | Admitting: Internal Medicine

## 2016-06-28 NOTE — Progress Notes (Unsigned)
Results entered and sent to scan  

## 2016-07-09 ENCOUNTER — Other Ambulatory Visit: Payer: Self-pay | Admitting: Internal Medicine

## 2016-08-22 ENCOUNTER — Other Ambulatory Visit: Payer: Self-pay | Admitting: Internal Medicine

## 2016-09-15 ENCOUNTER — Telehealth: Payer: Self-pay | Admitting: Internal Medicine

## 2016-09-15 NOTE — Telephone Encounter (Signed)
I reviewed her chart. I am not opposed to performing an EGD direct but I need to know more than she wants one as to why we are doing it.  I suggest that she send me a my chart message or maybe you can get a history as to what symptoms or problems she has that makes her think she needs an EGD. I know she had some reflux esophagitis in the past in 2008.  I think that's about the best I can do without seeing her in the office.

## 2016-09-15 NOTE — Telephone Encounter (Signed)
Dr. Carlean Purl see note below regarding wanting EGD along with recall colon. Please advise.

## 2016-09-16 NOTE — Telephone Encounter (Signed)
OK for direct EGD and colonoscopy  Dx for EGD is dysphagia and GERD

## 2016-09-16 NOTE — Telephone Encounter (Signed)
Please schedule pt for EGD and Colon, see note below from Dr. Carlean Purl.

## 2016-09-16 NOTE — Telephone Encounter (Signed)
Pt states that she has reflux and sometimes in the am she can feel "acid in her throat." Also states that sometimes when she eats steak or watermelon she feels like she is going to choke. Please advise regarding scheduling an EGD and Colon.

## 2016-09-21 NOTE — Telephone Encounter (Signed)
Spoke with patient who will call back next Thursday 09/29/16 to sch endo colon with Dr.Gessner. Patient wanting a Friday morning. No BT; non diabetic

## 2016-10-04 ENCOUNTER — Encounter: Payer: Self-pay | Admitting: Internal Medicine

## 2016-11-22 ENCOUNTER — Ambulatory Visit (INDEPENDENT_AMBULATORY_CARE_PROVIDER_SITE_OTHER): Payer: BC Managed Care – PPO | Admitting: General Practice

## 2016-11-22 DIAGNOSIS — Z23 Encounter for immunization: Secondary | ICD-10-CM

## 2016-12-14 ENCOUNTER — Ambulatory Visit (AMBULATORY_SURGERY_CENTER): Payer: Self-pay | Admitting: *Deleted

## 2016-12-14 ENCOUNTER — Other Ambulatory Visit: Payer: Self-pay

## 2016-12-14 VITALS — Ht 63.0 in | Wt 147.0 lb

## 2016-12-14 DIAGNOSIS — Z1211 Encounter for screening for malignant neoplasm of colon: Secondary | ICD-10-CM

## 2016-12-14 DIAGNOSIS — R1310 Dysphagia, unspecified: Secondary | ICD-10-CM

## 2016-12-14 DIAGNOSIS — R1319 Other dysphagia: Secondary | ICD-10-CM

## 2016-12-14 DIAGNOSIS — K219 Gastro-esophageal reflux disease without esophagitis: Secondary | ICD-10-CM

## 2016-12-14 NOTE — Progress Notes (Signed)
Patient denies any allergies to egg or soy products. Patient denies complications with anesthesia/sedation.  Patient denies oxygen use at home and denies diet medications. Patient denied information on colonoscopy. 

## 2016-12-21 ENCOUNTER — Encounter: Payer: Self-pay | Admitting: Internal Medicine

## 2016-12-29 ENCOUNTER — Telehealth: Payer: Self-pay | Admitting: Internal Medicine

## 2016-12-29 NOTE — Telephone Encounter (Signed)
Returned patients call regarding taking cold medicine prior to procedure. Patient was informed that it was ok to continue taking alka-selzer cold and sinus prior to procedure tomorrow. Patient was informed that three hours prior nothing by mouth. Patient verbalizes understanding.   Riki Sheer, LPN

## 2016-12-30 ENCOUNTER — Other Ambulatory Visit: Payer: Self-pay

## 2016-12-30 ENCOUNTER — Ambulatory Visit (AMBULATORY_SURGERY_CENTER): Payer: BC Managed Care – PPO | Admitting: Internal Medicine

## 2016-12-30 ENCOUNTER — Encounter: Payer: Self-pay | Admitting: Internal Medicine

## 2016-12-30 VITALS — BP 123/76 | HR 64 | Temp 99.3°F | Resp 18 | Ht 64.0 in | Wt 141.0 lb

## 2016-12-30 DIAGNOSIS — R1310 Dysphagia, unspecified: Secondary | ICD-10-CM | POA: Diagnosis present

## 2016-12-30 DIAGNOSIS — Z1211 Encounter for screening for malignant neoplasm of colon: Secondary | ICD-10-CM | POA: Diagnosis not present

## 2016-12-30 DIAGNOSIS — Z1212 Encounter for screening for malignant neoplasm of rectum: Secondary | ICD-10-CM

## 2016-12-30 DIAGNOSIS — R1319 Other dysphagia: Secondary | ICD-10-CM

## 2016-12-30 DIAGNOSIS — Z538 Procedure and treatment not carried out for other reasons: Secondary | ICD-10-CM

## 2016-12-30 DIAGNOSIS — K317 Polyp of stomach and duodenum: Secondary | ICD-10-CM

## 2016-12-30 MED ORDER — SODIUM CHLORIDE 0.9 % IV SOLN: 500.0000 mL | INTRAVENOUS | Status: DC

## 2016-12-30 NOTE — Op Note (Signed)
[facility] Patient Name: Kendra Leonard Procedure Date: 12/30/2016 8:50 AM MRN: [id number] Endoscopist: Gatha Mayer , MD Age: 66 Referring MD:  Date of Birth: December 08, 1950 Gender: Female Account #: [id number] Procedure:                Colonoscopy Indications:              Screening for colorectal malignant neoplasm, Last                            colonoscopy: 2008 Medicines:                Propofol per Anesthesia, Monitored Anesthesia Care Procedure:                Pre-Anesthesia Assessment:                           - Prior to the procedure, a History and Physical                            was performed, and patient medications and                            allergies were reviewed. The patient's tolerance of                            previous anesthesia was also reviewed. The risks                            and benefits of the procedure and the sedation                            options and risks were discussed with the patient.                            All questions were answered, and informed consent                            was obtained. Prior Anticoagulants: The patient has                            taken no previous anticoagulant or antiplatelet                            agents. ASA Grade Assessment: II - A patient with                            mild systemic disease. After reviewing the risks                            and benefits, the patient was deemed in                            satisfactory condition to undergo the procedure.  After obtaining informed consent, the colonoscope                            was passed under direct vision. Throughout the                            procedure, the patient's blood pressure, pulse, and                            oxygen saturations were monitored continuously. The                            Model PCF-[id number]) scope was introduced                            through the  anus with the intention of advancing to                            the cecum. The scope was advanced to the sigmoid                            colon before the procedure was aborted. Medications                            were given. The colonoscopy was technically                            difficult and complex due to restricted mobility of                            the colon, significant looping and a tortuous                            colon. The quality of the bowel preparation was                            excellent. The rectum was photographed. Scope In: 9:03:23 AM Scope Out: 9:25:14 AM Scope Withdrawal Time: 0 hours 0 minutes 1 second  Total Procedure Duration: 0 hours 21 minutes 51 seconds  Findings:                 The perianal and digital rectal examinations were                            normal.                           Many small and large-mouthed diverticula were found                            in the sigmoid colon. The distal sigmoid colon was                            fixed and looping below made it impossible to  safely pass despite changing to pediatric                            colonoscope, applying abdominal pressure and                            changing positions.                           No additional abnormalities were found on                            retroflexion. Complications:            No immediate complications. Estimated Blood Loss:     Estimated blood loss: none. Impression:               - Diverticulosis in the sigmoid colon. Incomplete                            exam - only to simoid see above.                           - No specimens collected. Recommendation:           - Patient has a contact number available for                            emergencies. The signs and symptoms of potential                            delayed complications were discussed with the                            patient. Return to normal  activities tomorrow.                            Written discharge instructions were provided to the                            patient.                           - Clear liquids x 1 hour then soft foods rest of                            day. Start prior diet tomorrow.                           - Continue present medications.                           - No repeat colonoscopy due to fixed sigmoid colon                            that did not allow passage of scope. Consider CT  colonoscopy. Will discuss. Gatha Mayer, MD 12/30/2016 9:40:34 AM This report has been signed electronically.

## 2016-12-30 NOTE — Op Note (Signed)
[facility] Patient Name: Jesse Hirst Procedure Date: 12/30/2016 8:50 AM MRN: [id number] Endoscopist: Gatha Mayer , MD Age: 66 Referring MD:  Date of Birth: 1950/12/18 Gender: Female Account #: [id number] Procedure:                Upper GI endoscopy Indications:              Dysphagia Medicines:                Propofol per Anesthesia, Monitored Anesthesia Care Procedure:                Pre-Anesthesia Assessment:                           - Prior to the procedure, a History and Physical                            was performed, and patient medications and                            allergies were reviewed. The patient's tolerance of                            previous anesthesia was also reviewed. The risks                            and benefits of the procedure and the sedation                            options and risks were discussed with the patient.                            All questions were answered, and informed consent                            was obtained. Prior Anticoagulants: The patient has                            taken no previous anticoagulant or antiplatelet                            agents. ASA Grade Assessment: II - A patient with                            mild systemic disease. After reviewing the risks                            and benefits, the patient was deemed in                            satisfactory condition to undergo the procedure.                           After obtaining informed consent, the endoscope was  passed under direct vision. Throughout the                            procedure, the patient's blood pressure, pulse, and                            oxygen saturations were monitored continuously. The                            Endoscope was introduced through the mouth, and                            advanced to the second part of duodenum. The upper                            GI endoscopy was  accomplished without difficulty.                            The patient tolerated the procedure well. Scope In: Scope Out: Findings:                 Multiple diminutive semi-sessile polyps with no                            stigmata of recent bleeding were found in the                            gastric body. Biopsies were taken with a cold                            forceps for histology. Verification of patient                            identification for the specimen was done. Estimated                            blood loss was minimal.                           A 1 cm hiatal hernia was present.                           The exam was otherwise without abnormality.                           The cardia and gastric fundus were otherwise normal                            on retroflexion.                           The scope was withdrawn. Dilation was performed in                            the entire esophagus with a Venia Minks  dilator with                            mild resistance at 54 Fr. Complications:            No immediate complications. Estimated Blood Loss:     Estimated blood loss: none. Impression:               - Multiple gastric polyps. Biopsied.                           - 1 cm hiatal hernia.                           - The examination was otherwise normal.                           - Dilation performed in the entire esophagus. Recommendation:           - Patient has a contact number available for                            emergencies. The signs and symptoms of potential                            delayed complications were discussed with the                            patient. Return to normal activities tomorrow.                            Written discharge instructions were provided to the                            patient.                           - Clear liquids x 1 hour then soft foods rest of                            day. Start prior diet tomorrow.                            - Continue present medications.                           - Await pathology results. Gatha Mayer, MD 12/30/2016 9:34:56 AM This report has been signed electronically.

## 2016-12-30 NOTE — Progress Notes (Signed)
Pt's states no medical or surgical changes since previsit or office visit. 

## 2016-12-30 NOTE — Patient Instructions (Addendum)
There were some innocent-looking stomach polyps that I took biopsies of. Do not think they are a problem.  I dilated the esophagus to help you swallow better. The esophagus looks good - if you are still having heartburn would need to consider changing your medication to a different acid-blocker or increasing the dose of omeprazole.  I was unable to complete the colonoscopy - I could not navigate the lower colon due to diverticulosis changes. I think you should consider having a CT colonoscopy.  I appreciate the opportunity to care for you. Gatha Mayer, MD, FACG  YOU HAD AN ENDOSCOPIC PROCEDURE TODAY AT [facility]:   Refer to the procedure report that was given to you for any specific questions about what was found during the examination.  If the procedure report does not answer your questions, please call your gastroenterologist to clarify.  If you requested that your care partner not be given the details of your procedure findings, then the procedure report has been included in a sealed envelope for you to review at your convenience later.  YOU SHOULD EXPECT: Some feelings of bloating in the abdomen. Passage of more gas than usual.  Walking can help get rid of the air that was put into your GI tract during the procedure and reduce the bloating. If you had a lower endoscopy (such as a colonoscopy or flexible sigmoidoscopy) you may notice spotting of blood in your stool or on the toilet paper. If you underwent a bowel prep for your procedure, you may not have a normal bowel movement for a few days.  Please Note:  You might notice some irritation and congestion in your nose or some drainage.  This is from the oxygen used during your procedure.  There is no need for concern and it should clear up in a day or so.  SYMPTOMS TO REPORT IMMEDIATELY:   Following lower endoscopy (colonoscopy or flexible sigmoidoscopy):  Excessive amounts of blood in the stool  Significant  tenderness or worsening of abdominal pains  Swelling of the abdomen that is new, acute  Fever of 100F or higher   Following upper endoscopy (EGD)  Vomiting of blood or coffee ground material  New chest pain or pain under the shoulder blades  Painful or persistently difficult swallowing  New shortness of breath  Fever of 100F or higher  Black, tarry-looking stools  For urgent or emergent issues, a gastroenterologist can be reached at any hour by calling [phone].   DIET:  We do recommend a small meal at first, but then you may proceed to your regular diet.  Drink plenty of fluids but you should avoid alcoholic beverages for 24 hours.  ACTIVITY:  You should plan to take it easy for the rest of today and you should NOT DRIVE or use heavy machinery until tomorrow (because of the sedation medicines used during the test).    FOLLOW UP: Our staff will call the number listed on your records the next business day following your procedure to check on you and address any questions or concerns that you may have regarding the information given to you following your procedure. If we do not reach you, we will leave a message.  However, if you are feeling well and you are not experiencing any problems, there is no need to return our call.  We will assume that you have returned to your regular daily activities without incident.  If any biopsies were taken you will be  contacted by phone or by letter within the next 1-3 weeks.  Please call us at [phone] if you have not heard about the biopsies in 3 weeks.    SIGNATURES/CONFIDENTIALITY: You and/or your care partner have signed paperwork which will be entered into your electronic medical record.  These signatures attest to the fact that that the information above on your After Visit Summary has been reviewed and is understood.  Full responsibility of the confidentiality of this discharge information lies with you and/or your  care-partner.   After dilation diet given with instructions. Diverticulosis information given.

## 2016-12-30 NOTE — Progress Notes (Signed)
To recovery, report to RN, VSS. 

## 2016-12-30 NOTE — Progress Notes (Signed)
Pt. Discharged to home.  She has sligtly distended abdomen.  Has been in the bathroom for an extended period to attempt to pass air.  States she just feels like she needs to have bowel movement.  Dr. Carlean Purl been in to check on her 2-3 times while in recovery.  Encouraged to increase her moving around when she gets home.  Will call us back if any problems.

## 2016-12-30 NOTE — Progress Notes (Signed)
Called to room to assist during endoscopic procedure.  Patient ID and intended procedure confirmed with present staff. Received instructions for my participation in the procedure from the performing physician.  

## 2017-01-02 ENCOUNTER — Telehealth: Payer: Self-pay

## 2017-01-02 NOTE — Telephone Encounter (Signed)
  Follow up Call-  Call back number 12/30/2016  Post procedure Call Back phone  #  [phone]  Permission to leave phone message Yes  Some recent data might be hidden     Patient questions:  Do you have a fever, pain , or abdominal swelling? No. Pain Score  0 *  Have you tolerated food without any problems? Yes.    Have you been able to return to your normal activities? Yes.    Do you have any questions about your discharge instructions: Diet   No. Medications  No. Follow up visit  No.  Do you have questions or concerns about your Care? No.  Actions: * If pain score is 4 or above: No action needed, pain <4.

## 2017-01-03 ENCOUNTER — Telehealth: Payer: Self-pay

## 2017-01-03 DIAGNOSIS — Z1211 Encounter for screening for malignant neoplasm of colon: Secondary | ICD-10-CM

## 2017-01-03 NOTE — Progress Notes (Signed)
Stomach polyps are benign and do not need f/u Hope swallowing is better Prior staff message re: I recommend she do a screening CT colonoscopy since I could not complete colonoscopy  No recall or letter from [facility]

## 2017-01-03 NOTE — Telephone Encounter (Signed)
Left message for patient to call back  

## 2017-01-03 NOTE — Telephone Encounter (Signed)
-----   Message from Gatha Mayer, MD sent at 01/03/2017 12:48 PM EST ----- Regarding: CT colonoscopy I could not complete her colonoscopy on 11/30.  I discuussed CT colonoscopy some with her.  She has [employer] as a primary so should be covered as a screening - I do recommend she do one and please schedule if she is willing.  She may need some more explanation of what that entails.  Let me know if she has ? That I need to answer.  Thx  CEG

## 2017-01-12 ENCOUNTER — Telehealth: Payer: Self-pay | Admitting: Internal Medicine

## 2017-01-12 ENCOUNTER — Other Ambulatory Visit: Payer: BC Managed Care – PPO

## 2017-01-12 ENCOUNTER — Ambulatory Visit: Payer: BC Managed Care – PPO | Admitting: Nurse Practitioner

## 2017-01-12 ENCOUNTER — Encounter: Payer: Self-pay | Admitting: Nurse Practitioner

## 2017-01-12 ENCOUNTER — Ambulatory Visit (INDEPENDENT_AMBULATORY_CARE_PROVIDER_SITE_OTHER)
Admission: RE | Admit: 2017-01-12 | Discharge: 2017-01-12 | Disposition: A | Discharge status: Home / Self Care | Payer: BC Managed Care – PPO | Source: Ambulatory Visit | Attending: Nurse Practitioner | Admitting: Nurse Practitioner

## 2017-01-12 VITALS — BP 124/74 | HR 91 | Temp 97.9°F | Resp 16 | Ht 64.0 in | Wt 147.0 lb

## 2017-01-12 DIAGNOSIS — R319 Hematuria, unspecified: Secondary | ICD-10-CM | POA: Diagnosis not present

## 2017-01-12 DIAGNOSIS — M545 Low back pain, unspecified: Secondary | ICD-10-CM

## 2017-01-12 LAB — POCT URINALYSIS DIPSTICK
Bilirubin, UA: NEGATIVE
GLUCOSE UA: NEGATIVE
Ketones, UA: NEGATIVE
NITRITE UA: NEGATIVE
PROTEIN UA: NEGATIVE
Spec Grav, UA: 1.02 (ref 1.010–1.025)
Urobilinogen, UA: NEGATIVE E.U./dL — AB
pH, UA: 6 (ref 5.0–8.0)

## 2017-01-12 MED ORDER — NITROFURANTOIN MONOHYD MACRO 100 MG PO CAPS: 100.0000 mg | ORAL_CAPSULE | Freq: Two times a day (BID) | ORAL | 0 refills | Status: DC

## 2017-01-12 MED ORDER — METHOCARBAMOL 500 MG PO TABS: 500.0000 mg | ORAL_TABLET | Freq: Four times a day (QID) | ORAL | 0 refills | Status: DC

## 2017-01-12 MED ORDER — CEPHALEXIN 500 MG PO CAPS: 500.0000 mg | ORAL_CAPSULE | Freq: Two times a day (BID) | ORAL | 0 refills | Status: DC

## 2017-01-12 NOTE — Patient Instructions (Addendum)
Please start keflex 500mg  twice daily for 5 days for possible urinary tract infection. Please follow up for fevers, nausea and vomiting, worsening symptoms, or if you are not feeling better by Monday. I will let you know about your urine culture results.  Please head downstairs for an x-ray of your lower back. I will let you know the results.  It was good to meet you. Thanks for letting me take care of you today :)

## 2017-01-12 NOTE — Progress Notes (Signed)
Subjective:    Patient ID: Kendra Leonard, female    DOB: [birth year], 66 y.o.   MRN: [id number]  HPI Kendra Leonard is a 66 yo female who presents today for an acute visit. She has a chief complaint of lower back pain.  This is an acute problem. The problem began over one month ago The pain is a "tight stiff pain" in her left lower back month ago. The pain does not radiate. Thd pain has made it difficult for her to take care of herself, last week she had a hard time bending forward to clip her toenails due to pain. Yesterday the pain was so bad that she could hardly stand up at work. She has been rubbing the area with over the counter pain creme and wearing her husbands back brace with some relief. She became more alarmed this morning when she noticed blood in her urine and has a "funny feeling" in her bladder today. She had a colonoscopy about 2 weeks ago, which the gastroenterologist told her could not be completed and she has been scheduled for upcoming virtual colonoscopy for follow up. She has a past history of bladder infections and microscopic hematuria. She denies weakness, numbness, tingling, urinary or fecal incontinence, dysuria, urinary frequency, vaginal bleeding, rectal bleeding.  Review of Systems  See HPI  Past Medical History:  Diagnosis Date  . Colon polyp 2008   3 mm  . DJD (degenerative joint disease) of knee    Dr Maureen Ralphs  . GERD (gastroesophageal reflux disease) 2008   Dr Carlean Purl  . Hypertension      Social History   Socioeconomic History  . Marital status: Married    Spouse name: Not on file  . Number of children: Not on file  . Years of education: Not on file  . Highest education level: Not on file  Social Needs  . Financial resource strain: Not on file  . Food insecurity - worry: Not on file  . Food insecurity - inability: Not on file  . Transportation needs - medical: Not on file  . Transportation needs - non-medical: Not on file  Occupational  History  . Not on file  Tobacco Use  . Smoking status: Never Smoker  . Smokeless tobacco: Never Used  Substance and Sexual Activity  . Alcohol use: Yes    Comment: Socially   . Drug use: No  . Sexual activity: Not on file  Other Topics Concern  . Not on file  Social History Narrative   Walking regularly for exercise    Past Surgical History:  Procedure Laterality Date  . CYSTOSCOPY     X4 for microscopic hematuria  . G 2  P 2    . intraarticular steroid injection Right 2013 & 2014   Dr Maureen Ralphs  . LAPAROSCOPY     For Infertility work up  . UPPER GI ENDOSCOPY  2008   Dr Carlean Purl  . WISDOM TOOTH EXTRACTION      Family History  Problem Relation Age of Onset  . Prostate cancer Father 44  . Hypertension Father   . Aneurysm Father        Thoracic Aneurysm  . Dysrhythmia Father   . GER disease Father        PMH DUD  . Dementia Father   . Parkinsonism Father   . Uterine cancer Mother 92  . Hypertension Mother   . Other Mother        colovaginal fistula  . Hypertension  Brother   . Diabetes Maternal Aunt   . Diabetes Maternal Uncle   . Tremor Maternal Aunt        2 aunts ( no FH Parkinson's)  . Stroke Maternal Grandfather        >55  . GER disease Brother   . Colon cancer Neg Hx   . Colon polyps Neg Hx   . Rectal cancer Neg Hx   . Stomach cancer Neg Hx     No Known Allergies  Current Outpatient Medications on File Prior to Visit  Medication Sig Dispense Refill  . benazepril (LOTENSIN) 40 MG tablet Take 40 mg daily by mouth.    . Calcium Carbonate-Vitamin D (CALCIUM + D PO) Take by mouth daily.    . Cholecalciferol (VITAMIN D-3) 1000 UNITS CAPS Take by mouth daily.    . Omega-3 Fatty Acids (FISH OIL) 1200 MG CAPS Take by mouth daily.    Marland Kitchen omeprazole (PRILOSEC) 20 MG capsule TAKE 1 CAPSULE 30 MINUTES  BEFORE BREAKFAST 90 capsule 3  . TURMERIC CURCUMIN PO Take 1,000 mg by mouth daily.     No current facility-administered medications on file prior to visit.      BP 124/74 (BP Location: Left Arm, Patient Position: Sitting, Cuff Size: Normal)   Pulse 91   Temp 97.9 F (36.6 C) (Oral)   Resp 16   Ht 5\' 4"  (1.626 m)   Wt 147 lb (66.7 kg)   SpO2 97%   BMI 25.23 kg/m      Objective:   Physical Exam  Constitutional: She is oriented to person, place, and time. She appears well-developed and well-nourished. No distress.  HENT:  Head: Normocephalic and atraumatic.  Cardiovascular: Normal rate, regular rhythm, normal heart sounds and intact distal pulses.  Pulmonary/Chest: Effort normal and breath sounds normal.  Abdominal: Soft. Bowel sounds are normal. She exhibits no distension. There is no hepatosplenomegaly. There is no tenderness. There is no guarding and no CVA tenderness.  Musculoskeletal:       Lumbar back: She exhibits normal range of motion, no tenderness, no bony tenderness, no swelling and no spasm.  Negative straight leg raise bilaterally.  Neurological: She is alert and oriented to person, place, and time. Coordination normal.  Skin: Skin is warm and dry.  Psychiatric: She has a normal mood and affect. Judgment and thought content normal.      Assessment & Plan:   Hematuria, unspecified type Diagnostic testing ordered: - POCT urinalysis dipstick - Urine Culture; Future Medications ordered: - cephALEXin (KEFLEX) 500 MG capsule; Take 1 capsule (500 mg total) by mouth 2 (two) times daily.  Dispense: 10 capsule; Refill: 0 Return precautions given May consider abdominal imaging if urine culture negative, given history of microscopic hematuria and recent difficult colonoscopy.  Acute left-sided low back pain without sciatica Diagnostic testing ordered: - DG Lumbar Spine Complete; Future Medications ordered: - methocarbamol (ROBAXIN) 500 MG tablet; Take 1 tablet (500 mg total) by mouth 4 (four) times daily.  Dispense: 12 tablet; Refill: 0 Per patient request she was given short course of muscle relaxer. Side effects  discussed.

## 2017-01-12 NOTE — Telephone Encounter (Signed)
Copied from [place]. Topic: Quick Communication - See Telephone Encounter >> Jan 12, 2017  8:39 AM Oneta Rack wrote: CRM for notification. See Telephone encounter for:   01/12/17.  Relation to pt: self  Call back number: [phone]   Reason for call:  Patient experiencing back pain and slight blood in urine, patient scheduled acute appointment at [facility] with NP Caesar Chestnut. Attempted triage nurse 3x please advise patient.

## 2017-01-13 LAB — URINE CULTURE
MICRO NUMBER: 81402602
SPECIMEN QUALITY:: ADEQUATE

## 2017-01-16 ENCOUNTER — Other Ambulatory Visit: Payer: Self-pay | Admitting: Nurse Practitioner

## 2017-01-16 DIAGNOSIS — R319 Hematuria, unspecified: Secondary | ICD-10-CM

## 2017-01-16 NOTE — Progress Notes (Signed)
orders

## 2017-01-19 ENCOUNTER — Telehealth: Payer: Self-pay | Admitting: Internal Medicine

## 2017-01-19 ENCOUNTER — Ambulatory Visit: Payer: Self-pay | Admitting: *Deleted

## 2017-01-19 NOTE — Telephone Encounter (Signed)
Copied from [place]. Topic: Quick Communication - See Telephone Encounter >> Jan 19, 2017  4:11 PM Boyd Kerbs wrote: CRM for notification. See Telephone encounter for:  Patient has question about UTI, she is still having pain.   She has several questions 01/19/17.

## 2017-01-19 NOTE — Telephone Encounter (Signed)
Triage  Encounter  Created

## 2017-01-19 NOTE — Telephone Encounter (Signed)
I left a detailed message for the patient that she should contact [facility] at [phone].  They will give instructions on the prep as well.  She is asked to call her for any additional questions or concerns.

## 2017-01-19 NOTE — Telephone Encounter (Signed)
Pt   Called   Stating  She   Was  Recently     Seen   At  [facility]     Took    Course  Of anti  Biotics      And   Was  Referred  To a  Urologist    She    Stated   She   Was  Having  Vague  Symptoms  Which  She  Described  As  A  Urinary  Tract  Infection  She   Stated   She  Took  An otc  Cream    She  Requests  To  Bring a  Urine  specemin  To  The  Office  To  Have  It  Checked . Attempted  To   Ask  protocall   [facility]     And    Offered  To make  An  appoinment  For  Patient    She  Declined  And  Stated  She  Would  followup  With her  Ob gyn

## 2017-01-23 ENCOUNTER — Inpatient Hospital Stay: Admission: RE | Admit: 2017-01-23 | Payer: BC Managed Care – PPO | Source: Ambulatory Visit

## 2017-01-31 HISTORY — PX: PORTA CATH INSERTION: CATH118285

## 2017-02-20 ENCOUNTER — Ambulatory Visit
Admission: RE | Admit: 2017-02-20 | Discharge: 2017-02-20 | Disposition: A | Discharge status: Home / Self Care | Payer: BC Managed Care – PPO | Source: Ambulatory Visit | Attending: Internal Medicine | Admitting: Internal Medicine

## 2017-02-20 DIAGNOSIS — Z1211 Encounter for screening for malignant neoplasm of colon: Secondary | ICD-10-CM

## 2017-02-20 NOTE — Progress Notes (Signed)
Explain to patient - no polyps or cancers seen Prep listed as poor unfortunately  I think this may be the best that we can do in her situation.  Would put in a recall for 5 years and if she wants to discuss further schedule an OV

## 2017-03-30 ENCOUNTER — Encounter: Payer: Self-pay | Admitting: Nurse Practitioner

## 2017-05-23 IMAGING — DX DG CHEST 2V
2 series · 2 of 2 positions shown · non-contrast
Comparison: 05/12/2011

CLINICAL DATA: Cough and fever

EXAM:
CHEST  2 VIEW

[chest pa]
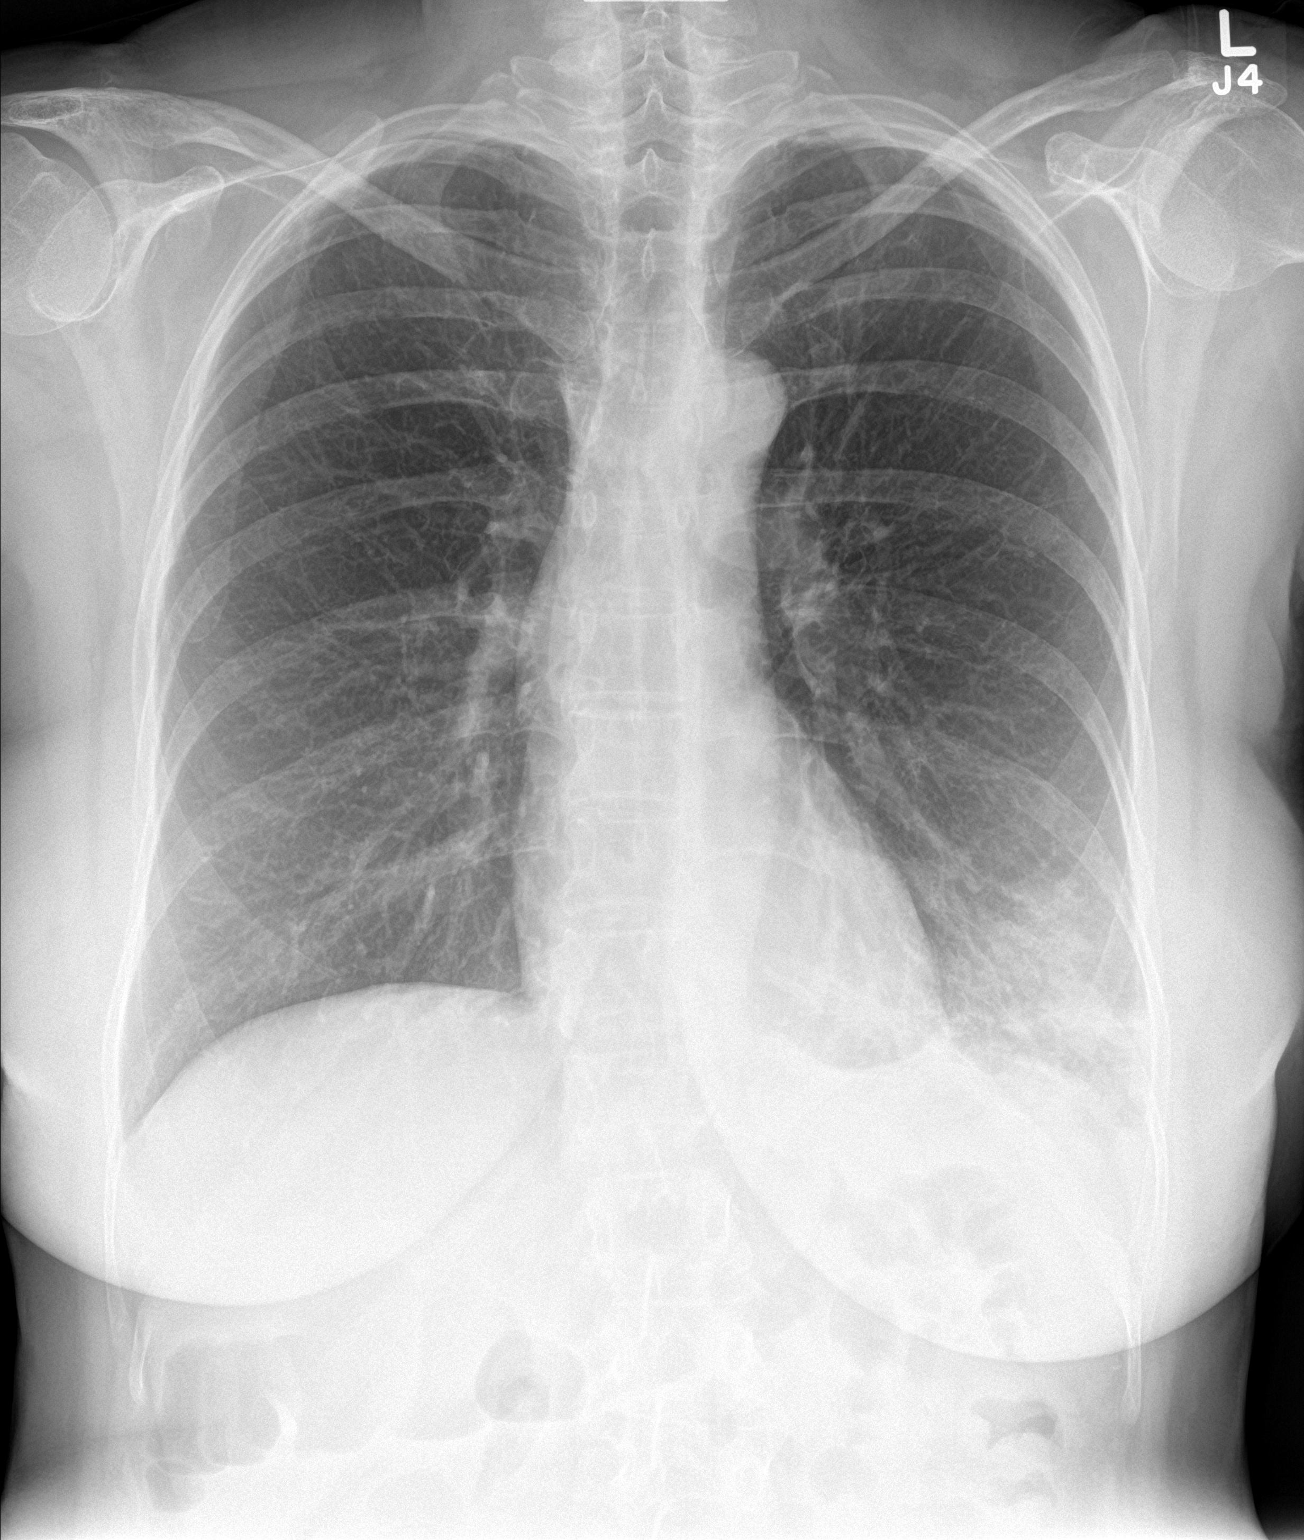

[chest lat]
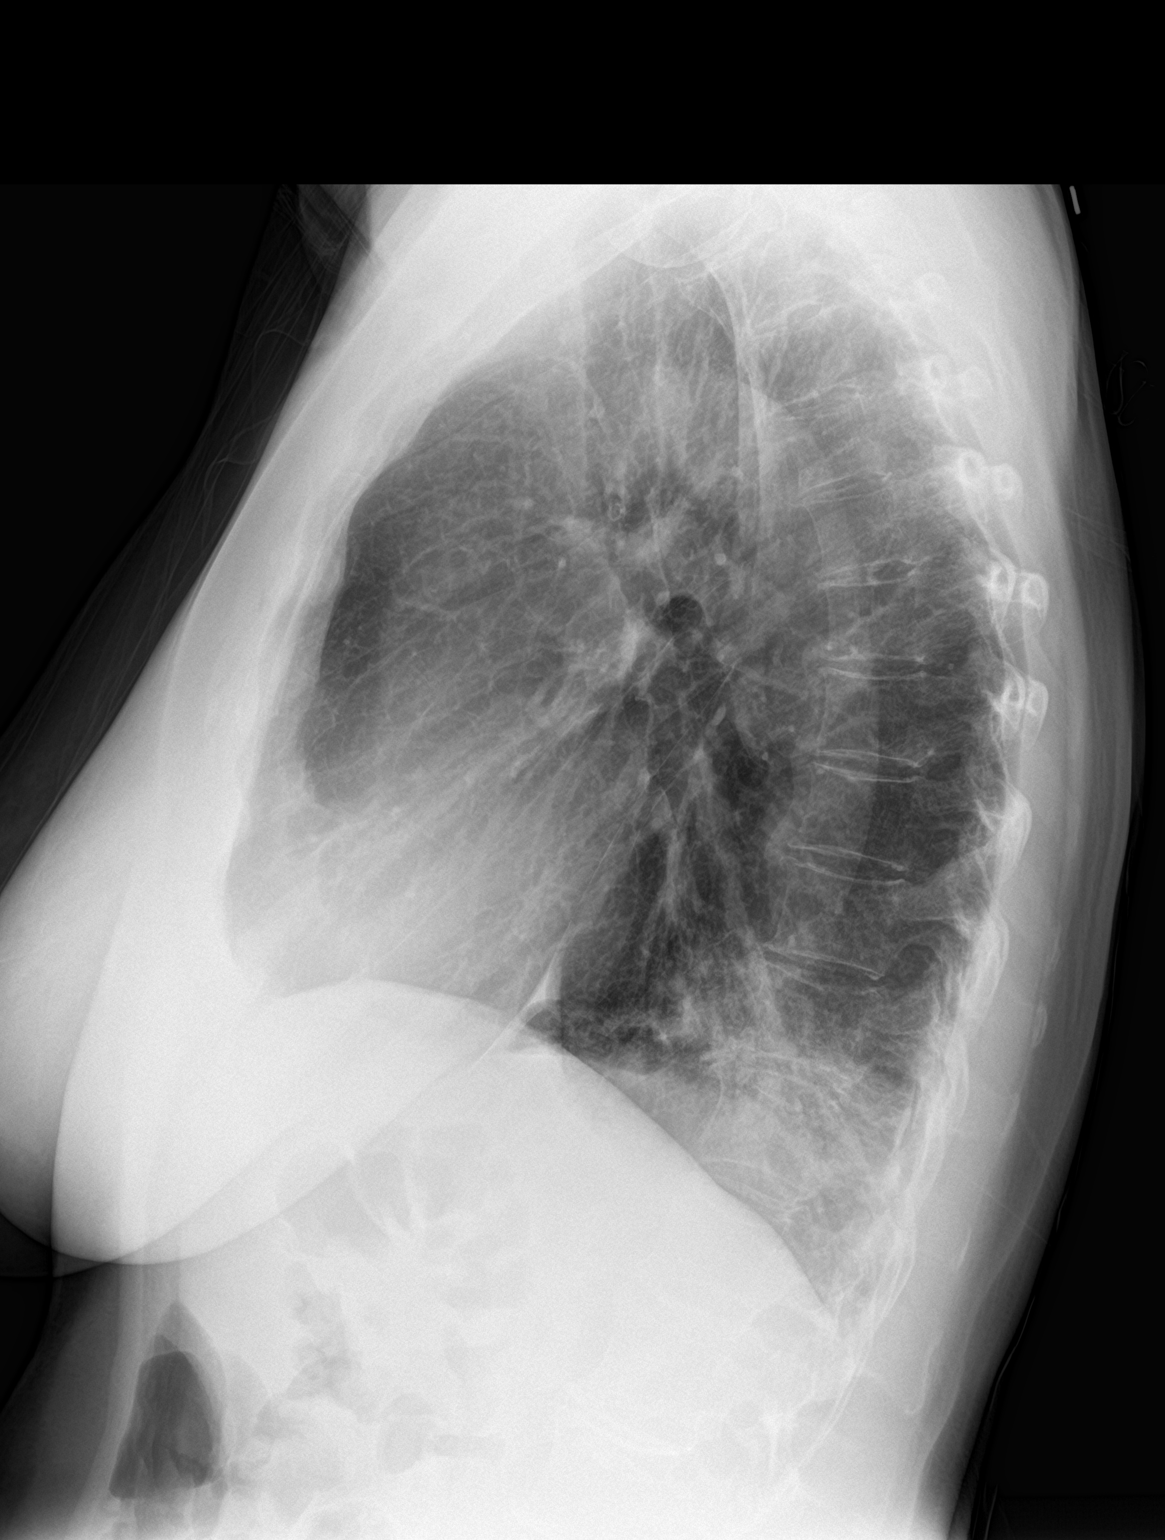

[2 of 2 positions shown; findings below may reference images not displayed]

FINDINGS: Left lower lobe infiltrate compatible with pneumonia. No significant
effusion. Lungs otherwise clear. Probable underlying COPD.

Heart size and vascularity normal. Negative for heart failure.
Negative for mass or adenopathy.
IMPRESSION: Left lower lobe pneumonia

## 2017-06-05 NOTE — Patient Instructions (Addendum)
Test(s) ordered today. Your results will be released to [place] (or called to you) after review, usually within 72hours after test completion. If any changes need to be made, you will be notified at that same time.  All other Health Maintenance issues reviewed.   All recommended immunizations and age-appropriate screenings are up-to-date or discussed.  No immunizations administered today.   Medications reviewed and updated.  No changes recommended at this time.   Please followup in 1 year   Health Maintenance, Female Adopting a healthy lifestyle and getting preventive care can go a long way to promote health and wellness. Talk with your health care provider about what schedule of regular examinations is right for you. This is a good chance for you to check in with your provider about disease prevention and staying healthy. In between checkups, there are plenty of things you can do on your own. Experts have done a lot of research about which lifestyle changes and preventive measures are most likely to keep you healthy. Ask your health care provider for more information. Weight and diet Eat a healthy diet  Be sure to include plenty of vegetables, fruits, low-fat dairy products, and lean protein.  Do not eat a lot of foods high in solid fats, added sugars, or salt.  Get regular exercise. This is one of the most important things you can do for your health. ? Most adults should exercise for at least 150 minutes each week. The exercise should increase your heart rate and make you sweat (moderate-intensity exercise). ? Most adults should also do strengthening exercises at least twice a week. This is in addition to the moderate-intensity exercise.  Maintain a healthy weight  Body mass index (BMI) is a measurement that can be used to identify possible weight problems. It estimates body fat based on height and weight. Your health care provider can help determine your BMI and help you achieve or  maintain a healthy weight.  For females 46 years of age and older: ? A BMI below 18.5 is considered underweight. ? A BMI of 18.5 to 24.9 is normal. ? A BMI of 25 to 29.9 is considered overweight. ? A BMI of 30 and above is considered obese.  Watch levels of cholesterol and blood lipids  You should start having your blood tested for lipids and cholesterol at 69 years of age, then have this test every 5 years.  You may need to have your cholesterol levels checked more often if: ? Your lipid or cholesterol levels are high. ? You are older than 69 years of age. ? You are at high risk for heart disease.  Cancer screening Lung Cancer  Lung cancer screening is recommended for adults 79-75 years old who are at high risk for lung cancer because of a history of smoking.  A yearly low-dose CT scan of the lungs is recommended for people who: ? Currently smoke. ? Have quit within the past 15 years. ? Have at least a 30-pack-year history of smoking. A pack year is smoking an average of one pack of cigarettes a day for 1 year.  Yearly screening should continue until it has been 15 years since you quit.  Yearly screening should stop if you develop a health problem that would prevent you from having lung cancer treatment.  Breast Cancer  Practice breast self-awareness. This means understanding how your breasts normally appear and feel.  It also means doing regular breast self-exams. Let your health care provider know about any changes,  no matter how small.  If you are in your 20s or 30s, you should have a clinical breast exam (CBE) by a health care provider every 1-3 years as part of a regular health exam.  If you are 40 or older, have a CBE every year. Also consider having a breast X-ray (mammogram) every year.  If you have a family history of breast cancer, talk to your health care provider about genetic screening.  If you are at high risk for breast cancer, talk to your health care  provider about having an MRI and a mammogram every year.  Breast cancer gene (BRCA) assessment is recommended for women who have family members with BRCA-related cancers. BRCA-related cancers include: ? Breast. ? Ovarian. ? Tubal. ? Peritoneal cancers.  Results of the assessment will determine the need for genetic counseling and BRCA1 and BRCA2 testing.  Cervical Cancer Your health care provider may recommend that you be screened regularly for cancer of the pelvic organs (ovaries, uterus, and vagina). This screening involves a pelvic examination, including checking for microscopic changes to the surface of your cervix (Pap test). You may be encouraged to have this screening done every 3 years, beginning at age 21.  For women ages 30-65, health care providers may recommend pelvic exams and Pap testing every 3 years, or they may recommend the Pap and pelvic exam, combined with testing for human papilloma virus (HPV), every 5 years. Some types of HPV increase your risk of cervical cancer. Testing for HPV may also be done on women of any age with unclear Pap test results.  Other health care providers may not recommend any screening for nonpregnant women who are considered low risk for pelvic cancer and who do not have symptoms. Ask your health care provider if a screening pelvic exam is right for you.  If you have had past treatment for cervical cancer or a condition that could lead to cancer, you need Pap tests and screening for cancer for at least 20 years after your treatment. If Pap tests have been discontinued, your risk factors (such as having a new sexual partner) need to be reassessed to determine if screening should resume. Some women have medical problems that increase the chance of getting cervical cancer. In these cases, your health care provider may recommend more frequent screening and Pap tests.  Colorectal Cancer  This type of cancer can be detected and often prevented.  Routine  colorectal cancer screening usually begins at 69 years of age and continues through 69 years of age.  Your health care provider may recommend screening at an earlier age if you have risk factors for colon cancer.  Your health care provider may also recommend using home test kits to check for hidden blood in the stool.  A small camera at the end of a tube can be used to examine your colon directly (sigmoidoscopy or colonoscopy). This is done to check for the earliest forms of colorectal cancer.  Routine screening usually begins at age 50.  Direct examination of the colon should be repeated every 5-10 years through 69 years of age. However, you may need to be screened more often if early forms of precancerous polyps or small growths are found.  Skin Cancer  Check your skin from head to toe regularly.  Tell your health care provider about any new moles or changes in moles, especially if there is a change in a mole's shape or color.  Also tell your health care provider if you   have a mole that is larger than the size of a pencil eraser.  Always use sunscreen. Apply sunscreen liberally and repeatedly throughout the day.  Protect yourself by wearing long sleeves, pants, a wide-brimmed hat, and sunglasses whenever you are outside.  Heart disease, diabetes, and high blood pressure  High blood pressure causes heart disease and increases the risk of stroke. High blood pressure is more likely to develop in: ? People who have blood pressure in the high end of the normal range (130-139/85-89 mm Hg). ? People who are overweight or obese. ? People who are African American.  If you are 24-25 years of age, have your blood pressure checked every 3-5 years. If you are 2 years of age or older, have your blood pressure checked every year. You should have your blood pressure measured twice-once when you are at a hospital or clinic, and once when you are not at a hospital or clinic. Record the average of the  two measurements. To check your blood pressure when you are not at a hospital or clinic, you can use: ? An automated blood pressure machine at a pharmacy. ? A home blood pressure monitor.  If you are between 42 years and 59 years old, ask your health care provider if you should take aspirin to prevent strokes.  Have regular diabetes screenings. This involves taking a blood sample to check your fasting blood sugar level. ? If you are at a normal weight and have a low risk for diabetes, have this test once every three years after 69 years of age. ? If you are overweight and have a high risk for diabetes, consider being tested at a younger age or more often. Preventing infection Hepatitis B  If you have a higher risk for hepatitis B, you should be screened for this virus. You are considered at high risk for hepatitis B if: ? You were born in a country where hepatitis B is common. Ask your health care provider which countries are considered high risk. ? Your parents were born in a high-risk country, and you have not been immunized against hepatitis B (hepatitis B vaccine). ? You have HIV or AIDS. ? You use needles to inject street drugs. ? You live with someone who has hepatitis B. ? You have had sex with someone who has hepatitis B. ? You get hemodialysis treatment. ? You take certain medicines for conditions, including cancer, organ transplantation, and autoimmune conditions.  Hepatitis C  Blood testing is recommended for: ? Everyone born from [birth year] through 1967. ? Anyone with known risk factors for hepatitis C.  Sexually transmitted infections (STIs)  You should be screened for sexually transmitted infections (STIs) including gonorrhea and chlamydia if: ? You are sexually active and are younger than 69 years of age. ? You are older than 69 years of age and your health care provider tells you that you are at risk for this type of infection. ? Your sexual activity has changed since you  were last screened and you are at an increased risk for chlamydia or gonorrhea. Ask your health care provider if you are at risk.  If you do not have HIV, but are at risk, it may be recommended that you take a prescription medicine daily to prevent HIV infection. This is called pre-exposure prophylaxis (PrEP). You are considered at risk if: ? You are sexually active and do not regularly use condoms or know the HIV status of your partner(s). ? You take drugs by injection. ?  You are sexually active with a partner who has HIV.  Talk with your health care provider about whether you are at high risk of being infected with HIV. If you choose to begin PrEP, you should first be tested for HIV. You should then be tested every 3 months for as long as you are taking PrEP. Pregnancy  If you are premenopausal and you may become pregnant, ask your health care provider about preconception counseling.  If you may become pregnant, take 400 to 800 micrograms (mcg) of folic acid every day.  If you want to prevent pregnancy, talk to your health care provider about birth control (contraception). Osteoporosis and menopause  Osteoporosis is a disease in which the bones lose minerals and strength with aging. This can result in serious bone fractures. Your risk for osteoporosis can be identified using a bone density scan.  If you are 65 years of age or older, or if you are at risk for osteoporosis and fractures, ask your health care provider if you should be screened.  Ask your health care provider whether you should take a calcium or vitamin D supplement to lower your risk for osteoporosis.  Menopause may have certain physical symptoms and risks.  Hormone replacement therapy may reduce some of these symptoms and risks. Talk to your health care provider about whether hormone replacement therapy is right for you. Follow these instructions at home:  Schedule regular health, dental, and eye exams.  Stay current  with your immunizations.  Do not use any tobacco products including cigarettes, chewing tobacco, or electronic cigarettes.  If you are pregnant, do not drink alcohol.  If you are breastfeeding, limit how much and how often you drink alcohol.  Limit alcohol intake to no more than 1 drink per day for nonpregnant women. One drink equals 12 ounces of beer, 5 ounces of wine, or 1 ounces of hard liquor.  Do not use street drugs.  Do not share needles.  Ask your health care provider for help if you need support or information about quitting drugs.  Tell your health care provider if you often feel depressed.  Tell your health care provider if you have ever been abused or do not feel safe at home. This information is not intended to replace advice given to you by your health care provider. Make sure you discuss any questions you have with your health care provider. Document Released: 08/02/2010 Document Revised: 06/25/2015 Document Reviewed: 10/21/2014 Elsevier Interactive Patient Education  2018 [employer] Inc[employer]  

## 2017-06-05 NOTE — Progress Notes (Signed)
Subjective:    Patient ID: Kendra Leonard, female    DOB: April 19, 1950, 67 y.o.   MRN: [id number]  HPI She is here for a physical exam.   Her lower back is still stiff.  She sits a lot at work.  She did not do PT and knows she should.  She has not been exercising as much and has started walking more recently.  She may try yoga.  She is trying to avoid sitting too much at work.    She has no concerns.    Medications and allergies reviewed with patient and updated if appropriate.  Patient Active Problem List   Diagnosis Date Noted  . Osteopenia 06/07/2016  . Prediabetes 06/07/2016  . Irritable bowel syndrome (IBS) 11/03/2014  . Hematuria, microscopic 04/27/2011  . DIVERTICULITIS, HX OF 05/28/2009  . DEGENERATIVE JOINT DISEASE, KNEE 04/22/2008  . POPLITEAL CYST, RIGHT 04/22/2008  . COLONIC POLYPS, HX OF 01/28/2008  . Essential hypertension 01/26/2007  . EROSIVE ESOPHAGITIS 12/22/2006  . DIVERTICULOSIS, COLON 12/22/2006  . GERD 10/24/2006  . COMMON MIGRAINE 06/27/2006    Current Outpatient Medications on File Prior to Visit  Medication Sig Dispense Refill  . benazepril (LOTENSIN) 40 MG tablet Take 40 mg daily by mouth.    . Calcium Carbonate-Vitamin D (CALCIUM + D PO) Take by mouth daily.    . Cholecalciferol (VITAMIN D-3) 1000 UNITS CAPS Take by mouth daily.    . Omega-3 Fatty Acids (FISH OIL) 1200 MG CAPS Take by mouth daily.    Marland Kitchen omeprazole (PRILOSEC) 20 MG capsule TAKE 1 CAPSULE 30 MINUTES  BEFORE BREAKFAST 90 capsule 3  . TURMERIC CURCUMIN PO Take 1,000 mg by mouth daily.     No current facility-administered medications on file prior to visit.     Past Medical History:  Diagnosis Date  . Colon polyp 2008   3 mm  . DJD (degenerative joint disease) of knee    Dr Maureen Ralphs  . GERD (gastroesophageal reflux disease) 2008   Dr Carlean Purl  . Hypertension     Past Surgical History:  Procedure Laterality Date  . CYSTOSCOPY     X4 for microscopic hematuria  . G 2  P 2      . intraarticular steroid injection Right 2013 & 2014   Dr Maureen Ralphs  . LAPAROSCOPY     For Infertility work up  . UPPER GI ENDOSCOPY  2008   Dr Carlean Purl  . WISDOM TOOTH EXTRACTION      Social History   Socioeconomic History  . Marital status: Married    Spouse name: Not on file  . Number of children: Not on file  . Years of education: Not on file  . Highest education level: Not on file  Occupational History  . Not on file  Social Needs  . Financial resource strain: Not on file  . Food insecurity:    Worry: Not on file    Inability: Not on file  . Transportation needs:    Medical: Not on file    Non-medical: Not on file  Tobacco Use  . Smoking status: Never Smoker  . Smokeless tobacco: Never Used  Substance and Sexual Activity  . Alcohol use: Yes    Comment: Socially   . Drug use: No  . Sexual activity: Not on file  Lifestyle  . Physical activity:    Days per week: Not on file    Minutes per session: Not on file  . Stress: Not on file  Relationships  . Social connections:    Talks on phone: Not on file    Gets together: Not on file    Attends religious service: Not on file    Active member of club or organization: Not on file    Attends meetings of clubs or organizations: Not on file    Relationship status: Not on file  Other Topics Concern  . Not on file  Social History Narrative   Walking regularly for exercise    Family History  Problem Relation Age of Onset  . Prostate cancer Father 54  . Hypertension Father   . Aneurysm Father        Thoracic Aneurysm  . Dysrhythmia Father   . GER disease Father        PMH DUD  . Dementia Father   . Parkinsonism Father   . Uterine cancer Mother 75  . Hypertension Mother   . Other Mother        colovaginal fistula  . Hypertension Brother   . Diabetes Maternal Aunt   . Diabetes Maternal Uncle   . Tremor Maternal Aunt        2 aunts ( no FH Parkinson's)  . Stroke Maternal Grandfather        >55  . GER disease  Brother   . Colon cancer Neg Hx   . Colon polyps Neg Hx   . Rectal cancer Neg Hx   . Stomach cancer Neg Hx     Review of Systems  Constitutional: Negative for chills and fever.  Eyes: Negative for visual disturbance.  Respiratory: Negative for cough, shortness of breath and wheezing.   Cardiovascular: Negative for chest pain, palpitations and leg swelling.  Gastrointestinal: Negative for abdominal pain, blood in stool, constipation, diarrhea and nausea.       Rare gerd  Genitourinary: Negative for dysuria and hematuria.  Musculoskeletal: Positive for back pain (lower back - stiffness).  Skin: Negative for color change and rash.  Neurological: Negative for dizziness, light-headedness and headaches.  Psychiatric/Behavioral: Negative for dysphoric mood. The patient is not nervous/anxious.        Objective:   Vitals:   06/06/17 0750  BP: 130/88  Pulse: 60  Resp: 16  Temp: 98.5 F (36.9 C)  SpO2: 97%   Filed Weights   06/06/17 0750  Weight: 144 lb (65.3 kg)   Body mass index is 24.72 kg/m.  Wt Readings from Last 3 Encounters:  06/06/17 144 lb (65.3 kg)  01/12/17 147 lb (66.7 kg)  12/30/16 141 lb (64 kg)     Physical Exam Constitutional: She appears well-developed and well-nourished. No distress.  HENT:  Head: Normocephalic and atraumatic.  Right Ear: External ear normal. Normal ear canal and TM Left Ear: External ear normal.  Normal ear canal and TM Mouth/Throat: Oropharynx is clear and moist.  Eyes: Conjunctivae and EOM are normal.  Neck: Neck supple. No tracheal deviation present. No thyromegaly present.  No carotid bruit  Cardiovascular: Normal rate, regular rhythm and normal heart sounds.   No murmur heard.  No edema. Pulmonary/Chest: Effort normal and breath sounds normal. No respiratory distress. She has no wheezes. She has no rales.  Breast: deferred to Gyn Abdominal: Soft. She exhibits no distension. There is no tenderness.  Lymphadenopathy: She has no  cervical adenopathy.  Skin: Skin is warm and dry. She is not diaphoretic.  Psychiatric: She has a normal mood and affect. Her behavior is normal.        Assessment &  Plan:   Physical exam: Screening blood work     blood work ordered Immunizations   discussed shingles vaccine, others up-to-date Colonoscopy    up-to-date Mammogram    up-to-date Gyn   up-to-date Dexa   -due 05/2017, osteopenia - ordered Eye exams   Up to date  EKG    last done 2015 Exercise   Walking some - not always regular Weight    normal BMI Skin  No concerns Substance abuse    none  See Problem List for Assessment and Plan of chronic medical problems.

## 2017-06-06 ENCOUNTER — Encounter: Payer: Self-pay | Admitting: Internal Medicine

## 2017-06-06 ENCOUNTER — Other Ambulatory Visit (INDEPENDENT_AMBULATORY_CARE_PROVIDER_SITE_OTHER): Payer: BC Managed Care – PPO

## 2017-06-06 ENCOUNTER — Ambulatory Visit (INDEPENDENT_AMBULATORY_CARE_PROVIDER_SITE_OTHER): Payer: BC Managed Care – PPO | Admitting: Internal Medicine

## 2017-06-06 VITALS — BP 130/88 | HR 60 | Temp 98.5°F | Resp 16 | Ht 64.0 in | Wt 144.0 lb

## 2017-06-06 DIAGNOSIS — M85852 Other specified disorders of bone density and structure, left thigh: Secondary | ICD-10-CM | POA: Diagnosis not present

## 2017-06-06 DIAGNOSIS — M85851 Other specified disorders of bone density and structure, right thigh: Secondary | ICD-10-CM

## 2017-06-06 DIAGNOSIS — I10 Essential (primary) hypertension: Secondary | ICD-10-CM

## 2017-06-06 DIAGNOSIS — R7303 Prediabetes: Secondary | ICD-10-CM | POA: Diagnosis not present

## 2017-06-06 DIAGNOSIS — K219 Gastro-esophageal reflux disease without esophagitis: Secondary | ICD-10-CM | POA: Diagnosis not present

## 2017-06-06 DIAGNOSIS — Z0000 Encounter for general adult medical examination without abnormal findings: Secondary | ICD-10-CM | POA: Diagnosis not present

## 2017-06-06 LAB — COMPREHENSIVE METABOLIC PANEL
ALBUMIN: 4.2 g/dL (ref 3.5–5.2)
ALK PHOS: 89 U/L (ref 39–117)
ALT: 15 U/L (ref 0–35)
AST: 15 U/L (ref 0–37)
BILIRUBIN TOTAL: 0.5 mg/dL (ref 0.2–1.2)
BUN: 10 mg/dL (ref 6–23)
CO2: 29 mEq/L (ref 19–32)
Calcium: 9.3 mg/dL (ref 8.4–10.5)
Chloride: 104 mEq/L (ref 96–112)
Creatinine, Ser: 0.75 mg/dL (ref 0.40–1.20)
GFR: 81.46 mL/min (ref >60.00)
GLUCOSE: 102 mg/dL — AB (ref 70–99)
Potassium: 3.9 mEq/L (ref 3.5–5.1)
SODIUM: 140 meq/L (ref 135–145)
TOTAL PROTEIN: 7.2 g/dL (ref 6.0–8.3)

## 2017-06-06 LAB — CBC WITH DIFFERENTIAL/PLATELET
Basophils Absolute: 0.1 10*3/uL (ref 0.0–0.1)
Basophils Relative: 1.1 % (ref 0.0–3.0)
EOS PCT: 1.4 % (ref 0.0–5.0)
Eosinophils Absolute: 0.1 10*3/uL (ref 0.0–0.7)
HCT: 39.7 % (ref 36.0–46.0)
Hemoglobin: 13.3 g/dL (ref 12.0–15.0)
LYMPHS ABS: 1.7 10*3/uL (ref 0.7–4.0)
Lymphocytes Relative: 29.6 % (ref 12.0–46.0)
MCHC: 33.5 g/dL (ref 30.0–36.0)
MCV: 86.4 fl (ref 78.0–100.0)
MONOS PCT: 6.9 % (ref 3.0–12.0)
Monocytes Absolute: 0.4 10*3/uL (ref 0.1–1.0)
NEUTROS ABS: 3.6 10*3/uL (ref 1.4–7.7)
NEUTROS PCT: 61 % (ref 43.0–77.0)
PLATELETS: 238 10*3/uL (ref 150.0–400.0)
RBC: 4.6 Mil/uL (ref 3.87–5.11)
RDW: 13.4 % (ref 11.5–15.5)
WBC: 5.9 10*3/uL (ref 4.0–10.5)

## 2017-06-06 LAB — HEMOGLOBIN A1C: Hgb A1c MFr Bld: 5.8 % (ref 4.6–6.5)

## 2017-06-06 LAB — LIPID PANEL
Cholesterol: 182 mg/dL (ref 0–200)
HDL: 58.3 mg/dL (ref >39.00)
LDL Cholesterol: 108 mg/dL — ABNORMAL HIGH (ref 0–99)
NONHDL: 124.05
Total CHOL/HDL Ratio: 3
Triglycerides: 80 mg/dL (ref 0.0–149.0)
VLDL: 16 mg/dL (ref 0.0–40.0)

## 2017-06-06 LAB — TSH: TSH: 2.52 u[IU]/mL (ref 0.35–4.50)

## 2017-06-06 NOTE — Assessment & Plan Note (Signed)
GERD controlled Continue daily medication  

## 2017-06-06 NOTE — Assessment & Plan Note (Signed)
Check a1c Low sugar / carb diet Stressed regular exercise   

## 2017-06-06 NOTE — Assessment & Plan Note (Signed)
BP well controlled Current regimen effective and well tolerated Continue current medications at current doses Cmp, tsh, cbc 

## 2017-06-06 NOTE — Assessment & Plan Note (Signed)
Taking calcium and vitamin d  Walking some - not regularly - stressed regular exercise Due for dexa - ordered

## 2017-06-08 ENCOUNTER — Encounter: Payer: Self-pay | Admitting: Internal Medicine

## 2017-06-20 LAB — HM MAMMOGRAPHY

## 2017-06-27 ENCOUNTER — Ambulatory Visit (INDEPENDENT_AMBULATORY_CARE_PROVIDER_SITE_OTHER)
Admission: RE | Admit: 2017-06-27 | Discharge: 2017-06-27 | Disposition: A | Discharge status: Home / Self Care | Payer: BC Managed Care – PPO | Source: Ambulatory Visit | Attending: Internal Medicine | Admitting: Internal Medicine

## 2017-06-27 ENCOUNTER — Ambulatory Visit: Payer: BC Managed Care – PPO | Admitting: Internal Medicine

## 2017-06-27 ENCOUNTER — Encounter: Payer: Self-pay | Admitting: Internal Medicine

## 2017-06-27 VITALS — BP 134/96 | HR 96 | Temp 98.8°F | Resp 16 | Wt 148.0 lb

## 2017-06-27 DIAGNOSIS — R05 Cough: Secondary | ICD-10-CM

## 2017-06-27 DIAGNOSIS — J209 Acute bronchitis, unspecified: Secondary | ICD-10-CM

## 2017-06-27 DIAGNOSIS — R059 Cough, unspecified: Secondary | ICD-10-CM

## 2017-06-27 MED ORDER — AMOXICILLIN-POT CLAVULANATE 875-125 MG PO TABS: 1.0000 | ORAL_TABLET | Freq: Two times a day (BID) | ORAL | 0 refills | Status: DC

## 2017-06-27 NOTE — Patient Instructions (Addendum)
Have a chest xray today.  We will call you with the results.   An antibiotic was sent to your pharmacy.   Continue the over the counter cold medications.   Call if no improvement

## 2017-06-27 NOTE — Assessment & Plan Note (Signed)
Probable bacterial - will try an antibiotic augmentin bid x 10 days Continue otc cold meds CXR today - unlikely PNA, but will r/o Rest, fluids  Call if no improvement

## 2017-06-27 NOTE — Progress Notes (Signed)
Subjective:    Patient ID: Kendra Leonard, female    DOB: 09-20-1950, 67 y.o.   MRN: [id number]  HPI She is here for an acute visit for cold symptoms.  Her symptoms started just over one week ago.   She was experiencing a dry hacking cough that worsened to a productive cough. She has some chest tightness and chest discomfort.  She has some SOB.   She also states mild nasal congestion, sinus pressure a couple of days ago that improved, intermittent ear pain, and headaches a couple of days ago.  She denies fever, chills and wheeze.  She has other family members that are sick as well.   She has taken mucinex x 1, nyquil - day and night, coricidin cold products, alka seltzer plus  Medications and allergies reviewed with patient and updated if appropriate.  Patient Active Problem List   Diagnosis Date Noted  . Osteopenia 06/07/2016  . Prediabetes 06/07/2016  . Irritable bowel syndrome (IBS) 11/03/2014  . Hematuria, microscopic 04/27/2011  . DIVERTICULITIS, HX OF 05/28/2009  . DEGENERATIVE JOINT DISEASE, KNEE 04/22/2008  . POPLITEAL CYST, RIGHT 04/22/2008  . COLONIC POLYPS, HX OF 01/28/2008  . Essential hypertension 01/26/2007  . EROSIVE ESOPHAGITIS 12/22/2006  . DIVERTICULOSIS, COLON 12/22/2006  . GERD 10/24/2006  . COMMON MIGRAINE 06/27/2006    Current Outpatient Medications on File Prior to Visit  Medication Sig Dispense Refill  . benazepril (LOTENSIN) 40 MG tablet Take 40 mg daily by mouth.    . Calcium Carbonate-Vitamin D (CALCIUM + D PO) Take by mouth daily.    . Cholecalciferol (VITAMIN D-3) 1000 UNITS CAPS Take by mouth daily.    . Omega-3 Fatty Acids (FISH OIL) 1200 MG CAPS Take by mouth daily.    Marland Kitchen omeprazole (PRILOSEC) 20 MG capsule TAKE 1 CAPSULE 30 MINUTES  BEFORE BREAKFAST 90 capsule 3  . TURMERIC CURCUMIN PO Take 1,000 mg by mouth daily.     No current facility-administered medications on file prior to visit.     Past Medical History:  Diagnosis Date  .  Colon polyp 2008   3 mm  . DJD (degenerative joint disease) of knee    Dr Maureen Ralphs  . GERD (gastroesophageal reflux disease) 2008   Dr Carlean Purl  . Hypertension     Past Surgical History:  Procedure Laterality Date  . CYSTOSCOPY     X4 for microscopic hematuria  . G 2  P 2    . intraarticular steroid injection Right 2013 & 2014   Dr Maureen Ralphs  . LAPAROSCOPY     For Infertility work up  . UPPER GI ENDOSCOPY  2008   Dr Carlean Purl  . WISDOM TOOTH EXTRACTION      Social History   Socioeconomic History  . Marital status: Married    Spouse name: Not on file  . Number of children: Not on file  . Years of education: Not on file  . Highest education level: Not on file  Occupational History  . Not on file  Social Needs  . Financial resource strain: Not on file  . Food insecurity:    Worry: Not on file    Inability: Not on file  . Transportation needs:    Medical: Not on file    Non-medical: Not on file  Tobacco Use  . Smoking status: Never Smoker  . Smokeless tobacco: Never Used  Substance and Sexual Activity  . Alcohol use: Yes    Comment: Socially   . Drug  use: No  . Sexual activity: Not on file  Lifestyle  . Physical activity:    Days per week: Not on file    Minutes per session: Not on file  . Stress: Not on file  Relationships  . Social connections:    Talks on phone: Not on file    Gets together: Not on file    Attends religious service: Not on file    Active member of club or organization: Not on file    Attends meetings of clubs or organizations: Not on file    Relationship status: Not on file  Other Topics Concern  . Not on file  Social History Narrative   Walking regularly for exercise    Family History  Problem Relation Age of Onset  . Prostate cancer Father 25  . Hypertension Father   . Aneurysm Father        Thoracic Aneurysm  . Dysrhythmia Father   . GER disease Father        PMH DUD  . Dementia Father   . Parkinsonism Father   . Uterine cancer  Mother 5  . Hypertension Mother   . Other Mother        colovaginal fistula  . Hypertension Brother   . Diabetes Maternal Aunt   . Diabetes Maternal Uncle   . Tremor Maternal Aunt        2 aunts ( no FH Parkinson's)  . Stroke Maternal Grandfather        >55  . GER disease Brother   . Colon cancer Neg Hx   . Colon polyps Neg Hx   . Rectal cancer Neg Hx   . Stomach cancer Neg Hx     Review of Systems  Constitutional: Negative for chills and fever.  HENT: Positive for congestion (mild), ear pain (left ear pain intermittently, b/l ear pressure), sinus pressure (improved) and sinus pain. Negative for sore throat.   Respiratory: Positive for cough (productive), chest tightness and shortness of breath. Negative for wheezing.   Cardiovascular: Positive for chest pain.  Neurological: Positive for headaches (2-3 days ago).       Objective:   Vitals:   06/27/17 1459  BP: (!) 134/96  Pulse: 96  Resp: 16  Temp: 98.8 F (37.1 C)  SpO2: 97%   Filed Weights   06/27/17 1459  Weight: 148 lb (67.1 kg)   Body mass index is 25.4 kg/m.  Wt Readings from Last 3 Encounters:  06/27/17 148 lb (67.1 kg)  06/06/17 144 lb (65.3 kg)  01/12/17 147 lb (66.7 kg)     Physical Exam GENERAL APPEARANCE: Appears stated age, well appearing, NAD EYES: conjunctiva clear, no icterus HEENT: bilateral tympanic membranes and ear canals normal, oropharynx with no erythema, no thyromegaly, trachea midline, no cervical or supraclavicular lymphadenopathy LUNGS: Clear to auscultation without wheeze or crackles, unlabored breathing, good air entry bilaterally CARDIOVASCULAR: Normal S1,S2 without murmurs, no edema SKIN: warm, dry        Assessment & Plan:   See Problem List for Assessment and Plan of chronic medical problems.

## 2017-06-29 ENCOUNTER — Encounter: Payer: Self-pay | Admitting: Internal Medicine

## 2017-08-07 ENCOUNTER — Other Ambulatory Visit: Payer: Self-pay | Admitting: Internal Medicine

## 2017-09-25 ENCOUNTER — Other Ambulatory Visit: Payer: Self-pay

## 2017-09-25 MED ORDER — BENAZEPRIL HCL 40 MG PO TABS: 40.0000 mg | ORAL_TABLET | Freq: Every day | ORAL | 1 refills | Status: DC

## 2017-10-31 HISTORY — PX: ABDOMINAL HYSTERECTOMY: SHX81

## 2017-11-02 ENCOUNTER — Encounter: Payer: Self-pay | Admitting: Internal Medicine

## 2017-11-02 ENCOUNTER — Ambulatory Visit (INDEPENDENT_AMBULATORY_CARE_PROVIDER_SITE_OTHER): Payer: BC Managed Care – PPO

## 2017-11-02 DIAGNOSIS — Z23 Encounter for immunization: Secondary | ICD-10-CM | POA: Diagnosis not present

## 2017-11-02 NOTE — Progress Notes (Signed)
Patient came into the office to receive her flu vaccine. Patient received the vaccine in her right deltoid & tolerated injection well. No signs/symptoms of a reaction prior to leaving the exam room. VIS given to patient.

## 2017-11-08 DIAGNOSIS — C541 Malignant neoplasm of endometrium: Secondary | ICD-10-CM | POA: Insufficient documentation

## 2017-11-08 DIAGNOSIS — Z8542 Personal history of malignant neoplasm of other parts of uterus: Secondary | ICD-10-CM | POA: Insufficient documentation

## 2018-03-23 ENCOUNTER — Other Ambulatory Visit: Payer: Self-pay | Admitting: Internal Medicine

## 2018-05-31 ENCOUNTER — Telehealth: Payer: Self-pay

## 2018-05-31 NOTE — Telephone Encounter (Signed)
Ok - the most recent thing from [facility] is from 2018 -- was there anything more recent she is referring to?

## 2018-05-31 NOTE — Telephone Encounter (Signed)
LVM for pt to call back and clarify.

## 2018-05-31 NOTE — Telephone Encounter (Signed)
Looked back in care everywhere and was able to pull up records. In the top of the care everywhere there is a button that says request updates.

## 2018-05-31 NOTE — Telephone Encounter (Signed)
Copied from [place] [id number]. Topic: General - Other >> May 30, 2018  3:08 PM Carolyn Stare wrote:  Pt said she has an appt on 5.4..2020 and would like Dr Quay Burow to review her [facility] Chart before they talk

## 2018-06-03 NOTE — Progress Notes (Signed)
Virtual Visit via Video Note-converted into a telephone visit due to failed virtual visit due to problems connecting  I connected with Kendra Leonard on 06/04/18 at 10:30 AM EDT by telephone and verified that I am speaking with the correct person using two identifiers.   I discussed the limitations of evaluation and management by telemedicine and the availability of in person appointments. The patient expressed understanding and agreed to proceed.  The patient is currently at home and I am in the office.    No referring provider.    History of Present Illness: She is here for follow up of her chronic medical conditions.   She is not exercising regularly.    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  She is doing well postoperatively.  She had chemotherapy and local radiotherapy to the vaginal cuff.  She has completed treatment.  Her most recent CT scan looked good.  She has neuropathy in her fingers and toes and she is taking gabapentin 300 mg TID.  The medication helps slightly.  Snores: She snores a lot and her husband does complain about her snoring.  She knows that she does have a deviated septum and she wonders if that is the cause.  She has no known apnea.  She states she would not tolerate a CPAP machine if she needed 1 and therefore does not think she wants to be tested.  She wondered if she needs to see an ENT.  Right shoulder pain:  It started hurting a while ago, but went away.  In march her shoulder starting hurting again.  She has good ROM, but it sometimes hurts when raising her arm.  It sometimes pops.  It is getting a little better.   Hypertension: She is taking her medication daily. She is compliant with a low sodium diet.  She denies chest pain, palpitations, edema and regular headaches. She does not monitor her blood pressure at home.    Prediabetes:  She is compliant with a low sugar/carbohydrate diet.  She is not  exercising regularly.  GERD:  She is taking her medication daily as prescribed.  She has occasional GERD symptoms, but feels her GERD is well controlled.    Review of Systems  Constitutional: Negative for chills and fever.  Respiratory: Positive for cough (allergy related) and shortness of breath (due to chemo). Negative for wheezing.   Cardiovascular: Negative for chest pain, palpitations and leg swelling.  Gastrointestinal: Positive for heartburn (every once in a while).  Neurological: Negative for headaches.      Social History   Socioeconomic History  . Marital status: Married    Spouse name: Not on file  . Number of children: Not on file  . Years of education: Not on file  . Highest education level: Not on file  Occupational History  . Not on file  Social Needs  . Financial resource strain: Not on file  . Food insecurity:    Worry: Not on file    Inability: Not on file  . Transportation needs:    Medical: Not on file    Non-medical: Not on file  Tobacco Use  . Smoking status: Never Smoker  . Smokeless tobacco: Never Used  Substance and Sexual Activity  . Alcohol use: Yes    Comment: Socially   . Drug use: No  . Sexual activity: Not on file  Lifestyle  . Physical activity:    Days per week: Not on  file    Minutes per session: Not on file  . Stress: Not on file  Relationships  . Social connections:    Talks on phone: Not on file    Gets together: Not on file    Attends religious service: Not on file    Active member of club or organization: Not on file    Attends meetings of clubs or organizations: Not on file    Relationship status: Not on file  Other Topics Concern  . Not on file  Social History Narrative   Walking regularly for exercise     Observations/Objective:    Assessment and Plan:  See Problem List for Assessment and Plan of chronic medical problems.   Follow Up Instructions:    I discussed the assessment and treatment plan with the  patient. The patient was provided an opportunity to ask questions and all were answered. The patient agreed with the plan and demonstrated an understanding of the instructions.   The patient was advised to call back or seek an in-person evaluation if the symptoms worsen or if the condition fails to improve as anticipated.    Binnie Rail, MD

## 2018-06-04 ENCOUNTER — Ambulatory Visit: Payer: BC Managed Care – PPO | Admitting: Internal Medicine

## 2018-06-04 ENCOUNTER — Ambulatory Visit (INDEPENDENT_AMBULATORY_CARE_PROVIDER_SITE_OTHER): Payer: BC Managed Care – PPO | Admitting: Internal Medicine

## 2018-06-04 ENCOUNTER — Encounter: Payer: Self-pay | Admitting: Internal Medicine

## 2018-06-04 DIAGNOSIS — I10 Essential (primary) hypertension: Secondary | ICD-10-CM | POA: Diagnosis not present

## 2018-06-04 DIAGNOSIS — C541 Malignant neoplasm of endometrium: Secondary | ICD-10-CM | POA: Diagnosis not present

## 2018-06-04 DIAGNOSIS — Z1231 Encounter for screening mammogram for malignant neoplasm of breast: Secondary | ICD-10-CM

## 2018-06-04 DIAGNOSIS — K219 Gastro-esophageal reflux disease without esophagitis: Secondary | ICD-10-CM

## 2018-06-04 DIAGNOSIS — M25511 Pain in right shoulder: Secondary | ICD-10-CM

## 2018-06-04 DIAGNOSIS — R0683 Snoring: Secondary | ICD-10-CM

## 2018-06-04 DIAGNOSIS — T451X5A Adverse effect of antineoplastic and immunosuppressive drugs, initial encounter: Secondary | ICD-10-CM

## 2018-06-04 DIAGNOSIS — G620 Drug-induced polyneuropathy: Secondary | ICD-10-CM | POA: Insufficient documentation

## 2018-06-04 DIAGNOSIS — R7303 Prediabetes: Secondary | ICD-10-CM

## 2018-06-04 MED ORDER — OMEPRAZOLE 20 MG PO CPDR: DELAYED_RELEASE_CAPSULE | ORAL | 3 refills | Status: DC

## 2018-06-04 MED ORDER — BENAZEPRIL HCL 40 MG PO TABS: 40.0000 mg | ORAL_TABLET | Freq: Every day | ORAL | 1 refills | Status: DC

## 2018-06-04 NOTE — Assessment & Plan Note (Signed)
Overall controlled, she only has occasional GERD symptoms Continue omeprazole 20 mg daily

## 2018-06-04 NOTE — Assessment & Plan Note (Signed)
BP Readings from Last 3 Encounters:  06/27/17 (!) 134/96  06/06/17 130/88  01/12/17 124/74   Continue current medication at current dose

## 2018-06-04 NOTE — Assessment & Plan Note (Signed)
Has pain and occasional popping.  Increased pain with lifting arm up.  Has good range of motion Pain has improved slightly She will continue to monitor and let me know if she wants referral

## 2018-06-04 NOTE — Assessment & Plan Note (Signed)
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

## 2018-06-04 NOTE — Assessment & Plan Note (Signed)
Neuropathy pain in fingers and toes Secondary to chemotherapy Taking gabapentin 300 mg 3 times daily, which helps a little

## 2018-06-04 NOTE — Assessment & Plan Note (Signed)
Not a new problem.  Bothers her husband more than it bothers her She has a known deviated septum and she wonders if that is the cause No other symptoms consistent with sleep apnea and she does not want to get tested because she knows she would not tolerate the machine Advised her to think if she wants to be referred to ENT to consider surgery for her deviated septum-she will let me know

## 2018-06-04 NOTE — Assessment & Plan Note (Signed)
Lab Results  Component Value Date   HGBA1C 5.8 06/06/2017   Encouraged low sugar/carbohydrate diet and regular exercise

## 2018-07-20 LAB — HM MAMMOGRAPHY

## 2018-07-24 ENCOUNTER — Encounter: Payer: Self-pay | Admitting: Internal Medicine

## 2018-07-27 ENCOUNTER — Telehealth: Payer: Self-pay | Admitting: Internal Medicine

## 2018-07-27 MED ORDER — METHOCARBAMOL 500 MG PO TABS: 500.0000 mg | ORAL_TABLET | Freq: Four times a day (QID) | ORAL | 0 refills | Status: DC

## 2018-07-27 NOTE — Telephone Encounter (Signed)
Done erx 

## 2018-07-27 NOTE — Telephone Encounter (Signed)
MD is out of the office pls advise../lmb 

## 2018-07-27 NOTE — Telephone Encounter (Signed)
Pt walked a trail without a knee brace on and now has inflammation behind her knee, preventing her from walking too much and wants to know if Dr. Quay Burow can prescribe something to help with the inflammation. Pt stated that she was prescribed methocarbamol (ROBAXIN) 500 MG tablet  By a doctor for it before. Derrek Monaco advise

## 2018-07-30 NOTE — Telephone Encounter (Signed)
Patient informed med was sent in. Inflammation is in the front and back of knee.

## 2018-11-06 ENCOUNTER — Encounter: Payer: Self-pay | Admitting: Internal Medicine

## 2018-11-06 ENCOUNTER — Ambulatory Visit (INDEPENDENT_AMBULATORY_CARE_PROVIDER_SITE_OTHER): Payer: BC Managed Care – PPO

## 2018-11-06 ENCOUNTER — Other Ambulatory Visit: Payer: Self-pay

## 2018-11-06 DIAGNOSIS — Z23 Encounter for immunization: Secondary | ICD-10-CM | POA: Diagnosis not present

## 2018-11-06 NOTE — Progress Notes (Signed)
Pt came into the office to receive her flu vaccine. Vaccine given in the right deltoid. Pt tolerated injection well. No signs/symptoms of a reaction prior to leaving the nurse visit. VIS given to pt.

## 2018-12-03 IMAGING — DX DG LUMBAR SPINE COMPLETE 4+V
5 series · 5 of 5 positions shown · non-contrast
Comparison: CT scan of April 28, 2009.

CLINICAL DATA: Low back pain for 2 months without known injury.

EXAM:
LUMBAR SPINE - COMPLETE 4+ VIEW

[l-spine ap]
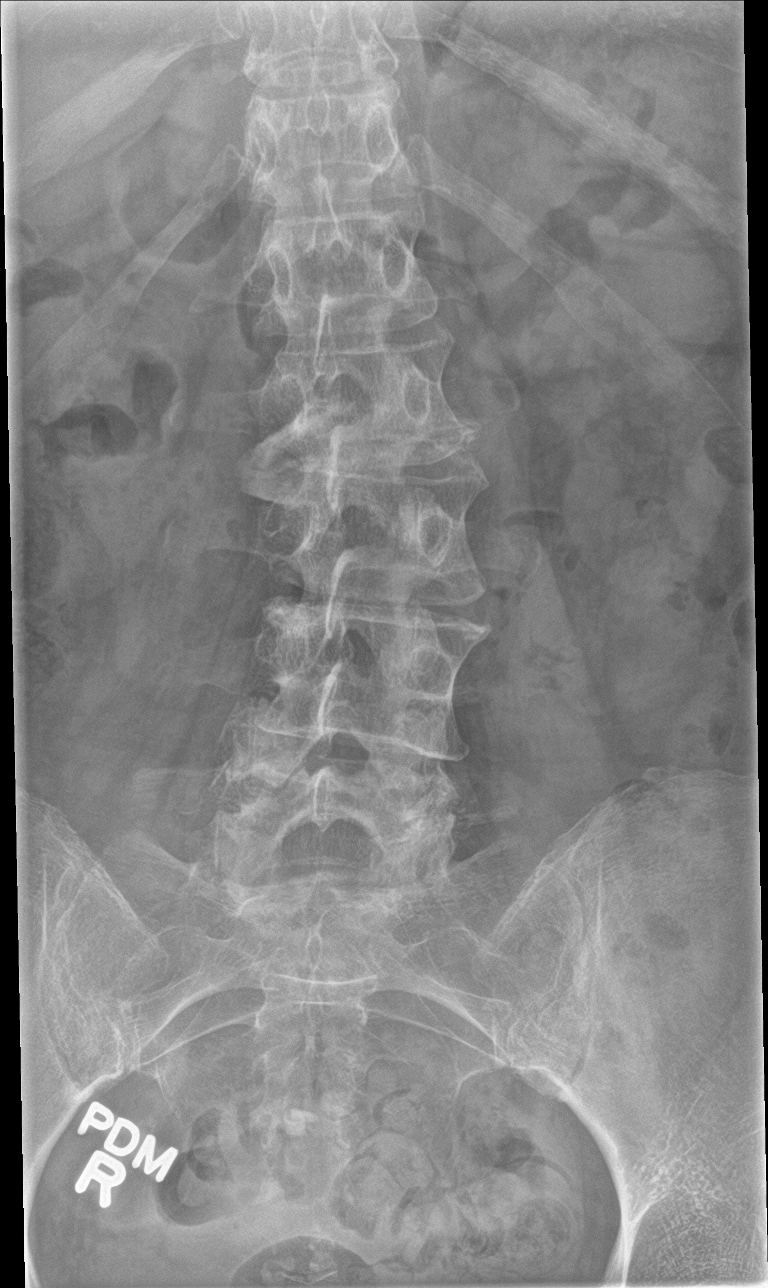

[l-spine obl (1 of 2)]
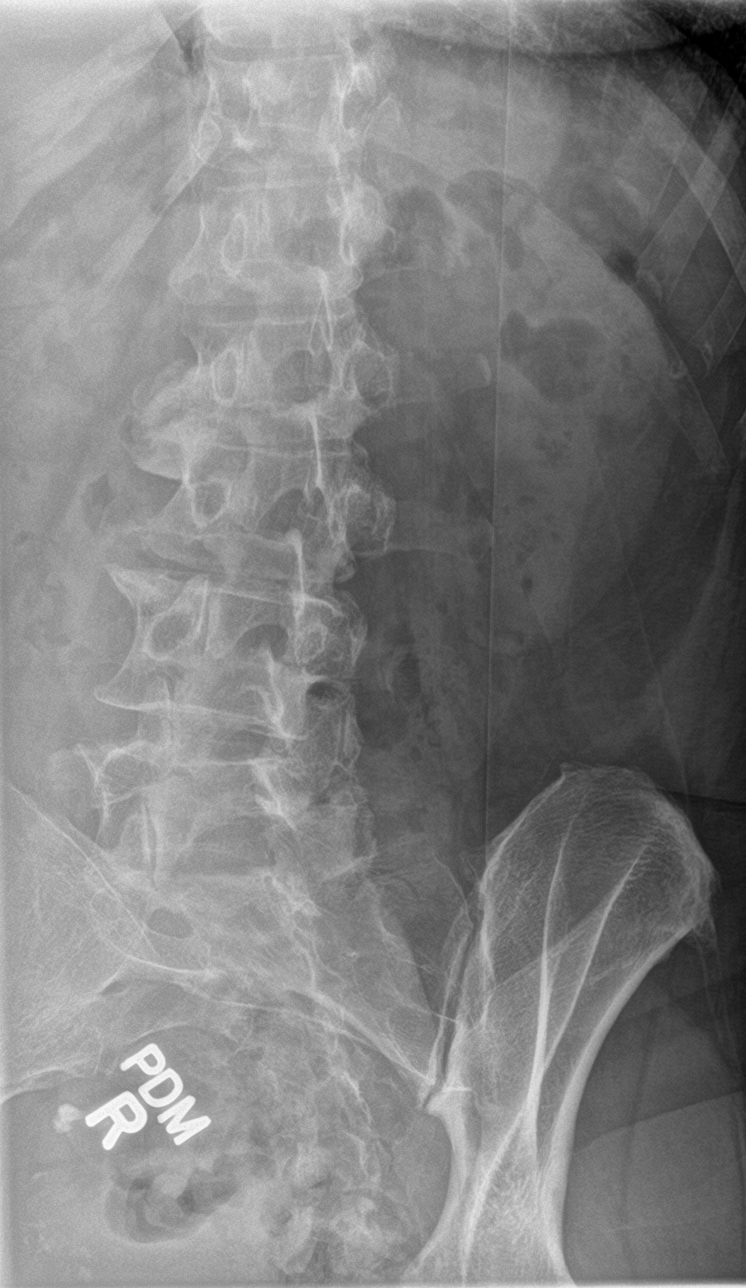

[l-spine obl (2 of 2)]
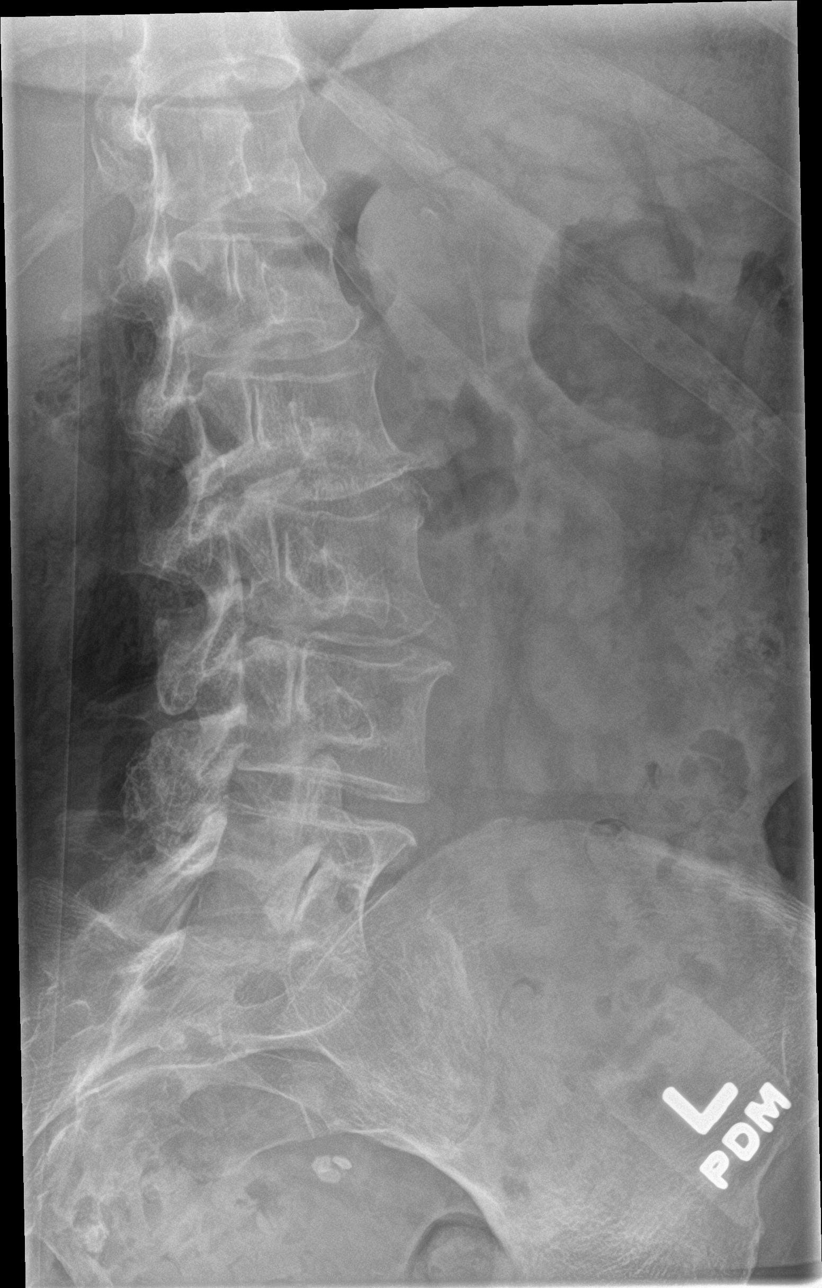

[l-spine lat]
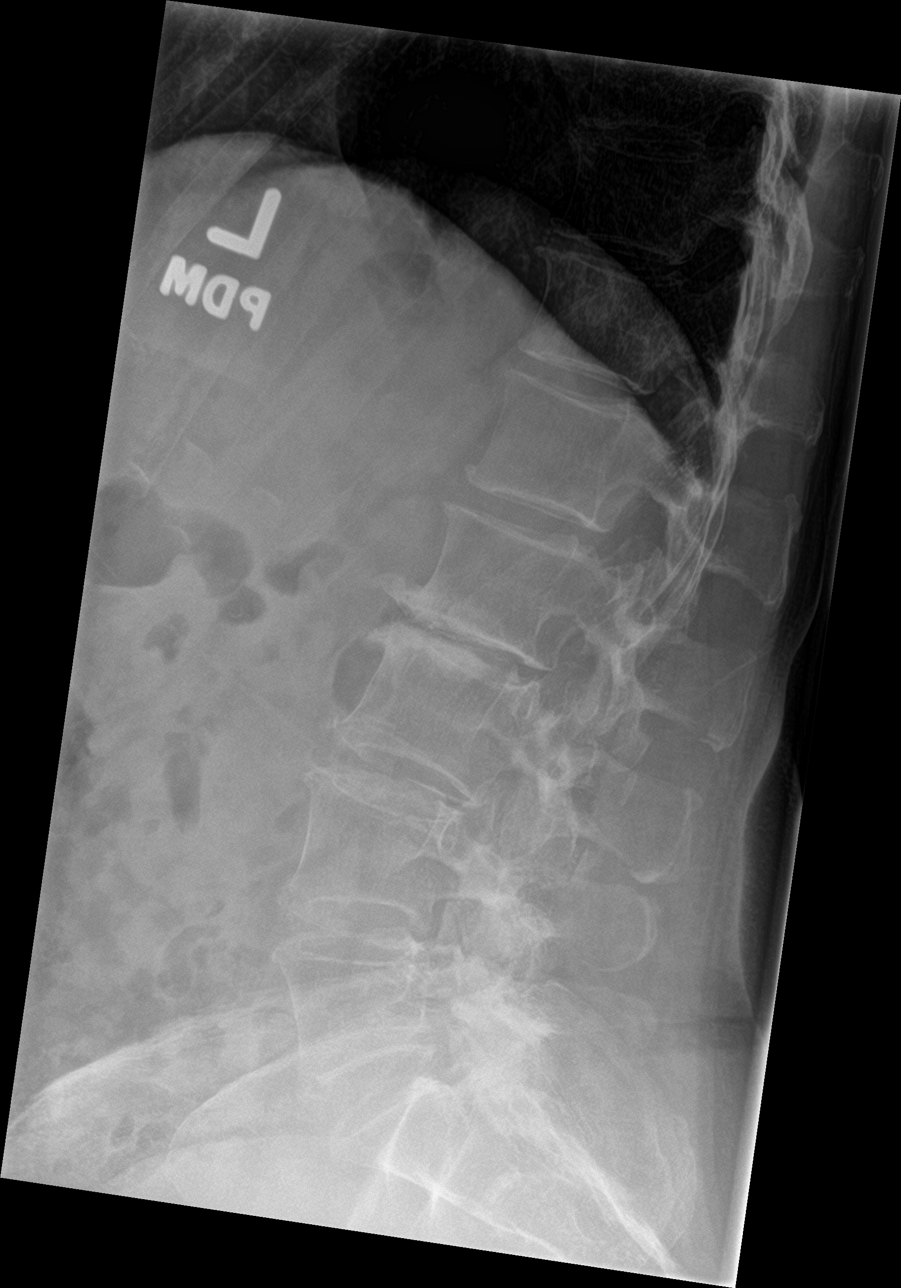

[l-spine spot]
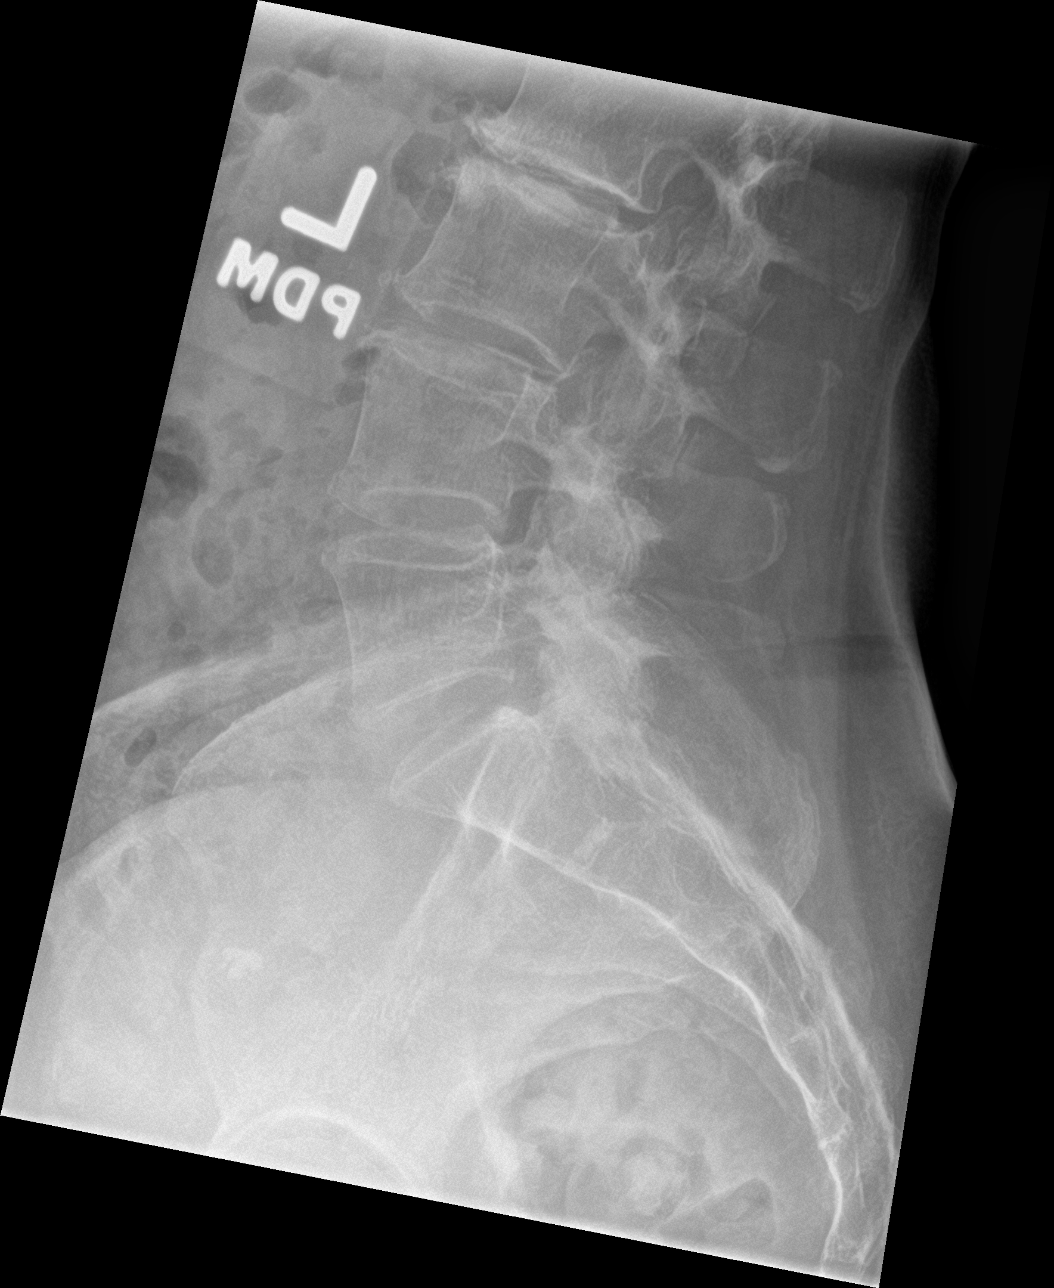

[5 of 5 positions shown; findings below may reference images not displayed]

FINDINGS: Mild levoscoliosis of lumbar spine is noted. No fracture or
spondylolisthesis is noted. Moderate degenerative disc disease is
noted at L2-3 and L3-4 with anterior osteophyte formation.
IMPRESSION: Multilevel degenerative disc disease is noted. No acute abnormality
seen in the lumbar spine.

## 2019-03-06 ENCOUNTER — Other Ambulatory Visit: Payer: Self-pay | Admitting: Internal Medicine

## 2019-03-12 ENCOUNTER — Telehealth: Payer: Self-pay

## 2019-03-12 NOTE — Telephone Encounter (Signed)
New message    The patient wants to know the date of her last Shingles shot.   Please advise

## 2019-03-13 NOTE — Telephone Encounter (Signed)
Called pt to let her know when shingles shot received. Pt expressed understanding.

## 2019-06-04 NOTE — Progress Notes (Signed)
Subjective:    Patient ID: Kendra Leonard, female    DOB: [birth year], 69 y.o.   MRN: [id number]  HPI She is here for a physical exam.   She is doing well overall.  In 2019 she was diagnosed with uterine cancer and had surgery, chemo and radiation.  She follows with oncology regularly.  Her hair has come back.  She is still adjusting to having dealt with this.   She has gained weight and is not happy about that.   Medications and allergies reviewed with patient and updated if appropriate.  Patient Active Problem List   Diagnosis Date Noted  . 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 Right shoulder pain 06/04/2018  . Snoring 06/04/2018  . Adenocarcinoma of endometrium ([place]) 11/08/2017  . Osteopenia 06/07/2016  . Prediabetes 06/07/2016  . Irritable bowel syndrome (IBS) 11/03/2014  . Hematuria, microscopic 04/27/2011  . DIVERTICULITIS, HX OF 05/28/2009  . DEGENERATIVE JOINT DISEASE, KNEE 04/22/2008  . POPLITEAL CYST, RIGHT 04/22/2008  . COLONIC POLYPS, HX OF 01/28/2008  . Essential hypertension 01/26/2007  . EROSIVE ESOPHAGITIS 12/22/2006  . DIVERTICULOSIS, COLON 12/22/2006  . GERD 10/24/2006  . COMMON MIGRAINE 06/27/2006    Current Outpatient Medications on File Prior to Visit  Medication Sig Dispense Refill  . benazepril (LOTENSIN) 40 MG tablet Take 1 tablet (40 mg total) by mouth daily. Annual appt due in May must see provider for future refills 90 tablet 0  . Calcium Carbonate-Vitamin D (CALCIUM + D PO) Take by mouth daily.    . Cholecalciferol (VITAMIN D-3) 1000 UNITS CAPS Take by mouth daily.    Marland Kitchen gabapentin (NEURONTIN) 300 MG capsule Take by mouth.    . Omega-3 Fatty Acids (FISH OIL) 1200 MG CAPS Take by mouth daily.    Marland Kitchen omeprazole (PRILOSEC) 20 MG capsule TAKE 1 CAPSULE 30 MINUTES  BEFORE BREAKFAST *SANDOZ   MFR* 90 capsule 3  . Turmeric 500 MG CAPS Take by mouth.     No current facility-administered medications on file prior to visit.     Past Medical History:  Diagnosis Date  . Colon polyp 2008   3 mm  . DJD (degenerative joint disease) of knee    Dr Maureen Ralphs  . GERD (gastroesophageal reflux disease) 2008   Dr Carlean Purl  . Hypertension     Past Surgical History:  Procedure Laterality Date  . CYSTOSCOPY     X4 for microscopic hematuria  . G 2  P 2    . intraarticular steroid injection Right 2013 & 2014   Dr Maureen Ralphs  . LAPAROSCOPY     For Infertility work up  . UPPER GI ENDOSCOPY  2008   Dr Carlean Purl  . WISDOM TOOTH EXTRACTION      Social History   Socioeconomic History  . Marital status: Married    Spouse name: Not on file  . Number of children: Not on file  . Years of education: Not on file  . Highest education level: Not on file  Occupational History  . Not on file  Tobacco Use  . Smoking status: Never Smoker  . Smokeless tobacco: Never Used  Substance and Sexual Activity  . Alcohol use: Yes    Comment: Socially   . Drug use: No  . Sexual activity: Not on file  Other Topics Concern  . Not on file  Social History Narrative   Walking regularly for exercise   Social Determinants of Health   Financial Resource Strain:   .  Difficulty of Paying Living Expenses:   Food Insecurity:   . Worried About Charity fundraiser in the Last Year:   . Arboriculturist in the Last Year:   Transportation Needs:   . Film/video editor (Medical):   Marland Kitchen Lack of Transportation (Non-Medical):   Physical Activity:   . Days of Exercise per Week:   . Minutes of Exercise per Session:   Stress:   . Feeling of Stress :   Social Connections:   . Frequency of Communication with Friends and Family:   . Frequency of Social Gatherings with Friends and Family:   . Attends Religious Services:   . Active Member of Clubs or Organizations:   . Attends Archivist Meetings:   Marland Kitchen Marital Status:     Family History  Problem Relation Age of Onset  . Prostate cancer Father 31  . Hypertension Father   . Aneurysm  Father        Thoracic Aneurysm  . Dysrhythmia Father   . GER disease Father        PMH DUD  . Dementia Father   . Parkinsonism Father   . Uterine cancer Mother 62  . Hypertension Mother   . Other Mother        colovaginal fistula  . Hypertension Brother   . Diabetes Maternal Aunt   . Diabetes Maternal Uncle   . Tremor Maternal Aunt        2 aunts ( no FH Parkinson's)  . Stroke Maternal Grandfather        >55  . GER disease Brother   . Colon cancer Neg Hx   . Colon polyps Neg Hx   . Rectal cancer Neg Hx   . Stomach cancer Neg Hx     Review of Systems  Constitutional: Negative for chills and fever.  Eyes: Negative for visual disturbance.  Respiratory: Positive for shortness of breath (occ moderate  exertion at times). Negative for cough and wheezing.   Cardiovascular: Negative for chest pain, palpitations and leg swelling.  Gastrointestinal: Positive for diarrhea (occasional). Negative for abdominal pain, blood in stool, constipation and nausea.       Occ gerd  Genitourinary: Negative for dysuria and hematuria.  Musculoskeletal: Positive for arthralgias (knees).  Skin: Negative for color change and rash.  Neurological: Negative for dizziness, light-headedness and headaches.  Psychiatric/Behavioral: Negative for dysphoric mood. The patient is not nervous/anxious.        Objective:   Vitals:   06/05/19 0854  BP: 118/70  Pulse: 72  Resp: 16  Temp: 98.7 F (37.1 C)  SpO2: 99%   Filed Weights   06/05/19 0854  Weight: 156 lb (70.8 kg)   Body mass index is 26.78 kg/m.  BP Readings from Last 3 Encounters:  06/05/19 118/70  06/27/17 (!) 134/96  06/06/17 130/88    Wt Readings from Last 3 Encounters:  06/05/19 156 lb (70.8 kg)  06/27/17 148 lb (67.1 kg)  06/06/17 144 lb (65.3 kg)     Physical Exam Constitutional: She appears well-developed and well-nourished. No distress.  HENT:  Head: Normocephalic and atraumatic.  Right Ear: External ear normal. Normal  ear canal and TM Left Ear: External ear normal.  Normal ear canal and TM Mouth/Throat: Oropharynx is clear and moist.  Eyes: Conjunctivae and EOM are normal.  Neck: Neck supple. No tracheal deviation present. No thyromegaly present.  No carotid bruit  Cardiovascular: Normal rate, regular rhythm and normal heart sounds.  No murmur heard.  No edema. Pulmonary/Chest: Effort normal and breath sounds normal. No respiratory distress. She has no wheezes. She has no rales.  Breast: deferred   Abdominal: Soft. She exhibits no distension. There is no tenderness.  Lymphadenopathy: She has no cervical adenopathy.  Skin: Skin is warm and dry. She is not diaphoretic.  Psychiatric: She has a normal mood and affect. Her behavior is normal.        Assessment & Plan:   Physical exam: Screening blood work    Deferred - she will have basic labs soon with oncology and prefers not to be stuck Immunizations  Discussed tdap, shingrix, other up to date Colonoscopy   Up to date  Mammogram  Up to date  Gyn  - just goes to [facility]   Due -   Ordered for [employer] exams  Up to date Exercise  Walking intermittently, but not consistently - stressed importance of exercise for several reasons Weight  BMI  In overweight  - will work on weight loss Substance abuse   none  See Problem List for Assessment and Plan of chronic medical problems.     This visit occurred during the SARS-CoV-2 public health emergency.  Safety protocols were in place, including screening questions prior to the visit, additional usage of staff PPE, and extensive cleaning of exam room while observing appropriate contact time as indicated for disinfecting solutions.

## 2019-06-04 NOTE — Patient Instructions (Addendum)
Blood work was deferred.    All other Health Maintenance issues reviewed.   All recommended immunizations and age-appropriate screenings are up-to-date or discussed.  No immunization administered today.   Medications reviewed and updated.  Changes include :  none   Your prescription(s) have been submitted to your pharmacy. Please take as directed and contact our office if you believe you are having problem(s) with the medication(s).    Please followup in 1 year    Health Maintenance, Female Adopting a healthy lifestyle and getting preventive care are important in promoting health and wellness. Ask your health care provider about:  The right schedule for you to have regular tests and exams.  Things you can do on your own to prevent diseases and keep yourself healthy. What should I know about diet, weight, and exercise? Eat a healthy diet   Eat a diet that includes plenty of vegetables, fruits, low-fat dairy products, and lean protein.  Do not eat a lot of foods that are high in solid fats, added sugars, or sodium. Maintain a healthy weight Body mass index (BMI) is used to identify weight problems. It estimates body fat based on height and weight. Your health care provider can help determine your BMI and help you achieve or maintain a healthy weight. Get regular exercise Get regular exercise. This is one of the most important things you can do for your health. Most adults should:  Exercise for at least 150 minutes each week. The exercise should increase your heart rate and make you sweat (moderate-intensity exercise).  Do strengthening exercises at least twice a week. This is in addition to the moderate-intensity exercise.  Spend less time sitting. Even light physical activity can be beneficial. Watch cholesterol and blood lipids Have your blood tested for lipids and cholesterol at 71 years of age, then have this test every 5 years. Have your cholesterol levels checked more  often if:  Your lipid or cholesterol levels are high.  You are older than 71 years of age.  You are at high risk for heart disease. What should I know about cancer screening? Depending on your health history and family history, you may need to have cancer screening at various ages. This may include screening for:  Breast cancer.  Cervical cancer.  Colorectal cancer.  Skin cancer.  Lung cancer. What should I know about heart disease, diabetes, and high blood pressure? Blood pressure and heart disease  High blood pressure causes heart disease and increases the risk of stroke. This is more likely to develop in people who have high blood pressure readings, are of African descent, or are overweight.  Have your blood pressure checked: ? Every 3-5 years if you are 35-75 years of age. ? Every year if you are 49 years old or older. Diabetes Have regular diabetes screenings. This checks your fasting blood sugar level. Have the screening done:  Once every three years after age 43 if you are at a normal weight and have a low risk for diabetes.  More often and at a younger age if you are overweight or have a high risk for diabetes. What should I know about preventing infection? Hepatitis B If you have a higher risk for hepatitis B, you should be screened for this virus. Talk with your health care provider to find out if you are at risk for hepatitis B infection. Hepatitis C Testing is recommended for:  Everyone born from [birth year] through 1967.  Anyone with known risk factors for hepatitis  C. Sexually transmitted infections (STIs)  Get screened for STIs, including gonorrhea and chlamydia, if: ? You are sexually active and are younger than 71 years of age. ? You are older than 71 years of age and your health care provider tells you that you are at risk for this type of infection. ? Your sexual activity has changed since you were last screened, and you are at increased risk for chlamydia  or gonorrhea. Ask your health care provider if you are at risk.  Ask your health care provider about whether you are at high risk for HIV. Your health care provider may recommend a prescription medicine to help prevent HIV infection. If you choose to take medicine to prevent HIV, you should first get tested for HIV. You should then be tested every 3 months for as long as you are taking the medicine. Pregnancy  If you are about to stop having your period (premenopausal) and you may become pregnant, seek counseling before you get pregnant.  Take 400 to 800 micrograms (mcg) of folic acid every day if you become pregnant.  Ask for birth control (contraception) if you want to prevent pregnancy. Osteoporosis and menopause Osteoporosis is a disease in which the bones lose minerals and strength with aging. This can result in bone fractures. If you are 65 years old or older, or if you are at risk for osteoporosis and fractures, ask your health care provider if you should:  Be screened for bone loss.  Take a calcium or vitamin D supplement to lower your risk of fractures.  Be given hormone replacement therapy (HRT) to treat symptoms of menopause. Follow these instructions at home: Lifestyle  Do not use any products that contain nicotine or tobacco, such as cigarettes, e-cigarettes, and chewing tobacco. If you need help quitting, ask your health care provider.  Do not use street drugs.  Do not share needles.  Ask your health care provider for help if you need support or information about quitting drugs. Alcohol use  Do not drink alcohol if: ? Your health care provider tells you not to drink. ? You are pregnant, may be pregnant, or are planning to become pregnant.  If you drink alcohol: ? Limit how much you use to 0-1 drink a day. ? Limit intake if you are breastfeeding.  Be aware of how much alcohol is in your drink. In the [place] one drink equals one 12 oz bottle of beer (355 mL), one 5 oz  glass of wine (148 mL), or one 1 oz glass of hard liquor (44 mL). General instructions  Schedule regular health, dental, and eye exams.  Stay current with your vaccines.  Tell your health care provider if: ? You often feel depressed. ? You have ever been abused or do not feel safe at home. Summary  Adopting a healthy lifestyle and getting preventive care are important in promoting health and wellness.  Follow your health care provider's instructions about healthy diet, exercising, and getting tested or screened for diseases.  Follow your health care provider's instructions on monitoring your cholesterol and blood pressure. This information is not intended to replace advice given to you by your health care provider. Make sure you discuss any questions you have with your health care provider. Document Revised: 01/10/2018 Document Reviewed: 01/10/2018 Elsevier Patient Education  2020 Elsevier Inc.  

## 2019-06-05 ENCOUNTER — Ambulatory Visit (INDEPENDENT_AMBULATORY_CARE_PROVIDER_SITE_OTHER): Payer: Medicare PPO | Admitting: Internal Medicine

## 2019-06-05 ENCOUNTER — Other Ambulatory Visit: Payer: Self-pay

## 2019-06-05 ENCOUNTER — Encounter: Payer: Self-pay | Admitting: Internal Medicine

## 2019-06-05 VITALS — BP 118/70 | HR 72 | Temp 98.7°F | Resp 16 | Ht 64.0 in | Wt 156.0 lb

## 2019-06-05 DIAGNOSIS — R7303 Prediabetes: Secondary | ICD-10-CM

## 2019-06-05 DIAGNOSIS — K219 Gastro-esophageal reflux disease without esophagitis: Secondary | ICD-10-CM

## 2019-06-05 DIAGNOSIS — I10 Essential (primary) hypertension: Secondary | ICD-10-CM

## 2019-06-05 DIAGNOSIS — M85851 Other specified disorders of bone density and structure, right thigh: Secondary | ICD-10-CM

## 2019-06-05 DIAGNOSIS — M85852 Other specified disorders of bone density and structure, left thigh: Secondary | ICD-10-CM

## 2019-06-05 DIAGNOSIS — Z0000 Encounter for general adult medical examination without abnormal findings: Secondary | ICD-10-CM | POA: Diagnosis not present

## 2019-06-05 DIAGNOSIS — Z8542 Personal history of malignant neoplasm of other parts of uterus: Secondary | ICD-10-CM

## 2019-06-05 DIAGNOSIS — G620 Drug-induced polyneuropathy: Secondary | ICD-10-CM

## 2019-06-05 MED ORDER — BENAZEPRIL HCL 40 MG PO TABS: 40.0000 mg | ORAL_TABLET | Freq: Every day | ORAL | 3 refills | Status: DC

## 2019-06-05 MED ORDER — OMEPRAZOLE 20 MG PO CPDR: DELAYED_RELEASE_CAPSULE | ORAL | 3 refills | Status: DC

## 2019-06-05 NOTE — Assessment & Plan Note (Signed)
Following with oncology regularly No evidence of recurrence

## 2019-06-05 NOTE — Assessment & Plan Note (Signed)
Chronic Low sugar / carb diet Stressed regular exercise   

## 2019-06-05 NOTE — Assessment & Plan Note (Addendum)
Chronic BP well controlled Current regimen effective and well tolerated Continue current medications at current doses  

## 2019-06-05 NOTE — Assessment & Plan Note (Signed)
Chronic GERD controlled Continue daily medication Omeprazole 20 mg

## 2019-06-05 NOTE — Assessment & Plan Note (Signed)
Chronic dexa due -ordered for elam Not exercising - stressed regularly walking Taking vitamin d daily, but not calcium - advised taking calcium daily

## 2019-06-12 ENCOUNTER — Telehealth: Payer: Self-pay | Admitting: Internal Medicine

## 2019-06-12 NOTE — Telephone Encounter (Signed)
    Pt c/o medication issue:  1. Name of Medication: omeprazole (PRILOSEC) 20 MG capsule  2. How are you currently taking this medication (dosage and times per day)? As written  3. Are you having a reaction (difficulty breathing--STAT)?no  4. What is your medication issue? Patient  Calling to request a call be made to [facility], unable to fill medication without prior auth

## 2019-06-19 NOTE — Telephone Encounter (Signed)
Pt was able to get the omeprazole

## 2019-07-26 LAB — HM MAMMOGRAPHY

## 2019-08-17 ENCOUNTER — Ambulatory Visit (INDEPENDENT_AMBULATORY_CARE_PROVIDER_SITE_OTHER): Payer: Medicare PPO | Admitting: Family Medicine

## 2019-08-17 ENCOUNTER — Encounter: Payer: Self-pay | Admitting: Family Medicine

## 2019-08-17 ENCOUNTER — Other Ambulatory Visit: Payer: Self-pay

## 2019-08-17 VITALS — BP 124/68 | HR 107 | Temp 97.2°F | Ht 64.0 in | Wt 151.2 lb

## 2019-08-17 DIAGNOSIS — N3001 Acute cystitis with hematuria: Secondary | ICD-10-CM | POA: Diagnosis not present

## 2019-08-17 LAB — POCT URINALYSIS DIPSTICK
Bilirubin, UA: NEGATIVE
Glucose, UA: NEGATIVE
Ketones, UA: NEGATIVE
Nitrite, UA: NEGATIVE
Protein, UA: POSITIVE — AB
Spec Grav, UA: 1.025 (ref 1.010–1.025)
Urobilinogen, UA: 0.2 E.U./dL
pH, UA: 6 (ref 5.0–8.0)

## 2019-08-17 MED ORDER — SULFAMETHOXAZOLE-TRIMETHOPRIM 800-160 MG PO TABS: 1.0000 | ORAL_TABLET | Freq: Two times a day (BID) | ORAL | 0 refills | Status: DC

## 2019-08-17 MED ORDER — PHENAZOPYRIDINE HCL 100 MG PO TABS
100.0000 mg | ORAL_TABLET | Freq: Three times a day (TID) | ORAL | 0 refills | Status: DC | PRN
Start: 2019-08-17 — End: 2020-06-05

## 2019-08-17 NOTE — Patient Instructions (Signed)
Sending in bactrim (antibiotic) for you to take twice a day for 7 days.  No NSAIDs like ibuprofen, aleve, advil. Drinks lots of water Ill let you know if culture shows resistance to bactrim Any fever/chills, worsening pain: ER  Urinary Tract Infection, Adult A urinary tract infection (UTI) is an infection of any part of the urinary tract. The urinary tract includes:  The kidneys.  The ureters.  The bladder.  The urethra. These organs make, store, and get rid of pee (urine) in the body. What are the causes? This is caused by germs (bacteria) in your genital area. These germs grow and cause swelling (inflammation) of your urinary tract. What increases the risk? You are more likely to develop this condition if:  You have a small, thin tube (catheter) to drain pee.  You cannot control when you pee or poop (incontinence).  You are female, and: ? You use these methods to prevent pregnancy:  A medicine that kills sperm (spermicide).  A device that blocks sperm (diaphragm). ? You have low levels of a female hormone (estrogen). ? You are pregnant.  You have genes that add to your risk.  You are sexually active.  You take antibiotic medicines.  You have trouble peeing because of: ? A prostate that is bigger than normal, if you are female. ? A blockage in the part of your body that drains pee from the bladder (urethra). ? A kidney stone. ? A nerve condition that affects your bladder (neurogenic bladder). ? Not getting enough to drink. ? Not peeing often enough.  You have other conditions, such as: ? Diabetes. ? A weak disease-fighting system (immune system). ? Sickle cell disease. ? Gout. ? Injury of the spine. What are the signs or symptoms? Symptoms of this condition include:  Needing to pee right away (urgently).  Peeing often.  Peeing small amounts often.  Pain or burning when peeing.  Blood in the pee.  Pee that smells bad or not like normal.  Trouble  peeing.  Pee that is cloudy.  Fluid coming from the vagina, if you are female.  Pain in the belly or lower back. Other symptoms include:  Throwing up (vomiting).  No urge to eat.  Feeling mixed up (confused).  Being tired and grouchy (irritable).  A fever.  Watery poop (diarrhea). How is this treated? This condition may be treated with:  Antibiotic medicine.  Other medicines.  Drinking enough water. Follow these instructions at home:  Medicines  Take over-the-counter and prescription medicines only as told by your doctor.  If you were prescribed an antibiotic medicine, take it as told by your doctor. Do not stop taking it even if you start to feel better. General instructions  Make sure you: ? Pee until your bladder is empty. ? Do not hold pee for a long time. ? Empty your bladder after sex. ? Wipe from front to back after pooping if you are a female. Use each tissue one time when you wipe.  Drink enough fluid to keep your pee pale yellow.  Keep all follow-up visits as told by your doctor. This is important. Contact a doctor if:  You do not get better after 1-2 days.  Your symptoms go away and then come back. Get help right away if:  You have very bad back pain.  You have very bad pain in your lower belly.  You have a fever.  You are sick to your stomach (nauseous).  You are throwing up. Summary  A urinary tract infection (UTI) is an infection of any part of the urinary tract.  This condition is caused by germs in your genital area.  There are many risk factors for a UTI. These include having a small, thin tube to drain pee and not being able to control when you pee or poop.  Treatment includes antibiotic medicines for germs.  Drink enough fluid to keep your pee pale yellow. This information is not intended to replace advice given to you by your health care provider. Make sure you discuss any questions you have with your health care  provider. Document Revised: 01/04/2018 Document Reviewed: 07/27/2017 Elsevier Patient Education  2020 [employer].

## 2019-08-17 NOTE — Progress Notes (Signed)
Patient: Kendra Leonard MRN: [id number] DOB: [birth year] PCP: Binnie Rail, MD     Subjective:  Chief Complaint  Patient presents with  . Urinary Tract Infection    HPI: The patient is a 69 y.o. female who presents today for possible uti. She says that she noticed last night. She complains of burning while urinating as well as blood when she wipes after she urinates. She has pain in her bladder area. She has no odor or color change to her urine. She has dysuria and feels like she has to go and feels like it doesn't all come out. She has increased frequency, no urgency. She denies fever/chills. No back pain or history of kidney stones. She has had frequent UTIs in the past and seen by urology, but has not had any UTIs in years.   Review of Systems  Constitutional: Positive for chills. Negative for fever.  Genitourinary: Positive for frequency, pelvic pain and urgency.  Musculoskeletal: Negative for back pain.  Neurological: Negative for dizziness, light-headedness and headaches.    Allergies Patient has No Known Allergies.  Past Medical History Patient  has a past medical history of Colon polyp (2008), DJD (degenerative joint disease) of knee, GERD (gastroesophageal reflux disease) (2008), and Hypertension.  Surgical History Patient  has a past surgical history that includes laparoscopy; G 2  P 2; Cystoscopy; Upper gi endoscopy (2008); intraarticular steroid injection (Right, 2013 & 2014); and Wisdom tooth extraction.  Family History Pateint's family history includes Aneurysm in her father; Dementia in her father; Diabetes in her maternal aunt and maternal uncle; Dysrhythmia in her father; GER disease in her brother and father; Hypertension in her brother, father, and mother; Other in her mother; Parkinsonism in her father; Prostate cancer (age of onset: 59) in her father; Stroke in her maternal grandfather; Tremor in her maternal aunt; Uterine cancer (age of onset: 43) in her  mother.  Social History Patient  reports that she has never smoked. She has never used smokeless tobacco. She reports current alcohol use. She reports that she does not use drugs.    Objective: Vitals:   08/17/19 1013  BP: 124/68  Pulse: (!) 107  Temp: (!) 97.2 F (36.2 C)  TempSrc: Temporal  SpO2: 96%  Weight: 151 lb 3.2 oz (68.6 kg)  Height: 5\' 4"  (1.626 m)    Body mass index is 25.95 kg/m.  Physical Exam Vitals reviewed.  Constitutional:      Appearance: Normal appearance. She is normal weight.  HENT:     Head: Normocephalic and atraumatic.  Cardiovascular:     Rate and Rhythm: Normal rate and regular rhythm.     Heart sounds: Normal heart sounds.  Pulmonary:     Effort: Pulmonary effort is normal.     Breath sounds: Normal breath sounds.  Abdominal:     General: Abdomen is flat. Bowel sounds are normal.     Palpations: Abdomen is soft.     Tenderness: There is no abdominal tenderness. There is no right CVA tenderness or left CVA tenderness.  Skin:    General: Skin is warm.     Capillary Refill: Capillary refill takes less than 2 seconds.  Neurological:     General: No focal deficit present.     Mental Status: She is alert and oriented to person, place, and time.  Psychiatric:        Mood and Affect: Mood normal.        Behavior: Behavior normal.    UA: +  blood, 3+leuk, negative nitrite      Assessment/plan: 1. Acute cystitis with hematuria 7 day course of bactrim since + gross hematuria. Renal function wnl. Advised water avoid NSAID while on bactrim. F/u on culture. Printed script for pyridium, but she doesn't think she will need this. If culture negative, will need repeat ua for hematuria.  Precautions given.  - POCT urinalysis dipstick - Urine Culture   This visit occurred during the SARS-CoV-2 public health emergency.  Safety protocols were in place, including screening questions prior to the visit, additional usage of staff PPE, and extensive cleaning  of exam room while observing appropriate contact time as indicated for disinfecting solutions.      Return if symptoms worsen or fail to improve.   Orma Flaming, MD [place]   08/17/2019

## 2019-08-19 DIAGNOSIS — N3001 Acute cystitis with hematuria: Secondary | ICD-10-CM | POA: Diagnosis not present

## 2019-08-19 NOTE — Addendum Note (Signed)
Addended by: Doran Clay A on: 08/19/2019 08:15 AM   Modules accepted: Orders

## 2019-08-21 LAB — URINE CULTURE
MICRO NUMBER:: 10721345
SPECIMEN QUALITY:: ADEQUATE

## 2019-08-23 ENCOUNTER — Ambulatory Visit (INDEPENDENT_AMBULATORY_CARE_PROVIDER_SITE_OTHER): Payer: Medicare PPO | Admitting: Family

## 2019-08-23 ENCOUNTER — Other Ambulatory Visit: Payer: Medicare PPO

## 2019-08-23 ENCOUNTER — Other Ambulatory Visit: Payer: Self-pay

## 2019-08-23 ENCOUNTER — Encounter: Payer: Self-pay | Admitting: Family

## 2019-08-23 VITALS — BP 106/78 | HR 74 | Ht 64.0 in | Wt 147.0 lb

## 2019-08-23 DIAGNOSIS — R319 Hematuria, unspecified: Secondary | ICD-10-CM | POA: Diagnosis not present

## 2019-08-23 DIAGNOSIS — R42 Dizziness and giddiness: Secondary | ICD-10-CM | POA: Diagnosis not present

## 2019-08-23 LAB — POC URINALSYSI DIPSTICK (AUTOMATED)
Bilirubin, UA: POSITIVE
Blood, UA: POSITIVE
Glucose, UA: NEGATIVE
Ketones, UA: POSITIVE
Nitrite, UA: NEGATIVE
Protein, UA: POSITIVE — AB
Spec Grav, UA: 1.025 (ref 1.010–1.025)
Urobilinogen, UA: NEGATIVE E.U./dL — AB
pH, UA: 5.5 (ref 5.0–8.0)

## 2019-08-23 LAB — URINALYSIS, ROUTINE W REFLEX MICROSCOPIC
Bilirubin Urine: NEGATIVE
Ketones, ur: NEGATIVE
Nitrite: NEGATIVE
Specific Gravity, Urine: 1.015 (ref 1.000–1.030)
Total Protein, Urine: NEGATIVE
Urine Glucose: NEGATIVE
Urobilinogen, UA: 0.2 (ref 0.0–1.0)
pH: 5.5 (ref 5.0–8.0)

## 2019-08-23 MED ORDER — CIPROFLOXACIN HCL 250 MG PO TABS
250.0000 mg | ORAL_TABLET | Freq: Two times a day (BID) | ORAL | 0 refills | Status: DC
Start: 2019-08-23 — End: 2020-02-14

## 2019-08-23 NOTE — Addendum Note (Signed)
Addended by: Jacob Moores on: 08/23/2019 03:18 PM   Modules accepted: Orders

## 2019-08-23 NOTE — Progress Notes (Signed)
Kendra Leonard is a 69 y.o. 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   Right shoulder pain 06/04/2018   Snoring 06/04/2018   History of endometrial cancer, 2019, adenocarcinoma 11/08/2017   Osteopenia 06/07/2016   Prediabetes 06/07/2016   Irritable bowel syndrome (IBS) 11/03/2014   Hematuria, microscopic 04/27/2011   DIVERTICULITIS, HX OF 05/28/2009   DEGENERATIVE JOINT DISEASE, KNEE 04/22/2008   POPLITEAL CYST, RIGHT 04/22/2008   COLONIC POLYPS, HX OF 01/28/2008   Essential hypertension 01/26/2007   EROSIVE ESOPHAGITIS 12/22/2006   DIVERTICULOSIS, COLON 12/22/2006   GERD 10/24/2006   COMMON MIGRAINE 06/27/2006    Current Outpatient Medications  Medication Sig Dispense Refill   benazepril (LOTENSIN) 40 MG tablet Take 1 tablet (40 mg total) by mouth daily. 90 tablet 3   Calcium Carbonate-Vitamin D (CALCIUM + D PO) Take by mouth daily.     Cholecalciferol (VITAMIN D-3) 1000 UNITS CAPS Take by mouth daily.     gabapentin (NEURONTIN) 300 MG capsule Take by mouth.     Omega-3 Fatty Acids (FISH OIL) 1200 MG CAPS Take by mouth daily.     omeprazole (PRILOSEC) 20 MG capsule TAKE 1 CAPSULE 30 MINUTES  BEFORE BREAKFAST *SANDOZ   MFR* 90 capsule 3   phenazopyridine (PYRIDIUM) 100 MG tablet Take 1 tablet (100 mg total) by mouth 3 (three) times daily as needed for pain. 10 tablet 0   sulfamethoxazole-trimethoprim (BACTRIM DS) 800-160 MG tablet Take 1 tablet by mouth 2 (two) times daily. 14 tablet 0   Turmeric 500 MG CAPS Take by mouth.     ciprofloxacin (CIPRO) 250 MG tablet Take 1 tablet (250 mg total) by mouth 2 (two) times daily. 6 tablet 0   No current facility-administered medications for this visit.    Allergies: Patient has no known allergies.  Past Medical History:  Diagnosis Date   Colon polyp 2008   3 mm   DJD  (degenerative joint disease) of knee    Dr Maureen Ralphs   GERD (gastroesophageal reflux disease) 2008   Dr Carlean Purl   Hypertension     Past Surgical History:  Procedure Laterality Date   CYSTOSCOPY     X4 for microscopic hematuria   G 2  P 2     intraarticular steroid injection Right 2013 & 2014   Dr Maureen Ralphs   LAPAROSCOPY     For Infertility work up   UPPER GI ENDOSCOPY  2008   Dr Carlean Purl   WISDOM TOOTH EXTRACTION      Family History  Problem Relation Age of Onset   Prostate cancer Father 47   Hypertension Father    Aneurysm Father        Thoracic Aneurysm   Dysrhythmia Father    GER disease Father        PMH DUD   Dementia Father    Parkinsonism Father    Uterine cancer Mother 42   Hypertension Mother    Other Mother        colovaginal fistula   Hypertension Brother    Diabetes Maternal Aunt    Diabetes Maternal Uncle    Tremor Maternal Aunt        2 aunts ( no FH Parkinson's)   Stroke Maternal Grandfather        >55   GER disease Brother    Colon cancer Neg Hx    Colon polyps  Neg Hx    Rectal cancer Neg Hx    Stomach cancer Neg Hx     Social History   Tobacco Use   Smoking status: Never Smoker   Smokeless tobacco: Never Used  Substance Use Topics   Alcohol use: Yes    Comment: Socially     Subjective:  Patient [facility] with UTI; urine culture did show E. Coli infection; she was treated appropriately with 7 day course of Bactrim; Requesting to have her urine re-checked today to ensure complete resolution; notes that she still does not feel 100% back to normal;  Is concerned that she is having a reaction to the Sulfa and that maybe this is not the best medication for her; would like to discuss changing her antibiotic with the upcoming weekend;   Objective:  Vitals:   08/23/19 1323  BP: 106/78  Pulse: 74  SpO2: 96%  Weight: 147 lb (66.7 kg)  Height: 5\' 4"  (1.626 m)    General: Well developed, well  nourished, in no acute distress  Skin : Warm and dry.  Head: Normocephalic and atraumatic  Eyes: Sclera and conjunctiva clear; pupils round and reactive to light; extraocular movements intact  Lungs: Respirations unlabored; clear to auscultation bilaterally without wheeze, rales, rhonchi  CVS exam: normal rate and regular rhythm.  Neurologic: Alert and oriented; speech intact; face symmetrical; moves all extremities well; CNII-XII intact without focal deficit   Assessment:  1. Hematuria, unspecified type   2. Dizziness     Plan:  U/A done in office does show leuks and blood; no nitrites noted; patient is understandably concerned that the Bactrim is either not working or is causing her side effects; will repeat urine culture and U/A with microscopy; may also need to consider imaging if symptoms persist.   D/C Bactrim and change to Cipro which patient requests and notes she has done well on in the past; ? If antibiotic is causing symptoms; Physical exam is reassuring;  Follow up to be determined;  This visit occurred during the SARS-CoV-2 public health emergency.  Safety protocols were in place, including screening questions prior to the visit, additional usage of staff PPE, and extensive cleaning of exam room while observing appropriate contact time as indicated for disinfecting solutions.     No follow-ups on file.  Orders Placed This Encounter  Procedures   Urine Culture    Standing Status:   Future    Standing Expiration Date:   08/22/2020   Urine Culture    Standing Status:   Future    Standing Expiration Date:   08/22/2020   Urinalysis, Routine w reflex microscopic    Standing Status:   Future    Standing Expiration Date:   08/22/2020   Urinalysis, Routine w reflex microscopic    Standing Status:   Future    Standing Expiration Date:   08/22/2020   POCT Urinalysis Dipstick (Automated)    Requested Prescriptions   Signed Prescriptions Disp Refills   ciprofloxacin  (CIPRO) 250 MG tablet 6 tablet 0    Sig: Take 1 tablet (250 mg total) by mouth 2 (two) times daily.

## 2019-08-24 LAB — URINE CULTURE
MICRO NUMBER:: 10742677
Result:: NO GROWTH
SPECIMEN QUALITY:: ADEQUATE

## 2019-09-09 DIAGNOSIS — C541 Malignant neoplasm of endometrium: Secondary | ICD-10-CM | POA: Diagnosis not present

## 2019-09-09 DIAGNOSIS — R300 Dysuria: Secondary | ICD-10-CM | POA: Diagnosis not present

## 2019-09-09 DIAGNOSIS — M7989 Other specified soft tissue disorders: Secondary | ICD-10-CM | POA: Diagnosis not present

## 2019-09-17 DIAGNOSIS — C541 Malignant neoplasm of endometrium: Secondary | ICD-10-CM | POA: Diagnosis not present

## 2019-10-28 ENCOUNTER — Ambulatory Visit (INDEPENDENT_AMBULATORY_CARE_PROVIDER_SITE_OTHER): Payer: Medicare PPO

## 2019-10-28 ENCOUNTER — Other Ambulatory Visit: Payer: Self-pay

## 2019-10-28 DIAGNOSIS — Z23 Encounter for immunization: Secondary | ICD-10-CM | POA: Diagnosis not present

## 2019-10-28 NOTE — Progress Notes (Signed)
Pt came in today to receive her high dose flu shot, pt received her flu shot in her right deltoid, Pt tolerated injection well and was instructed to stay in office for atleast 15 min.

## 2019-11-04 DIAGNOSIS — M1712 Unilateral primary osteoarthritis, left knee: Secondary | ICD-10-CM | POA: Diagnosis not present

## 2019-11-04 DIAGNOSIS — M25561 Pain in right knee: Secondary | ICD-10-CM | POA: Diagnosis not present

## 2019-11-04 DIAGNOSIS — M1711 Unilateral primary osteoarthritis, right knee: Secondary | ICD-10-CM | POA: Diagnosis not present

## 2019-11-04 DIAGNOSIS — M170 Bilateral primary osteoarthritis of knee: Secondary | ICD-10-CM | POA: Diagnosis not present

## 2019-12-04 DIAGNOSIS — H02403 Unspecified ptosis of bilateral eyelids: Secondary | ICD-10-CM | POA: Diagnosis not present

## 2019-12-04 DIAGNOSIS — H5213 Myopia, bilateral: Secondary | ICD-10-CM | POA: Diagnosis not present

## 2019-12-04 DIAGNOSIS — Z961 Presence of intraocular lens: Secondary | ICD-10-CM | POA: Diagnosis not present

## 2019-12-09 DIAGNOSIS — C541 Malignant neoplasm of endometrium: Secondary | ICD-10-CM | POA: Diagnosis not present

## 2019-12-09 DIAGNOSIS — Z8542 Personal history of malignant neoplasm of other parts of uterus: Secondary | ICD-10-CM | POA: Diagnosis not present

## 2019-12-09 DIAGNOSIS — Z08 Encounter for follow-up examination after completed treatment for malignant neoplasm: Secondary | ICD-10-CM | POA: Diagnosis not present

## 2019-12-11 DIAGNOSIS — M1711 Unilateral primary osteoarthritis, right knee: Secondary | ICD-10-CM | POA: Diagnosis not present

## 2019-12-18 DIAGNOSIS — M1711 Unilateral primary osteoarthritis, right knee: Secondary | ICD-10-CM | POA: Diagnosis not present

## 2019-12-25 DIAGNOSIS — M1711 Unilateral primary osteoarthritis, right knee: Secondary | ICD-10-CM | POA: Diagnosis not present

## 2020-01-12 DIAGNOSIS — Z20822 Contact with and (suspected) exposure to covid-19: Secondary | ICD-10-CM | POA: Diagnosis not present

## 2020-01-17 DIAGNOSIS — H02832 Dermatochalasis of right lower eyelid: Secondary | ICD-10-CM | POA: Diagnosis not present

## 2020-01-17 DIAGNOSIS — H02831 Dermatochalasis of right upper eyelid: Secondary | ICD-10-CM | POA: Diagnosis not present

## 2020-01-17 DIAGNOSIS — H57813 Brow ptosis, bilateral: Secondary | ICD-10-CM | POA: Diagnosis not present

## 2020-01-17 DIAGNOSIS — H02413 Mechanical ptosis of bilateral eyelids: Secondary | ICD-10-CM | POA: Diagnosis not present

## 2020-01-17 DIAGNOSIS — H53483 Generalized contraction of visual field, bilateral: Secondary | ICD-10-CM | POA: Diagnosis not present

## 2020-01-17 DIAGNOSIS — H02423 Myogenic ptosis of bilateral eyelids: Secondary | ICD-10-CM | POA: Diagnosis not present

## 2020-01-17 DIAGNOSIS — H0279 Other degenerative disorders of eyelid and periocular area: Secondary | ICD-10-CM | POA: Diagnosis not present

## 2020-01-17 DIAGNOSIS — H02834 Dermatochalasis of left upper eyelid: Secondary | ICD-10-CM | POA: Diagnosis not present

## 2020-01-17 DIAGNOSIS — H02835 Dermatochalasis of left lower eyelid: Secondary | ICD-10-CM | POA: Diagnosis not present

## 2020-02-01 HISTORY — PX: EYE SURGERY: SHX253

## 2020-02-06 DIAGNOSIS — M1711 Unilateral primary osteoarthritis, right knee: Secondary | ICD-10-CM | POA: Diagnosis not present

## 2020-02-11 ENCOUNTER — Telehealth: Payer: Self-pay | Admitting: Internal Medicine

## 2020-02-11 ENCOUNTER — Other Ambulatory Visit: Payer: Self-pay

## 2020-02-11 DIAGNOSIS — R300 Dysuria: Secondary | ICD-10-CM

## 2020-02-11 DIAGNOSIS — M25561 Pain in right knee: Secondary | ICD-10-CM | POA: Diagnosis not present

## 2020-02-11 NOTE — Telephone Encounter (Signed)
   Patient requesting order for urine test. She states she is pretty sure she has a UTI

## 2020-02-11 NOTE — Telephone Encounter (Signed)
Spoke with patient and appointment made

## 2020-02-11 NOTE — Telephone Encounter (Addendum)
Ok - in the future should be seen - we get the results quicker.  ordered for [facility]

## 2020-02-12 ENCOUNTER — Other Ambulatory Visit (INDEPENDENT_AMBULATORY_CARE_PROVIDER_SITE_OTHER): Payer: Medicare HMO

## 2020-02-12 ENCOUNTER — Other Ambulatory Visit: Payer: Self-pay

## 2020-02-12 DIAGNOSIS — R300 Dysuria: Secondary | ICD-10-CM | POA: Diagnosis not present

## 2020-02-12 NOTE — Addendum Note (Signed)
Addended by: Raliegh Ip on: 02/12/2020 02:31 PM   Modules accepted: Orders

## 2020-02-13 ENCOUNTER — Telehealth: Payer: Self-pay | Admitting: Internal Medicine

## 2020-02-13 ENCOUNTER — Other Ambulatory Visit: Payer: Medicare PPO

## 2020-02-13 DIAGNOSIS — M25561 Pain in right knee: Secondary | ICD-10-CM | POA: Diagnosis not present

## 2020-02-13 LAB — URINALYSIS, ROUTINE W REFLEX MICROSCOPIC
Bilirubin Urine: NEGATIVE
Ketones, ur: NEGATIVE
Leukocytes,Ua: NEGATIVE
Nitrite: NEGATIVE
Specific Gravity, Urine: 1.015 (ref 1.000–1.030)
Total Protein, Urine: NEGATIVE
Urine Glucose: NEGATIVE
Urobilinogen, UA: 0.2 (ref 0.0–1.0)
pH: 5.5 (ref 5.0–8.0)

## 2020-02-13 NOTE — Telephone Encounter (Signed)
   Patient calling for urine test results.   Pharmacy [id number] - [place], [place]

## 2020-02-14 LAB — URINE CULTURE

## 2020-02-14 MED ORDER — CEPHALEXIN 500 MG PO CAPS
500.0000 mg | ORAL_CAPSULE | Freq: Two times a day (BID) | ORAL | 0 refills | Status: DC
Start: 2020-02-14 — End: 2020-06-04

## 2020-02-14 NOTE — Telephone Encounter (Signed)
Urine culture still pending.

## 2020-02-19 DIAGNOSIS — M25561 Pain in right knee: Secondary | ICD-10-CM | POA: Diagnosis not present

## 2020-02-19 NOTE — Telephone Encounter (Signed)
Spoke with patient and results given. 

## 2020-02-19 NOTE — Telephone Encounter (Signed)
Urine culture shows an infection - an antibiotic was sent to your pharmacy.

## 2020-02-21 DIAGNOSIS — M25561 Pain in right knee: Secondary | ICD-10-CM | POA: Diagnosis not present

## 2020-02-25 DIAGNOSIS — M25561 Pain in right knee: Secondary | ICD-10-CM | POA: Diagnosis not present

## 2020-02-27 DIAGNOSIS — M25561 Pain in right knee: Secondary | ICD-10-CM | POA: Diagnosis not present

## 2020-03-03 DIAGNOSIS — M25561 Pain in right knee: Secondary | ICD-10-CM | POA: Diagnosis not present

## 2020-03-05 DIAGNOSIS — M25561 Pain in right knee: Secondary | ICD-10-CM | POA: Diagnosis not present

## 2020-03-09 DIAGNOSIS — C541 Malignant neoplasm of endometrium: Secondary | ICD-10-CM | POA: Diagnosis not present

## 2020-03-09 DIAGNOSIS — R300 Dysuria: Secondary | ICD-10-CM | POA: Diagnosis not present

## 2020-03-09 DIAGNOSIS — Z90710 Acquired absence of both cervix and uterus: Secondary | ICD-10-CM | POA: Diagnosis not present

## 2020-03-10 DIAGNOSIS — M25561 Pain in right knee: Secondary | ICD-10-CM | POA: Diagnosis not present

## 2020-03-12 DIAGNOSIS — M25561 Pain in right knee: Secondary | ICD-10-CM | POA: Diagnosis not present

## 2020-03-13 DIAGNOSIS — Z9079 Acquired absence of other genital organ(s): Secondary | ICD-10-CM | POA: Diagnosis not present

## 2020-03-13 DIAGNOSIS — Z90722 Acquired absence of ovaries, bilateral: Secondary | ICD-10-CM | POA: Diagnosis not present

## 2020-03-13 DIAGNOSIS — Z08 Encounter for follow-up examination after completed treatment for malignant neoplasm: Secondary | ICD-10-CM | POA: Diagnosis not present

## 2020-03-13 DIAGNOSIS — Z8542 Personal history of malignant neoplasm of other parts of uterus: Secondary | ICD-10-CM | POA: Diagnosis not present

## 2020-03-13 DIAGNOSIS — Z90710 Acquired absence of both cervix and uterus: Secondary | ICD-10-CM | POA: Diagnosis not present

## 2020-03-13 DIAGNOSIS — Z923 Personal history of irradiation: Secondary | ICD-10-CM | POA: Diagnosis not present

## 2020-03-13 DIAGNOSIS — C541 Malignant neoplasm of endometrium: Secondary | ICD-10-CM | POA: Diagnosis not present

## 2020-03-13 DIAGNOSIS — Z9889 Other specified postprocedural states: Secondary | ICD-10-CM | POA: Diagnosis not present

## 2020-03-13 DIAGNOSIS — N2889 Other specified disorders of kidney and ureter: Secondary | ICD-10-CM | POA: Diagnosis not present

## 2020-03-17 DIAGNOSIS — M25561 Pain in right knee: Secondary | ICD-10-CM | POA: Diagnosis not present

## 2020-03-19 DIAGNOSIS — M25561 Pain in right knee: Secondary | ICD-10-CM | POA: Diagnosis not present

## 2020-03-20 DIAGNOSIS — M1711 Unilateral primary osteoarthritis, right knee: Secondary | ICD-10-CM | POA: Diagnosis not present

## 2020-03-31 ENCOUNTER — Encounter: Payer: Self-pay | Admitting: Internal Medicine

## 2020-04-01 ENCOUNTER — Other Ambulatory Visit: Payer: Self-pay

## 2020-04-01 MED ORDER — OMEPRAZOLE 20 MG PO CPDR: DELAYED_RELEASE_CAPSULE | ORAL | 3 refills | Status: DC

## 2020-04-01 MED ORDER — BENAZEPRIL HCL 40 MG PO TABS: 40.0000 mg | ORAL_TABLET | Freq: Every day | ORAL | 3 refills | Status: DC

## 2020-04-14 DIAGNOSIS — N3946 Mixed incontinence: Secondary | ICD-10-CM | POA: Diagnosis not present

## 2020-04-14 DIAGNOSIS — R311 Benign essential microscopic hematuria: Secondary | ICD-10-CM | POA: Diagnosis not present

## 2020-05-04 ENCOUNTER — Telehealth: Payer: Self-pay | Admitting: Internal Medicine

## 2020-05-04 NOTE — Telephone Encounter (Signed)
Pt called stating that she had called last week requesting to speak with Dr. Carlean Purl nurse because she wants to schedule a virtual colon so she was calling to f/u as nobody had returned her call. Nothing was documented about that call and since pt had transferred her care to [facility], I explained to her the protocol if she wants to transfer back. Pt was a bit disappointed as the person that she spoke with did not mention anything about that. I apologized to her. She stated that she was going to think about it and call us back.

## 2020-05-13 DIAGNOSIS — Z6827 Body mass index (BMI) 27.0-27.9, adult: Secondary | ICD-10-CM | POA: Diagnosis not present

## 2020-05-13 DIAGNOSIS — Z124 Encounter for screening for malignant neoplasm of cervix: Secondary | ICD-10-CM | POA: Diagnosis not present

## 2020-05-13 DIAGNOSIS — Z8542 Personal history of malignant neoplasm of other parts of uterus: Secondary | ICD-10-CM | POA: Diagnosis not present

## 2020-05-20 DIAGNOSIS — Z801 Family history of malignant neoplasm of trachea, bronchus and lung: Secondary | ICD-10-CM | POA: Diagnosis not present

## 2020-05-20 DIAGNOSIS — Z806 Family history of leukemia: Secondary | ICD-10-CM | POA: Diagnosis not present

## 2020-05-20 DIAGNOSIS — Z8041 Family history of malignant neoplasm of ovary: Secondary | ICD-10-CM | POA: Diagnosis not present

## 2020-05-20 DIAGNOSIS — M8588 Other specified disorders of bone density and structure, other site: Secondary | ICD-10-CM | POA: Diagnosis not present

## 2020-05-20 DIAGNOSIS — Z8042 Family history of malignant neoplasm of prostate: Secondary | ICD-10-CM | POA: Diagnosis not present

## 2020-05-20 DIAGNOSIS — N958 Other specified menopausal and perimenopausal disorders: Secondary | ICD-10-CM | POA: Diagnosis not present

## 2020-05-20 DIAGNOSIS — Z800 Family history of malignant neoplasm of digestive organs: Secondary | ICD-10-CM | POA: Diagnosis not present

## 2020-05-20 DIAGNOSIS — Z8049 Family history of malignant neoplasm of other genital organs: Secondary | ICD-10-CM | POA: Diagnosis not present

## 2020-05-20 DIAGNOSIS — Z8542 Personal history of malignant neoplasm of other parts of uterus: Secondary | ICD-10-CM | POA: Diagnosis not present

## 2020-05-20 LAB — HM DEXA SCAN

## 2020-05-21 ENCOUNTER — Encounter: Payer: Self-pay | Admitting: Internal Medicine

## 2020-05-21 NOTE — Progress Notes (Signed)
Outside notes received. Information abstracted. Notes sent to scan.  

## 2020-05-28 ENCOUNTER — Encounter: Payer: Self-pay | Admitting: Internal Medicine

## 2020-05-28 NOTE — Progress Notes (Signed)
Outside notes received. Information abstracted. Notes sent to scan.  

## 2020-06-04 ENCOUNTER — Ambulatory Visit: Payer: Medicare HMO

## 2020-06-04 DIAGNOSIS — R311 Benign essential microscopic hematuria: Secondary | ICD-10-CM | POA: Diagnosis not present

## 2020-06-04 DIAGNOSIS — N3946 Mixed incontinence: Secondary | ICD-10-CM | POA: Diagnosis not present

## 2020-06-04 NOTE — Progress Notes (Signed)
Subjective:    Patient ID: Kendra Leonard, female    DOB: [birth year], 70 y.o.   MRN: [id number]   This visit occurred during the SARS-CoV-2 public health emergency.  Safety protocols were in place, including screening questions prior to the visit, additional usage of staff PPE, and extensive cleaning of exam room while observing appropriate contact time as indicated for disinfecting solutions.    HPI She is here for a physical exam.   Oct - was walking - flared R knee OA and has had swelling and pain.  Has done PT. Did gel injections - did not nothing.  Cortisone did help once but not the second time.  She is frustrated by the pain.  She is seeing dr Wynelle Link.       Medications and allergies reviewed with patient and updated if appropriate.  Patient Active Problem List   Diagnosis Date Noted  . Osteoarthritis of right knee 06/05/2020  . Neuropathy due to chemotherapeutic drug ([place]) 06/04/2018  . Right shoulder pain 06/04/2018  . Snoring 06/04/2018  . History of endometrial cancer, 2019, adenocarcinoma 11/08/2017  . Osteopenia 06/07/2016  . Prediabetes 06/07/2016  . Irritable bowel syndrome (IBS) 11/03/2014  . Hematuria, microscopic 04/27/2011  . DIVERTICULITIS, HX OF 05/28/2009  . POPLITEAL CYST, RIGHT 04/22/2008  . COLONIC POLYPS, HX OF 01/28/2008  . Essential hypertension 01/26/2007  . EROSIVE ESOPHAGITIS 12/22/2006  . DIVERTICULOSIS, COLON 12/22/2006  . GERD 10/24/2006  . COMMON MIGRAINE 06/27/2006    Current Outpatient Medications on File Prior to Visit  Medication Sig Dispense Refill  . benazepril (LOTENSIN) 40 MG tablet Take 1 tablet (40 mg total) by mouth daily. 90 tablet 3  . Calcium Carbonate-Vitamin D (CALCIUM + D PO) Take by mouth daily.    . Cholecalciferol (VITAMIN D-3) 1000 UNITS CAPS Take by mouth daily.    Marland Kitchen gabapentin (NEURONTIN) 300 MG capsule Take by mouth.    . Omega-3 Fatty Acids (FISH OIL) 1200 MG CAPS Take by mouth daily.    Marland Kitchen omeprazole  (PRILOSEC) 20 MG capsule TAKE 1 CAPSULE 30 MINUTES  BEFORE BREAKFAST *SANDOZ   MFR* 90 capsule 3   No current facility-administered medications on file prior to visit.    Past Medical History:  Diagnosis Date  . Colon polyp 2008   3 mm  . DJD (degenerative joint disease) of knee    Dr Maureen Ralphs  . GERD (gastroesophageal reflux disease) 2008   Dr Carlean Purl  . Hypertension     Past Surgical History:  Procedure Laterality Date  . CYSTOSCOPY     X4 for microscopic hematuria  . G 2  P 2    . intraarticular steroid injection Right 2013 & 2014   Dr Maureen Ralphs  . LAPAROSCOPY     For Infertility work up  . UPPER GI ENDOSCOPY  2008   Dr Carlean Purl  . WISDOM TOOTH EXTRACTION      Social History   Socioeconomic History  . Marital status: Married    Spouse name: Not on file  . Number of children: Not on file  . Years of education: Not on file  . Highest education level: Not on file  Occupational History  . Not on file  Tobacco Use  . Smoking status: Never Smoker  . Smokeless tobacco: Never Used  Vaping Use  . Vaping Use: Never used  Substance and Sexual Activity  . Alcohol use: Yes    Comment: Socially   . Drug use: No  . Sexual  activity: Not on file  Other Topics Concern  . Not on file  Social History Narrative   Walking regularly for exercise   Social Determinants of Health   Financial Resource Strain: Not on file  Food Insecurity: Not on file  Transportation Needs: Not on file  Physical Activity: Not on file  Stress: Not on file  Social Connections: Not on file    Family History  Problem Relation Age of Onset  . Prostate cancer Father 45  . Hypertension Father   . Aneurysm Father        Thoracic Aneurysm  . Dysrhythmia Father   . GER disease Father        PMH DUD  . Dementia Father   . Parkinsonism Father   . Uterine cancer Mother 54  . Hypertension Mother   . Other Mother        colovaginal fistula  . Hypertension Brother   . Diabetes Maternal Aunt   .  Diabetes Maternal Uncle   . Tremor Maternal Aunt        2 aunts ( no FH Parkinson's)  . Stroke Maternal Grandfather        >55  . GER disease Brother   . Colon cancer Neg Hx   . Colon polyps Neg Hx   . Rectal cancer Neg Hx   . Stomach cancer Neg Hx     Review of Systems  Constitutional: Negative for chills and fever.  Eyes: Negative for visual disturbance.  Respiratory: Negative for cough, shortness of breath and wheezing.   Cardiovascular: Negative for chest pain, palpitations and leg swelling.  Gastrointestinal: Negative for abdominal pain, blood in stool, constipation, diarrhea and nausea.       No gerd  Genitourinary: Negative for dysuria.  Musculoskeletal: Positive for arthralgias (r knee).  Skin: Negative for rash.  Neurological: Negative for light-headedness and headaches.  Psychiatric/Behavioral: Negative for dysphoric mood. The patient is not nervous/anxious.        Objective:   Vitals:   06/05/20 1113  BP: 130/72  Pulse: 80  Temp: 98.2 F (36.8 C)  SpO2: 96%   Filed Weights   06/05/20 1113  Weight: 150 lb (68 kg)   Body mass index is 25.75 kg/m.  BP Readings from Last 3 Encounters:  06/05/20 130/72  08/23/19 106/78  08/17/19 124/68    Wt Readings from Last 3 Encounters:  06/05/20 150 lb (68 kg)  08/23/19 147 lb (66.7 kg)  08/17/19 151 lb 3.2 oz (68.6 kg)     Physical Exam Constitutional: She appears well-developed and well-nourished. No distress.  HENT:  Head: Normocephalic and atraumatic.  Right Ear: External ear normal. Normal ear canal and TM Left Ear: External ear normal.  Normal ear canal and TM Mouth/Throat: Oropharynx is clear and moist.  Eyes: Conjunctivae and EOM are normal.  Neck: Neck supple. No tracheal deviation present. No thyromegaly present.  No carotid bruit  Cardiovascular: Normal rate, regular rhythm and normal heart sounds.   No murmur heard.  No edema. Pulmonary/Chest: Effort normal and breath sounds normal. No  respiratory distress. She has no wheezes. She has no rales.  Breast: deferred   Abdominal: Soft. She exhibits no distension. There is no tenderness.  Lymphadenopathy: She has no cervical adenopathy.  Skin: Skin is warm and dry. She is not diaphoretic.  Psychiatric: She has a normal mood and affect. Her behavior is normal.        Assessment & Plan:   Physical exam: Screening blood  work    ordered Immunizations  Discussed tdap, covid boosters, shingrix Colonoscopy  Up to date  Mammogram  Up to date  Gyn  n/a Dexa  Up to date  Eye exams  Up to date  Exercise  Riding bike-stressed the importance of regular exercise Weight  normal Substance abuse  none      See Problem List for Assessment and Plan of chronic medical problems.

## 2020-06-04 NOTE — Patient Instructions (Addendum)
Blood work was ordered.     Medications changes include :   none     Please followup in 1 year    Health Maintenance, Female Adopting a healthy lifestyle and getting preventive care are important in promoting health and wellness. Ask your health care provider about:  The right schedule for you to have regular tests and exams.  Things you can do on your own to prevent diseases and keep yourself healthy. What should I know about diet, weight, and exercise? Eat a healthy diet  Eat a diet that includes plenty of vegetables, fruits, low-fat dairy products, and lean protein.  Do not eat a lot of foods that are high in solid fats, added sugars, or sodium.   Maintain a healthy weight Body mass index (BMI) is used to identify weight problems. It estimates body fat based on height and weight. Your health care provider can help determine your BMI and help you achieve or maintain a healthy weight. Get regular exercise Get regular exercise. This is one of the most important things you can do for your health. Most adults should:  Exercise for at least 150 minutes each week. The exercise should increase your heart rate and make you sweat (moderate-intensity exercise).  Do strengthening exercises at least twice a week. This is in addition to the moderate-intensity exercise.  Spend less time sitting. Even light physical activity can be beneficial. Watch cholesterol and blood lipids Have your blood tested for lipids and cholesterol at 72 years of age, then have this test every 5 years. Have your cholesterol levels checked more often if:  Your lipid or cholesterol levels are high.  You are older than 72 years of age.  You are at high risk for heart disease. What should I know about cancer screening? Depending on your health history and family history, you may need to have cancer screening at various ages. This may include screening for:  Breast cancer.  Cervical cancer.  Colorectal  cancer.  Skin cancer.  Lung cancer. What should I know about heart disease, diabetes, and high blood pressure? Blood pressure and heart disease  High blood pressure causes heart disease and increases the risk of stroke. This is more likely to develop in people who have high blood pressure readings, are of African descent, or are overweight.  Have your blood pressure checked: ? Every 3-5 years if you are 18-39 years of age. ? Every year if you are 40 years old or older. Diabetes Have regular diabetes screenings. This checks your fasting blood sugar level. Have the screening done:  Once every three years after age 40 if you are at a normal weight and have a low risk for diabetes.  More often and at a younger age if you are overweight or have a high risk for diabetes. What should I know about preventing infection? Hepatitis B If you have a higher risk for hepatitis B, you should be screened for this virus. Talk with your health care provider to find out if you are at risk for hepatitis B infection. Hepatitis C Testing is recommended for:  Everyone born from 1945 through 1965.  Anyone with known risk factors for hepatitis C. Sexually transmitted infections (STIs)  Get screened for STIs, including gonorrhea and chlamydia, if: ? You are sexually active and are younger than 72 years of age. ? You are older than 72 years of age and your health care provider tells you that you are at risk for this type of   infection. ? Your sexual activity has changed since you were last screened, and you are at increased risk for chlamydia or gonorrhea. Ask your health care provider if you are at risk.  Ask your health care provider about whether you are at high risk for HIV. Your health care provider may recommend a prescription medicine to help prevent HIV infection. If you choose to take medicine to prevent HIV, you should first get tested for HIV. You should then be tested every 3 months for as long as  you are taking the medicine. Pregnancy  If you are about to stop having your period (premenopausal) and you may become pregnant, seek counseling before you get pregnant.  Take 400 to 800 micrograms (mcg) of folic acid every day if you become pregnant.  Ask for birth control (contraception) if you want to prevent pregnancy. Osteoporosis and menopause Osteoporosis is a disease in which the bones lose minerals and strength with aging. This can result in bone fractures. If you are 65 years old or older, or if you are at risk for osteoporosis and fractures, ask your health care provider if you should:  Be screened for bone loss.  Take a calcium or vitamin D supplement to lower your risk of fractures.  Be given hormone replacement therapy (HRT) to treat symptoms of menopause. Follow these instructions at home: Lifestyle  Do not use any products that contain nicotine or tobacco, such as cigarettes, e-cigarettes, and chewing tobacco. If you need help quitting, ask your health care provider.  Do not use street drugs.  Do not share needles.  Ask your health care provider for help if you need support or information about quitting drugs. Alcohol use  Do not drink alcohol if: ? Your health care provider tells you not to drink. ? You are pregnant, may be pregnant, or are planning to become pregnant.  If you drink alcohol: ? Limit how much you use to 0-1 drink a day. ? Limit intake if you are breastfeeding.  Be aware of how much alcohol is in your drink. In the [place] one drink equals one 12 oz bottle of beer (355 mL), one 5 oz glass of wine (148 mL), or one 1 oz glass of hard liquor (44 mL). General instructions  Schedule regular health, dental, and eye exams.  Stay current with your vaccines.  Tell your health care provider if: ? You often feel depressed. ? You have ever been abused or do not feel safe at home. Summary  Adopting a healthy lifestyle and getting preventive care are  important in promoting health and wellness.  Follow your health care provider's instructions about healthy diet, exercising, and getting tested or screened for diseases.  Follow your health care provider's instructions on monitoring your cholesterol and blood pressure. This information is not intended to replace advice given to you by your health care provider. Make sure you discuss any questions you have with your health care provider. Document Revised: 01/10/2018 Document Reviewed: 01/10/2018 Elsevier Patient Education  2021 Elsevier Inc.  

## 2020-06-05 ENCOUNTER — Ambulatory Visit (INDEPENDENT_AMBULATORY_CARE_PROVIDER_SITE_OTHER): Payer: Medicare HMO | Admitting: Internal Medicine

## 2020-06-05 ENCOUNTER — Encounter: Payer: Self-pay | Admitting: Internal Medicine

## 2020-06-05 ENCOUNTER — Other Ambulatory Visit: Payer: Self-pay

## 2020-06-05 VITALS — BP 130/72 | HR 80 | Temp 98.2°F | Ht 64.0 in | Wt 150.0 lb

## 2020-06-05 DIAGNOSIS — Z0000 Encounter for general adult medical examination without abnormal findings: Secondary | ICD-10-CM

## 2020-06-05 DIAGNOSIS — M85852 Other specified disorders of bone density and structure, left thigh: Secondary | ICD-10-CM

## 2020-06-05 DIAGNOSIS — M1711 Unilateral primary osteoarthritis, right knee: Secondary | ICD-10-CM | POA: Insufficient documentation

## 2020-06-05 DIAGNOSIS — K219 Gastro-esophageal reflux disease without esophagitis: Secondary | ICD-10-CM

## 2020-06-05 DIAGNOSIS — M85851 Other specified disorders of bone density and structure, right thigh: Secondary | ICD-10-CM

## 2020-06-05 DIAGNOSIS — I10 Essential (primary) hypertension: Secondary | ICD-10-CM

## 2020-06-05 DIAGNOSIS — R7303 Prediabetes: Secondary | ICD-10-CM

## 2020-06-05 LAB — LIPID PANEL
Cholesterol: 201 mg/dL — ABNORMAL HIGH (ref 0–200)
HDL: 56.9 mg/dL (ref >39.00)
LDL Cholesterol: 124 mg/dL — ABNORMAL HIGH (ref 0–99)
NonHDL: 144.54
Total CHOL/HDL Ratio: 4
Triglycerides: 105 mg/dL (ref 0.0–149.0)
VLDL: 21 mg/dL (ref 0.0–40.0)

## 2020-06-05 LAB — CBC WITH DIFFERENTIAL/PLATELET
Basophils Absolute: 0 10*3/uL (ref 0.0–0.1)
Basophils Relative: 0.5 % (ref 0.0–3.0)
Eosinophils Absolute: 0 10*3/uL (ref 0.0–0.7)
Eosinophils Relative: 0.7 % (ref 0.0–5.0)
HCT: 37.6 % (ref 36.0–46.0)
Hemoglobin: 12.8 g/dL (ref 12.0–15.0)
Lymphocytes Relative: 29.1 % (ref 12.0–46.0)
Lymphs Abs: 1.8 10*3/uL (ref 0.7–4.0)
MCHC: 34 g/dL (ref 30.0–36.0)
MCV: 86.9 fl (ref 78.0–100.0)
Monocytes Absolute: 0.4 10*3/uL (ref 0.1–1.0)
Monocytes Relative: 5.7 % (ref 3.0–12.0)
Neutro Abs: 3.9 10*3/uL (ref 1.4–7.7)
Neutrophils Relative %: 64 % (ref 43.0–77.0)
Platelets: 205 10*3/uL (ref 150.0–400.0)
RBC: 4.33 Mil/uL (ref 3.87–5.11)
RDW: 13.5 % (ref 11.5–15.5)
WBC: 6.1 10*3/uL (ref 4.0–10.5)

## 2020-06-05 LAB — COMPREHENSIVE METABOLIC PANEL
ALT: 13 U/L (ref 0–35)
AST: 14 U/L (ref 0–37)
Albumin: 4.4 g/dL (ref 3.5–5.2)
Alkaline Phosphatase: 95 U/L (ref 39–117)
BUN: 14 mg/dL (ref 6–23)
CO2: 29 mEq/L (ref 19–32)
Calcium: 9.6 mg/dL (ref 8.4–10.5)
Chloride: 104 mEq/L (ref 96–112)
Creatinine, Ser: 0.79 mg/dL (ref 0.40–1.20)
GFR: 75.01 mL/min (ref >60.00)
Glucose, Bld: 105 mg/dL — ABNORMAL HIGH (ref 70–99)
Potassium: 4.2 mEq/L (ref 3.5–5.1)
Sodium: 140 mEq/L (ref 135–145)
Total Bilirubin: 0.6 mg/dL (ref 0.2–1.2)
Total Protein: 7.4 g/dL (ref 6.0–8.3)

## 2020-06-05 LAB — TSH: TSH: 1.68 u[IU]/mL (ref 0.35–4.50)

## 2020-06-05 LAB — HEMOGLOBIN A1C: Hgb A1c MFr Bld: 5.9 % (ref 4.6–6.5)

## 2020-06-05 NOTE — Assessment & Plan Note (Signed)
Chronic GERD controlled Continue omeprazole 20 mg daily  

## 2020-06-05 NOTE — Assessment & Plan Note (Signed)
Chronic Check a1c Low sugar / carb diet Stressed regular exercise  

## 2020-06-05 NOTE — Assessment & Plan Note (Addendum)
Chronic Following with ortho - Dr Wynelle Link Has done PT, gel injections (did not help) and cortisone injections Encouraged regular exercise and losing a couple of pounds may help

## 2020-06-05 NOTE — Assessment & Plan Note (Signed)
Chronic DEXA up-to-date Continue calcium and vitamin D Encouraged regular exercise

## 2020-06-05 NOTE — Assessment & Plan Note (Signed)
Chronic BP well controlled Continue lotensin 40 mg daily cmp

## 2020-06-23 DIAGNOSIS — C380 Malignant neoplasm of heart: Secondary | ICD-10-CM | POA: Diagnosis not present

## 2020-07-01 DIAGNOSIS — R12 Heartburn: Secondary | ICD-10-CM | POA: Diagnosis not present

## 2020-07-01 DIAGNOSIS — R197 Diarrhea, unspecified: Secondary | ICD-10-CM | POA: Diagnosis not present

## 2020-07-01 DIAGNOSIS — R635 Abnormal weight gain: Secondary | ICD-10-CM | POA: Diagnosis not present

## 2020-07-01 DIAGNOSIS — R110 Nausea: Secondary | ICD-10-CM | POA: Diagnosis not present

## 2020-07-01 DIAGNOSIS — R140 Abdominal distension (gaseous): Secondary | ICD-10-CM | POA: Diagnosis not present

## 2020-07-02 DIAGNOSIS — Z809 Family history of malignant neoplasm, unspecified: Secondary | ICD-10-CM | POA: Diagnosis not present

## 2020-07-28 DIAGNOSIS — Z1231 Encounter for screening mammogram for malignant neoplasm of breast: Secondary | ICD-10-CM | POA: Diagnosis not present

## 2020-07-28 LAB — HM MAMMOGRAPHY

## 2020-08-05 ENCOUNTER — Encounter: Payer: Self-pay | Admitting: Internal Medicine

## 2020-09-14 DIAGNOSIS — Z90710 Acquired absence of both cervix and uterus: Secondary | ICD-10-CM | POA: Diagnosis not present

## 2020-09-14 DIAGNOSIS — C541 Malignant neoplasm of endometrium: Secondary | ICD-10-CM | POA: Diagnosis not present

## 2020-09-14 DIAGNOSIS — Z08 Encounter for follow-up examination after completed treatment for malignant neoplasm: Secondary | ICD-10-CM | POA: Diagnosis not present

## 2020-09-14 DIAGNOSIS — Z8542 Personal history of malignant neoplasm of other parts of uterus: Secondary | ICD-10-CM | POA: Diagnosis not present

## 2020-10-06 DIAGNOSIS — Z452 Encounter for adjustment and management of vascular access device: Secondary | ICD-10-CM | POA: Diagnosis not present

## 2020-10-06 DIAGNOSIS — Z789 Other specified health status: Secondary | ICD-10-CM | POA: Diagnosis not present

## 2020-11-24 NOTE — Progress Notes (Signed)
Subjective:    Patient ID: Kendra Leonard, female    DOB: 24-May-1950, 70 y.o.   MRN: [id number]  This visit occurred during the SARS-CoV-2 public health emergency.  Safety protocols were in place, including screening questions prior to the visit, additional usage of staff PPE, and extensive cleaning of exam room while observing appropriate contact time as indicated for disinfecting solutions.    HPI The patient is here for an acute visit.  10/06/2020 she had her Port-A-Cath removed and she had dissolvable stitches placed.  Last week and she noticed that a stitch was poking out of her skin and she pulled on it and it was much longer than she thought.  A long thread came out.  She did stop pulling at 1 point and just cut it.  The medial incision did open up a little bit.  It was a little red and did hurt, but she figured it was just aggravated.  It has gotten a little bit more red and last night she noticed a small amount of blood and pus coming from it.  She is concerned about infection, which is why she is here today.  She has not had any fevers or chills.  The area is sore.  She has not seen any discharge today.     Medications and allergies reviewed with patient and updated if appropriate.  Patient Active Problem List   Diagnosis Date Noted   Infected incision 11/25/2020   Osteoarthritis of right knee 06/05/2020   Neuropathy due to chemotherapeutic drug ([place]) 06/04/2018   Right shoulder pain 06/04/2018   Snoring 06/04/2018   History of endometrial cancer, 2019, adenocarcinoma 11/08/2017   Osteopenia 06/07/2016   Prediabetes 06/07/2016   Irritable bowel syndrome (IBS) 11/03/2014   Hematuria, microscopic 04/27/2011   DIVERTICULITIS, HX OF 05/28/2009   POPLITEAL CYST, RIGHT 04/22/2008   COLONIC POLYPS, HX OF 01/28/2008   Essential hypertension 01/26/2007   EROSIVE ESOPHAGITIS 12/22/2006   DIVERTICULOSIS, COLON 12/22/2006   GERD 10/24/2006   COMMON MIGRAINE 06/27/2006     Current Outpatient Medications on File Prior to Visit  Medication Sig Dispense Refill   benazepril (LOTENSIN) 40 MG tablet Take 1 tablet (40 mg total) by mouth daily. 90 tablet 3   Calcium Carbonate-Vitamin D (CALCIUM + D PO) Take by mouth daily.     Cholecalciferol (VITAMIN D-3) 1000 UNITS CAPS Take by mouth daily.     gabapentin (NEURONTIN) 300 MG capsule Take by mouth.     Omega-3 Fatty Acids (FISH OIL) 1200 MG CAPS Take by mouth daily.     omeprazole (PRILOSEC) 20 MG capsule TAKE 1 CAPSULE 30 MINUTES  BEFORE BREAKFAST *SANDOZ   MFR* 90 capsule 3   No current facility-administered medications on file prior to visit.    Past Medical History:  Diagnosis Date   Colon polyp 2008   3 mm   DJD (degenerative joint disease) of knee    Dr Maureen Ralphs   GERD (gastroesophageal reflux disease) 2008   Dr Carlean Purl   Hypertension     Past Surgical History:  Procedure Laterality Date   CYSTOSCOPY     X4 for microscopic hematuria   G 2  P 2     intraarticular steroid injection Right 2013 & 2014   Dr Maureen Ralphs   LAPAROSCOPY     For Infertility work up   UPPER GI ENDOSCOPY  2008   Dr Milus Mallick TOOTH EXTRACTION      Social History  Socioeconomic History   Marital status: Married    Spouse name: Not on file   Number of children: Not on file   Years of education: Not on file   Highest education level: Not on file  Occupational History   Not on file  Tobacco Use   Smoking status: Never   Smokeless tobacco: Never  Vaping Use   Vaping Use: Never used  Substance and Sexual Activity   Alcohol use: Yes    Comment: Socially    Drug use: No   Sexual activity: Not on file  Other Topics Concern   Not on file  Social History Narrative   Walking regularly for exercise   Social Determinants of Health   Financial Resource Strain: Not on file  Food Insecurity: Not on file  Transportation Needs: Not on file  Physical Activity: Not on file  Stress: Not on file  Social  Connections: Not on file    Family History  Problem Relation Age of Onset   Prostate cancer Father 76   Hypertension Father    Aneurysm Father        Thoracic Aneurysm   Dysrhythmia Father    GER disease Father        PMH DUD   Dementia Father    Parkinsonism Father    Uterine cancer Mother 56   Hypertension Mother    Other Mother        colovaginal fistula   Hypertension Brother    Diabetes Maternal Aunt    Diabetes Maternal Uncle    Tremor Maternal Aunt        2 aunts ( no FH Parkinson's)   Stroke Maternal Grandfather        >55   GER disease Brother    Colon cancer Neg Hx    Colon polyps Neg Hx    Rectal cancer Neg Hx    Stomach cancer Neg Hx     Review of Systems  Constitutional:  Negative for chills and fever.      Objective:   Vitals:   11/25/20 1102  BP: 118/78  Pulse: 75  Temp: 98.3 F (36.8 C)  SpO2: 96%   BP Readings from Last 3 Encounters:  11/25/20 118/78  06/05/20 130/72  08/23/19 106/78   Wt Readings from Last 3 Encounters:  11/25/20 150 lb (68 kg)  06/05/20 150 lb (68 kg)  08/23/19 147 lb (66.7 kg)   Body mass index is 25.75 kg/m.   Physical Exam Constitutional:      General: She is not in acute distress.    Appearance: Normal appearance. She is not ill-appearing.  HENT:     Head: Normocephalic and atraumatic.  Skin:    General: Skin is warm and dry.     Comments: Right upper chest incision well-healed on the lateral aspect, but dehiscence of medial aspect.  No active bleeding or discharge.  Surrounding erythema of medial aspect of incision.  Slight tenderness.  No fluctuance.  Neurological:     Mental Status: She is alert.           Assessment & Plan:    See Problem List for Assessment and Plan of chronic medical problems.

## 2020-11-25 ENCOUNTER — Other Ambulatory Visit: Payer: Self-pay

## 2020-11-25 ENCOUNTER — Ambulatory Visit (INDEPENDENT_AMBULATORY_CARE_PROVIDER_SITE_OTHER): Payer: Medicare HMO | Admitting: Internal Medicine

## 2020-11-25 ENCOUNTER — Encounter: Payer: Self-pay | Admitting: Internal Medicine

## 2020-11-25 VITALS — BP 118/78 | HR 75 | Temp 98.3°F | Ht 64.0 in | Wt 150.0 lb

## 2020-11-25 DIAGNOSIS — Z23 Encounter for immunization: Secondary | ICD-10-CM | POA: Diagnosis not present

## 2020-11-25 DIAGNOSIS — T8149XA Infection following a procedure, other surgical site, initial encounter: Secondary | ICD-10-CM | POA: Diagnosis not present

## 2020-11-25 MED ORDER — CEPHALEXIN 500 MG PO CAPS: 500.0000 mg | ORAL_CAPSULE | Freq: Three times a day (TID) | ORAL | 0 refills | Status: DC

## 2020-11-25 MED ORDER — MUPIROCIN CALCIUM 2 % EX CREA: 1.0000 "application " | TOPICAL_CREAM | Freq: Two times a day (BID) | CUTANEOUS | 0 refills | Status: DC

## 2020-11-25 NOTE — Addendum Note (Signed)
Addended by: Marcina Millard on: 11/25/2020 04:39 PM   Modules accepted: Orders

## 2020-11-25 NOTE — Patient Instructions (Addendum)
     Medications changes include :   antibiotic and antibiotic cream x 1 week    Your prescription(s) have been submitted to your pharmacy. Please take as directed and contact our office if you believe you are having problem(s) with the medication(s).   Please call if there is no improvement in your symptoms.

## 2020-11-25 NOTE — Assessment & Plan Note (Signed)
Acute Incision from Port-A-Cath-Port-A-Cath removed over a month ago and she had dissolvable stitches placed, but she did pull on 1 of those stitches and had dehiscence of the medial aspect of the incision There was some blood and pus discharge from the open wound last night Concern for infection Start Keflex 500 mg 3 times daily x7 days Apply Bactroban cream twice daily Tdap today given open infected wound She will monitor closely and call with any concerns

## 2020-11-26 ENCOUNTER — Other Ambulatory Visit: Payer: Self-pay | Admitting: Internal Medicine

## 2020-11-26 NOTE — Telephone Encounter (Signed)
Patient calling to check status of another affordable cream  Please advise

## 2020-12-04 DIAGNOSIS — H5213 Myopia, bilateral: Secondary | ICD-10-CM | POA: Diagnosis not present

## 2020-12-04 DIAGNOSIS — H40013 Open angle with borderline findings, low risk, bilateral: Secondary | ICD-10-CM | POA: Diagnosis not present

## 2020-12-04 DIAGNOSIS — Z961 Presence of intraocular lens: Secondary | ICD-10-CM | POA: Diagnosis not present

## 2021-03-08 DIAGNOSIS — C541 Malignant neoplasm of endometrium: Secondary | ICD-10-CM | POA: Diagnosis not present

## 2021-03-22 ENCOUNTER — Other Ambulatory Visit: Payer: Self-pay | Admitting: Internal Medicine

## 2021-03-26 ENCOUNTER — Telehealth: Payer: Self-pay | Admitting: Internal Medicine

## 2021-03-26 NOTE — Telephone Encounter (Signed)
Patient calling in  Says she needs prescription # & NDC # for tdap shot she received in office 10/2020 for insurance purposes  Please fu w/ patient [phone]

## 2021-03-26 NOTE — Telephone Encounter (Signed)
Left message for patient.  If she calls back I can send info to her via my-chart or okay to give her info from below:  Manufacture: [employer] Lot # [id number] NDC# [phone] Date: Given 11/25/20 Location: Left Deltoid

## 2021-03-29 NOTE — Telephone Encounter (Signed)
Pt called back and was given the following info.   Manufacture: [employer] Lot # [id number] NDC# [phone] Date: Given 11/25/20 Location: Left Deltoid  FYI

## 2021-04-30 DIAGNOSIS — H5213 Myopia, bilateral: Secondary | ICD-10-CM | POA: Diagnosis not present

## 2021-04-30 DIAGNOSIS — H02403 Unspecified ptosis of bilateral eyelids: Secondary | ICD-10-CM | POA: Diagnosis not present

## 2021-04-30 DIAGNOSIS — Z961 Presence of intraocular lens: Secondary | ICD-10-CM | POA: Diagnosis not present

## 2021-06-03 DIAGNOSIS — Z1272 Encounter for screening for malignant neoplasm of vagina: Secondary | ICD-10-CM | POA: Diagnosis not present

## 2021-06-03 DIAGNOSIS — Z6827 Body mass index (BMI) 27.0-27.9, adult: Secondary | ICD-10-CM | POA: Diagnosis not present

## 2021-06-03 DIAGNOSIS — Z8542 Personal history of malignant neoplasm of other parts of uterus: Secondary | ICD-10-CM | POA: Diagnosis not present

## 2021-06-03 DIAGNOSIS — Z779 Other contact with and (suspected) exposures hazardous to health: Secondary | ICD-10-CM | POA: Diagnosis not present

## 2021-06-03 DIAGNOSIS — Z01419 Encounter for gynecological examination (general) (routine) without abnormal findings: Secondary | ICD-10-CM | POA: Diagnosis not present

## 2021-06-06 ENCOUNTER — Encounter: Payer: Self-pay | Admitting: Internal Medicine

## 2021-06-06 DIAGNOSIS — E785 Hyperlipidemia, unspecified: Secondary | ICD-10-CM | POA: Insufficient documentation

## 2021-06-06 NOTE — Patient Instructions (Addendum)
? ? ? ?Blood work was ordered.   ? ? ?Medications changes include :   start pepcid 40 mg daily for your reflux ? ? ?Your prescription(s) have been sent to your pharmacy.  ? ? ?Return in about 1 year (around 06/08/2022) for Physical Exam. ? ? ?Health Maintenance, Female ?Adopting a healthy lifestyle and getting preventive care are important in promoting health and wellness. Ask your health care provider about: ?The right schedule for you to have regular tests and exams. ?Things you can do on your own to prevent diseases and keep yourself healthy. ?What should I know about diet, weight, and exercise? ?Eat a healthy diet ? ?Eat a diet that includes plenty of vegetables, fruits, low-fat dairy products, and lean protein. ?Do not eat a lot of foods that are high in solid fats, added sugars, or sodium. ?Maintain a healthy weight ?Body mass index (BMI) is used to identify weight problems. It estimates body fat based on height and weight. Your health care provider can help determine your BMI and help you achieve or maintain a healthy weight. ?Get regular exercise ?Get regular exercise. This is one of the most important things you can do for your health. Most adults should: ?Exercise for at least 150 minutes each week. The exercise should increase your heart rate and make you sweat (moderate-intensity exercise). ?Do strengthening exercises at least twice a week. This is in addition to the moderate-intensity exercise. ?Spend less time sitting. Even light physical activity can be beneficial. ?Watch cholesterol and blood lipids ?Have your blood tested for lipids and cholesterol at 73 years of age, then have this test every 5 years. ?Have your cholesterol levels checked more often if: ?Your lipid or cholesterol levels are high. ?You are older than 73 years of age. ?You are at high risk for heart disease. ?What should I know about cancer screening? ?Depending on your health history and family history, you may need to have cancer  screening at various ages. This may include screening for: ?Breast cancer. ?Cervical cancer. ?Colorectal cancer. ?Skin cancer. ?Lung cancer. ?What should I know about heart disease, diabetes, and high blood pressure? ?Blood pressure and heart disease ?High blood pressure causes heart disease and increases the risk of stroke. This is more likely to develop in people who have high blood pressure readings or are overweight. ?Have your blood pressure checked: ?Every 3-5 years if you are 35-55 years of age. ?Every year if you are 23 years old or older. ?Diabetes ?Have regular diabetes screenings. This checks your fasting blood sugar level. Have the screening done: ?Once every three years after age 81 if you are at a normal weight and have a low risk for diabetes. ?More often and at a younger age if you are overweight or have a high risk for diabetes. ?What should I know about preventing infection? ?Hepatitis B ?If you have a higher risk for hepatitis B, you should be screened for this virus. Talk with your health care provider to find out if you are at risk for hepatitis B infection. ?Hepatitis C ?Testing is recommended for: ?Everyone born from [birth year] through 1967. ?Anyone with known risk factors for hepatitis C. ?Sexually transmitted infections (STIs) ?Get screened for STIs, including gonorrhea and chlamydia, if: ?You are sexually active and are younger than 73 years of age. ?You are older than 73 years of age and your health care provider tells you that you are at risk for this type of infection. ?Your sexual activity has changed since you  were last screened, and you are at increased risk for chlamydia or gonorrhea. Ask your health care provider if you are at risk. ?Ask your health care provider about whether you are at high risk for HIV. Your health care provider may recommend a prescription medicine to help prevent HIV infection. If you choose to take medicine to prevent HIV, you should first get tested for HIV. You  should then be tested every 3 months for as long as you are taking the medicine. ?Pregnancy ?If you are about to stop having your period (premenopausal) and you may become pregnant, seek counseling before you get pregnant. ?Take 400 to 800 micrograms (mcg) of folic acid every day if you become pregnant. ?Ask for birth control (contraception) if you want to prevent pregnancy. ?Osteoporosis and menopause ?Osteoporosis is a disease in which the bones lose minerals and strength with aging. This can result in bone fractures. If you are 46 years old or older, or if you are at risk for osteoporosis and fractures, ask your health care provider if you should: ?Be screened for bone loss. ?Take a calcium or vitamin D supplement to lower your risk of fractures. ?Be given hormone replacement therapy (HRT) to treat symptoms of menopause. ?Follow these instructions at home: ?Alcohol use ?Do not drink alcohol if: ?Your health care provider tells you not to drink. ?You are pregnant, may be pregnant, or are planning to become pregnant. ?If you drink alcohol: ?Limit how much you have to: ?0-1 drink a day. ?Know how much alcohol is in your drink. In the [place] one drink equals one 12 oz bottle of beer (355 mL), one 5 oz glass of wine (148 mL), or one 1? oz glass of hard liquor (44 mL). ?Lifestyle ?Do not use any products that contain nicotine or tobacco. These products include cigarettes, chewing tobacco, and vaping devices, such as e-cigarettes. If you need help quitting, ask your health care provider. ?Do not use street drugs. ?Do not share needles. ?Ask your health care provider for help if you need support or information about quitting drugs. ?General instructions ?Schedule regular health, dental, and eye exams. ?Stay current with your vaccines. ?Tell your health care provider if: ?You often feel depressed. ?You have ever been abused or do not feel safe at home. ?Summary ?Adopting a healthy lifestyle and getting preventive care are  important in promoting health and wellness. ?Follow your health care provider's instructions about healthy diet, exercising, and getting tested or screened for diseases. ?Follow your health care provider's instructions on monitoring your cholesterol and blood pressure. ?This information is not intended to replace advice given to you by your health care provider. Make sure you discuss any questions you have with your health care provider. ?Document Revised: 06/08/2020 Document Reviewed: 06/08/2020 ?Elsevier Patient Education ? [place]. ? ?

## 2021-06-06 NOTE — Progress Notes (Signed)
? ? ?Subjective:  ? ? Patient ID: Kendra Leonard, female    DOB: [birth year], 71 y.o.   MRN: [id number] ? ? ?This visit occurred during the SARS-CoV-2 public health emergency.  Safety protocols were in place, including screening questions prior to the visit, additional usage of staff PPE, and extensive cleaning of exam room while observing appropriate contact time as indicated for disinfecting solutions. ? ? ? ?HPI ?Jette is here for  ?Chief Complaint  ?Patient presents with  ? Annual Exam  ? ? ?She has large breasts and has some upper back pain and indentations from her bra.  She feels it is affecting her posture.  ? ?Her hands gets stiff.  She does exercise them.  Her toes feel stiff too.   ? ?She has some difficulty recalling things - always thought it was the chemo - but it never improved.  She wonders if it is her age, chemo or what.  Her father had Parkinson's and she does worry if there is some hand stiffness are related to that. ? ? ?Medications and allergies reviewed with patient and updated if appropriate. ? ? ?Current Outpatient Medications on File Prior to Visit  ?Medication Sig Dispense Refill  ? benazepril (LOTENSIN) 40 MG tablet TAKE 1 TABLET DAILY 90 tablet 3  ? Calcium Carbonate-Vitamin D (CALCIUM + D PO) Take by mouth daily.    ? Cholecalciferol (VITAMIN D-3) 1000 UNITS CAPS Take by mouth daily.    ? gabapentin (NEURONTIN) 300 MG capsule Take by mouth.    ? Omega-3 Fatty Acids (FISH OIL) 1200 MG CAPS Take by mouth daily.    ? omeprazole (PRILOSEC) 20 MG capsule TAKE 1 CAPSULE 30 MINUTES  BEFORE BREAKFAST BY SANDOZ MFR 90 capsule 3  ? Turmeric (QC TUMERIC COMPLEX PO) Take by mouth.    ? ?No current facility-administered medications on file prior to visit.  ? ? ?Review of Systems  ?Constitutional:  Negative for fever.  ?Eyes:  Negative for visual disturbance.  ?Respiratory:  Negative for cough, shortness of breath and wheezing.   ?Cardiovascular:  Negative for chest pain, palpitations and leg swelling.   ?Gastrointestinal:  Negative for abdominal pain, blood in stool, constipation, diarrhea and nausea.  ?     Has reflux frequently  ?Genitourinary:  Negative for dysuria.  ?Musculoskeletal:  Positive for arthralgias (mild). Negative for back pain.  ?Skin:  Negative for rash (gets intermittent rash under breasts).  ?Neurological:  Negative for light-headedness and headaches.  ?Psychiatric/Behavioral:  Negative for dysphoric mood. The patient is not nervous/anxious.   ? ?   ?Objective:  ? ?Vitals:  ? 06/07/21 0932  ?BP: 110/74  ?Pulse: 74  ?Temp: 98.1 ?F (36.7 ?C)  ?SpO2: 98%  ? ?Filed Weights  ? 06/07/21 0932  ?Weight: 148 lb (67.1 kg)  ? ?Body mass index is 25.4 kg/m?. ? ?BP Readings from Last 3 Encounters:  ?06/07/21 110/74  ?11/25/20 118/78  ?06/05/20 130/72  ? ? ?Wt Readings from Last 3 Encounters:  ?06/07/21 148 lb (67.1 kg)  ?11/25/20 150 lb (68 kg)  ?06/05/20 150 lb (68 kg)  ? ? ? ?  06/07/2021  ?  9:39 AM 06/05/2020  ? 11:21 AM 06/05/2019  ?  9:00 AM 06/06/2017  ?  7:51 AM 06/12/2014  ?  8:39 AM  ?Depression screen PHQ 2/9  ?Decreased Interest 0 0 0 0 0  ?Down, Depressed, Hopeless 0 0 0 0 0  ?PHQ - 2 Score 0 0 0 0 0  ?Altered  sleeping 0      ?Tired, decreased energy 0      ?Change in appetite 0      ?Feeling bad or failure about yourself  0      ?Trouble concentrating 0      ?Moving slowly or fidgety/restless 0      ?Suicidal thoughts 0      ?PHQ-9 Score 0      ?Difficult doing work/chores Not difficult at all      ? ? ? ?   ? View : No data to display.  ?  ?  ?  ? ? ? ? ?  ?Physical Exam ?Constitutional: She appears well-developed and well-nourished. No distress.  ?HENT:  ?Head: Normocephalic and atraumatic.  ?Right Ear: External ear normal. Normal ear canal and TM ?Left Ear: External ear normal.  Normal ear canal and TM ?Mouth/Throat: Oropharynx is clear and moist.  ?Eyes: Conjunctivae and EOM are normal.  ?Neck: Neck supple. No tracheal deviation present. No thyromegaly present.  ?No carotid bruit  ?Cardiovascular:  Normal rate, regular rhythm and normal heart sounds.   ?No murmur heard.  No edema. ?Pulmonary/Chest: Effort normal and breath sounds normal. No respiratory distress. She has no wheezes. She has no rales.  ?Breast: deferred   ?Abdominal: Soft. She exhibits no distension. There is no tenderness.  ?Lymphadenopathy: She has no cervical adenopathy.  ?Skin: Skin is warm and dry. She is not diaphoretic.  ?Psychiatric: She has a normal mood and affect. Her behavior is normal.  ? ? ? ?Lab Results  ?Component Value Date  ? WBC 6.1 06/05/2020  ? HGB 12.8 06/05/2020  ? HCT 37.6 06/05/2020  ? PLT 205.0 06/05/2020  ? GLUCOSE 105 (H) 06/05/2020  ? CHOL 201 (H) 06/05/2020  ? TRIG 105.0 06/05/2020  ? HDL 56.90 06/05/2020  ? LDLCALC 124 (H) 06/05/2020  ? ALT 13 06/05/2020  ? AST 14 06/05/2020  ? NA 140 06/05/2020  ? K 4.2 06/05/2020  ? CL 104 06/05/2020  ? CREATININE 0.79 06/05/2020  ? BUN 14 06/05/2020  ? CO2 29 06/05/2020  ? TSH 1.68 06/05/2020  ? HGBA1C 5.9 06/05/2020  ? ? ?The 10-year ASCVD risk score (Arnett DK, et al., 2019) is: 11.6% ?  Values used to calculate the score: ?    Age: 3 years ?    Sex: Female ?    Is Non-Hispanic African American: No ?    Diabetic: No ?    Tobacco smoker: No ?    Systolic Blood Pressure: 462 mmHg ?    Is BP treated: Yes ?    HDL Cholesterol: 56.9 mg/dL ?    Total Cholesterol: 201 mg/dL ? ? ?   ?Assessment & Plan:  ? ?Physical exam: ?Screening blood work  ordered ?Exercise  regular - goes to gym 2-3 times a week, walking ?Weight   normal ?Substance abuse  none ? ? ?Reviewed recommended immunizations. ? ? ?Health Maintenance  ?Topic Date Due  ? Zoster Vaccines- Shingrix (1 of 2) 09/07/2021 (Originally 07/19/1969)  ? MAMMOGRAM  07/28/2021  ? INFLUENZA VACCINE  08/31/2021  ? DEXA SCAN  05/21/2023  ? COLONOSCOPY (Pts 45-44yr Insurance coverage will need to be confirmed)  12/31/2026  ? TETANUS/TDAP  11/26/2030  ? Pneumonia Vaccine 73 Years old  Completed  ? COVID-19 Vaccine  Completed  ? Hepatitis C  Screening  Completed  ? HPV VACCINES  Aged Out  ?  ? ? ? ? ? ? ?See Problem List for Assessment  and Plan of chronic medical problems. ? ? ? ? ?

## 2021-06-07 ENCOUNTER — Ambulatory Visit (INDEPENDENT_AMBULATORY_CARE_PROVIDER_SITE_OTHER): Payer: Medicare HMO | Admitting: Internal Medicine

## 2021-06-07 VITALS — BP 110/74 | HR 74 | Temp 98.1°F | Ht 64.0 in | Wt 148.0 lb

## 2021-06-07 DIAGNOSIS — M85852 Other specified disorders of bone density and structure, left thigh: Secondary | ICD-10-CM

## 2021-06-07 DIAGNOSIS — G620 Drug-induced polyneuropathy: Secondary | ICD-10-CM

## 2021-06-07 DIAGNOSIS — K219 Gastro-esophageal reflux disease without esophagitis: Secondary | ICD-10-CM

## 2021-06-07 DIAGNOSIS — M85851 Other specified disorders of bone density and structure, right thigh: Secondary | ICD-10-CM | POA: Diagnosis not present

## 2021-06-07 DIAGNOSIS — T451X5A Adverse effect of antineoplastic and immunosuppressive drugs, initial encounter: Secondary | ICD-10-CM

## 2021-06-07 DIAGNOSIS — E7849 Other hyperlipidemia: Secondary | ICD-10-CM | POA: Diagnosis not present

## 2021-06-07 DIAGNOSIS — Z0000 Encounter for general adult medical examination without abnormal findings: Secondary | ICD-10-CM | POA: Diagnosis not present

## 2021-06-07 DIAGNOSIS — R7303 Prediabetes: Secondary | ICD-10-CM

## 2021-06-07 DIAGNOSIS — I10 Essential (primary) hypertension: Secondary | ICD-10-CM | POA: Diagnosis not present

## 2021-06-07 LAB — CBC WITH DIFFERENTIAL/PLATELET
Basophils Absolute: 0.1 10*3/uL (ref 0.0–0.1)
Basophils Relative: 0.9 % (ref 0.0–3.0)
Eosinophils Absolute: 0.1 10*3/uL (ref 0.0–0.7)
Eosinophils Relative: 1.3 % (ref 0.0–5.0)
HCT: 38.9 % (ref 36.0–46.0)
Hemoglobin: 13.1 g/dL (ref 12.0–15.0)
Lymphocytes Relative: 29 % (ref 12.0–46.0)
Lymphs Abs: 1.6 10*3/uL (ref 0.7–4.0)
MCHC: 33.7 g/dL (ref 30.0–36.0)
MCV: 88.1 fl (ref 78.0–100.0)
Monocytes Absolute: 0.3 10*3/uL (ref 0.1–1.0)
Monocytes Relative: 5.6 % (ref 3.0–12.0)
Neutro Abs: 3.6 10*3/uL (ref 1.4–7.7)
Neutrophils Relative %: 63.2 % (ref 43.0–77.0)
Platelets: 218 10*3/uL (ref 150.0–400.0)
RBC: 4.42 Mil/uL (ref 3.87–5.11)
RDW: 14 % (ref 11.5–15.5)
WBC: 5.6 10*3/uL (ref 4.0–10.5)

## 2021-06-07 LAB — COMPREHENSIVE METABOLIC PANEL
ALT: 15 U/L (ref 0–35)
AST: 19 U/L (ref 0–37)
Albumin: 4.3 g/dL (ref 3.5–5.2)
Alkaline Phosphatase: 90 U/L (ref 39–117)
BUN: 15 mg/dL (ref 6–23)
CO2: 28 mEq/L (ref 19–32)
Calcium: 9.5 mg/dL (ref 8.4–10.5)
Chloride: 101 mEq/L (ref 96–112)
Creatinine, Ser: 0.8 mg/dL (ref 0.40–1.20)
GFR: 73.37 mL/min (ref >60.00)
Glucose, Bld: 99 mg/dL (ref 70–99)
Potassium: 4 mEq/L (ref 3.5–5.1)
Sodium: 137 mEq/L (ref 135–145)
Total Bilirubin: 0.5 mg/dL (ref 0.2–1.2)
Total Protein: 7.3 g/dL (ref 6.0–8.3)

## 2021-06-07 LAB — LIPID PANEL
Cholesterol: 198 mg/dL (ref 0–200)
HDL: 58.2 mg/dL (ref >39.00)
LDL Cholesterol: 117 mg/dL — ABNORMAL HIGH (ref 0–99)
NonHDL: 139.33
Total CHOL/HDL Ratio: 3
Triglycerides: 113 mg/dL (ref 0.0–149.0)
VLDL: 22.6 mg/dL (ref 0.0–40.0)

## 2021-06-07 LAB — HEMOGLOBIN A1C: Hgb A1c MFr Bld: 5.8 % (ref 4.6–6.5)

## 2021-06-07 MED ORDER — FAMOTIDINE 40 MG PO TABS: 40.0000 mg | ORAL_TABLET | Freq: Every day | ORAL | 1 refills | Status: DC

## 2021-06-07 NOTE — Assessment & Plan Note (Signed)
Chronic Regular exercise and healthy diet encouraged Check lipid panel, CMP Continue lifestyle control 

## 2021-06-07 NOTE — Assessment & Plan Note (Signed)
Chronic Check a1c Low sugar / carb diet Stressed regular exercise  

## 2021-06-07 NOTE — Assessment & Plan Note (Signed)
Chronic DEXA up-to-date Continue regular exercise Continue calcium and vitamin D daily 

## 2021-06-07 NOTE — Assessment & Plan Note (Signed)
Chronic Blood pressure well controlled CMP Continue benazepril 40 mg daily 

## 2021-06-07 NOTE — Assessment & Plan Note (Signed)
Chronic ?Taking gabapentin ?

## 2021-06-07 NOTE — Assessment & Plan Note (Addendum)
Chronic ?GERD not controlled ?Continue omeprazole 20 mg daily ?Add famotidine 40 mg daily at the other time of the day ?Discussed the importance of getting Gherghe well controlled and once controlled for a while then can try tapering off omeprazole and see if she can just stay on the Pepcid 40 mg daily ?Discussed importance of having GERD controlled ?Advised her to send me a message via MyChart with any concerns ?

## 2021-06-09 DIAGNOSIS — R918 Other nonspecific abnormal finding of lung field: Secondary | ICD-10-CM | POA: Diagnosis not present

## 2021-06-09 DIAGNOSIS — C541 Malignant neoplasm of endometrium: Secondary | ICD-10-CM | POA: Diagnosis not present

## 2021-06-09 DIAGNOSIS — N281 Cyst of kidney, acquired: Secondary | ICD-10-CM | POA: Diagnosis not present

## 2021-07-29 DIAGNOSIS — Z1231 Encounter for screening mammogram for malignant neoplasm of breast: Secondary | ICD-10-CM | POA: Diagnosis not present

## 2021-07-29 LAB — HM MAMMOGRAPHY

## 2021-08-09 ENCOUNTER — Encounter: Payer: Self-pay | Admitting: Internal Medicine

## 2021-08-09 NOTE — Progress Notes (Signed)
Outside notes received. Information abstracted. Notes sent to scan.  

## 2021-08-30 DIAGNOSIS — K219 Gastro-esophageal reflux disease without esophagitis: Secondary | ICD-10-CM | POA: Diagnosis not present

## 2021-08-30 DIAGNOSIS — R1319 Other dysphagia: Secondary | ICD-10-CM | POA: Diagnosis not present

## 2021-09-13 DIAGNOSIS — C541 Malignant neoplasm of endometrium: Secondary | ICD-10-CM | POA: Diagnosis not present

## 2021-09-13 DIAGNOSIS — Z90710 Acquired absence of both cervix and uterus: Secondary | ICD-10-CM | POA: Diagnosis not present

## 2021-09-13 DIAGNOSIS — K5790 Diverticulosis of intestine, part unspecified, without perforation or abscess without bleeding: Secondary | ICD-10-CM | POA: Diagnosis not present

## 2021-10-12 ENCOUNTER — Ambulatory Visit (INDEPENDENT_AMBULATORY_CARE_PROVIDER_SITE_OTHER): Payer: Medicare HMO | Admitting: Podiatry

## 2021-10-12 ENCOUNTER — Ambulatory Visit (INDEPENDENT_AMBULATORY_CARE_PROVIDER_SITE_OTHER): Payer: Medicare HMO

## 2021-10-12 ENCOUNTER — Encounter: Payer: Self-pay | Admitting: Podiatry

## 2021-10-12 DIAGNOSIS — M2021 Hallux rigidus, right foot: Secondary | ICD-10-CM | POA: Diagnosis not present

## 2021-10-12 DIAGNOSIS — M778 Other enthesopathies, not elsewhere classified: Secondary | ICD-10-CM | POA: Diagnosis not present

## 2021-10-12 MED ORDER — TRIAMCINOLONE ACETONIDE 40 MG/ML IJ SUSP
20.0000 mg | Freq: Once | INTRAMUSCULAR | Status: AC
  Administered 2021-10-12: 20 mg

## 2021-10-17 NOTE — Progress Notes (Signed)
Subjective:  Patient ID: TESNEEM DUFRANE, female    DOB: [birth year],  MRN: [id number] HPI  Chief Complaint  Patient presents with   Foot Pain    Plantar forefoot and 2nd toe right - aching since April 2023, seen PCP and said to ice, also tried OTC insole-helped some   New Patient (Initial Visit)    71 y.o. female presents with the above complaint.   ROS: Denies fever chills nausea vomiting muscle aches pains calf pain back pain chest pain shortness of breath.  Past Medical History:  Diagnosis Date   Colon polyp 2008   3 mm   DJD (degenerative joint disease) of knee    Dr Maureen Ralphs   GERD (gastroesophageal reflux disease) 2008   Dr Carlean Purl   Hypertension    Past Surgical History:  Procedure Laterality Date   CYSTOSCOPY     X4 for microscopic hematuria   G 2  P 2     intraarticular steroid injection Right 2013 & 2014   Dr Maureen Ralphs   LAPAROSCOPY     For Infertility work up   Bartholomew  2008   Dr Milus Mallick TOOTH EXTRACTION      Current Outpatient Medications:    benazepril (LOTENSIN) 40 MG tablet, TAKE 1 TABLET DAILY, Disp: 90 tablet, Rfl: 3   Calcium Carbonate-Vitamin D (CALCIUM + D PO), Take by mouth daily., Disp: , Rfl:    Cholecalciferol (VITAMIN D-3) 1000 UNITS CAPS, Take by mouth daily., Disp: , Rfl:    gabapentin (NEURONTIN) 300 MG capsule, Take by mouth., Disp: , Rfl:    Omega-3 Fatty Acids (FISH OIL) 1200 MG CAPS, Take by mouth daily., Disp: , Rfl:    omeprazole (PRILOSEC) 40 MG capsule, Take 40 mg by mouth daily., Disp: , Rfl:    Turmeric (QC TUMERIC COMPLEX PO), Take by mouth., Disp: , Rfl:   No Known Allergies Review of Systems Objective:  There were no vitals filed for this visit.  General: Well developed, nourished, in no acute distress, alert and oriented x3   Dermatological: Skin is warm, dry and supple bilateral. Nails x 10 are well maintained; remaining integument appears unremarkable at this time. There are no open sores, no  preulcerative lesions, no rash or signs of infection present.  Vascular: Dorsalis Pedis artery and Posterior Tibial artery pedal pulses are 2/4 bilateral with immedate capillary fill time. Pedal hair growth present. No varicosities and no lower extremity edema present bilateral.   Neruologic: Grossly intact via light touch bilateral. Vibratory intact via tuning fork bilateral. Protective threshold with Semmes Wienstein monofilament intact to all pedal sites bilateral. Patellar and Achilles deep tendon reflexes 2+ bilateral. No Babinski or clonus noted bilateral.   Musculoskeletal: No gross boney pedal deformities bilateral. No pain, crepitus, or limitation noted with foot and ankle range of motion bilateral. Muscular strength 5/5 in all groups tested bilateral.  Moderate to severe hallux rigidus first metatarsophalangeal joint of the right foot with very mild bunion deformity.  Dorsal spurring is palpable.  She does have pain on end range of motion of the second metatarsophalangeal joint.  Gait: Unassisted, Nonantalgic.    Radiographs:  Radiographs taken today demonstrate osseously mature individual rectus foot type right significant osteoarthritic changes with a locked mild lateral deviation of the hallux on the head of the first metatarsal.  Joint space narrowing subchondral sclerosis small osteophyte intra-articular is noted.  She also has a dorsal osteophyte secondary to the hallux rigidus.  No visible  findings about the second metatarsal phalangeal joint.  Assessment & Plan:   Assessment: Capsulitis of the second metatarsophalangeal joint of the right foot most likely secondary to hallux rigidus.  Plan: At this point I injected around the second metatarsal phalangeal joint with 10 mg Kenalog 5 mg Marcaine.  We are going to have her scheduled for orthotics at which time we will put a salve to offload beneath the second met.     Camryn Quesinberry T. [place], [place]

## 2021-10-20 ENCOUNTER — Ambulatory Visit: Payer: Medicare HMO

## 2021-10-20 DIAGNOSIS — M2021 Hallux rigidus, right foot: Secondary | ICD-10-CM | POA: Diagnosis not present

## 2021-10-20 DIAGNOSIS — M778 Other enthesopathies, not elsewhere classified: Secondary | ICD-10-CM | POA: Diagnosis not present

## 2021-10-20 NOTE — Progress Notes (Unsigned)
Patient presents today to be casted for custom molded orthotics. Dr. Milinda Pointer has been treating patient for capsulitis.   Impression foam cast was taken. ABN signed.  Patient info-  Shoe size: 9  Shoe style: athletic  Height: 5'2  Weight: 140  Insurance: [employer]    Patient will be notified once orthotics arrive in office and reappoint for fitting at that time.

## 2021-11-26 ENCOUNTER — Telehealth: Payer: Self-pay | Admitting: Podiatry

## 2021-11-26 NOTE — Telephone Encounter (Signed)
Pt called had orthotics cast over a month ago and has left several messages for the orthotic dept and no one has called her back.  I went down and checked and they are not in. I told pt someone should call her when they come in.   She also asked about the billing as she got a statement from her insurance company stating they denied it and it said something about shoes and Dr Milinda Pointer told pt they sometimes pay part of the cost.I did check and she was billed for orthotics and I explained that they do not meet criteria to be covered that medicare requires them to be attached to a shoe and ours are not they are removable.

## 2021-11-29 NOTE — Telephone Encounter (Signed)
Pt scheduled for opu 12/01/21

## 2021-12-01 ENCOUNTER — Ambulatory Visit (INDEPENDENT_AMBULATORY_CARE_PROVIDER_SITE_OTHER): Payer: Medicare HMO

## 2021-12-01 DIAGNOSIS — M778 Other enthesopathies, not elsewhere classified: Secondary | ICD-10-CM

## 2021-12-01 NOTE — Progress Notes (Signed)
Patient presents today to pick up custom molded foot orthotics, diagnosed with capsulitis by Dr. Milinda Pointer.   Orthotics were dispensed and fit was satisfactory. Reviewed instructions for break-in and wear. Written instructions given to patient.  Patient will follow up as needed.   [facility] - order # [id number]

## 2021-12-28 ENCOUNTER — Encounter: Payer: Self-pay | Admitting: Internal Medicine

## 2021-12-28 NOTE — Progress Notes (Unsigned)
    Subjective:    Patient ID: Kendra Leonard, female    DOB: 08/08/1950, 71 y.o.   MRN: [id number]      HPI Kayzlee is here for  Chief Complaint  Patient presents with   Laryngitis         Medications and allergies reviewed with patient and updated if appropriate.  Current Outpatient Medications on File Prior to Visit  Medication Sig Dispense Refill   benazepril (LOTENSIN) 40 MG tablet TAKE 1 TABLET DAILY 90 tablet 3   Calcium Carbonate-Vitamin D (CALCIUM + D PO) Take by mouth daily.     Cholecalciferol (VITAMIN D-3) 1000 UNITS CAPS Take by mouth daily.     gabapentin (NEURONTIN) 300 MG capsule Take by mouth.     Omega-3 Fatty Acids (FISH OIL) 1200 MG CAPS Take by mouth daily.     omeprazole (PRILOSEC) 40 MG capsule Take 40 mg by mouth daily.     Turmeric (QC TUMERIC COMPLEX PO) Take by mouth.     No current facility-administered medications on file prior to visit.    Review of Systems     Objective:  There were no vitals filed for this visit. BP Readings from Last 3 Encounters:  06/07/21 110/74  11/25/20 118/78  06/05/20 130/72   Wt Readings from Last 3 Encounters:  06/07/21 148 lb (67.1 kg)  11/25/20 150 lb (68 kg)  06/05/20 150 lb (68 kg)   There is no height or weight on file to calculate BMI.    Physical Exam         Assessment & Plan:    See Problem List for Assessment and Plan of chronic medical problems.

## 2021-12-29 ENCOUNTER — Ambulatory Visit (INDEPENDENT_AMBULATORY_CARE_PROVIDER_SITE_OTHER): Payer: Medicare HMO | Admitting: Internal Medicine

## 2021-12-29 VITALS — BP 120/78 | HR 86 | Temp 98.0°F | Ht 64.0 in | Wt 140.0 lb

## 2021-12-29 DIAGNOSIS — J0100 Acute maxillary sinusitis, unspecified: Secondary | ICD-10-CM | POA: Diagnosis not present

## 2021-12-29 DIAGNOSIS — I10 Essential (primary) hypertension: Secondary | ICD-10-CM

## 2021-12-29 DIAGNOSIS — J329 Chronic sinusitis, unspecified: Secondary | ICD-10-CM | POA: Insufficient documentation

## 2021-12-29 MED ORDER — AMOXICILLIN-POT CLAVULANATE 875-125 MG PO TABS: 1.0000 | ORAL_TABLET | Freq: Two times a day (BID) | ORAL | 0 refills | Status: DC

## 2021-12-29 NOTE — Assessment & Plan Note (Signed)
Chronic Blood pressure well-controlled Continue benazepril 40 mg daily

## 2021-12-29 NOTE — Assessment & Plan Note (Signed)
Acute Likely bacterial  Start Augmentin 875-125 mg BID x 10 day otc cold medications Rest, fluid Call if no improvement  

## 2021-12-29 NOTE — Patient Instructions (Addendum)
      Medications changes include :   Augmentin twice daily for ten days     Return if symptoms worsen or fail to improve.

## 2022-02-11 ENCOUNTER — Other Ambulatory Visit: Payer: Self-pay | Admitting: Internal Medicine

## 2022-03-07 DIAGNOSIS — C541 Malignant neoplasm of endometrium: Secondary | ICD-10-CM | POA: Diagnosis not present

## 2022-03-15 ENCOUNTER — Telehealth: Payer: Self-pay | Admitting: Podiatry

## 2022-03-15 NOTE — Telephone Encounter (Signed)
    Patient states she never received any salve when she came in for the orthotics ?  Please advise this note is from her office visit?

## 2022-03-15 NOTE — Telephone Encounter (Signed)
It should have said "pad" not "salve"... its what is dictation machine thought he said.

## 2022-03-18 NOTE — Telephone Encounter (Signed)
Called patient giving information, verbalized understanding,requesting some type of spacer or something to put between the toes or to raise toes up off the shoe,please advise.

## 2022-03-21 NOTE — Telephone Encounter (Signed)
Patient has been updated.

## 2022-05-10 DIAGNOSIS — Z961 Presence of intraocular lens: Secondary | ICD-10-CM | POA: Diagnosis not present

## 2022-06-03 DIAGNOSIS — Z0100 Encounter for examination of eyes and vision without abnormal findings: Secondary | ICD-10-CM | POA: Diagnosis not present

## 2022-06-07 DIAGNOSIS — Z01419 Encounter for gynecological examination (general) (routine) without abnormal findings: Secondary | ICD-10-CM | POA: Diagnosis not present

## 2022-06-07 DIAGNOSIS — Z6825 Body mass index (BMI) 25.0-25.9, adult: Secondary | ICD-10-CM | POA: Diagnosis not present

## 2022-06-07 DIAGNOSIS — M8588 Other specified disorders of bone density and structure, other site: Secondary | ICD-10-CM | POA: Diagnosis not present

## 2022-06-07 LAB — HM DEXA SCAN

## 2022-06-08 NOTE — Patient Instructions (Addendum)
Blood work was ordered.   The lab is on the first floor.    Medications changes include :   None      Return in about 1 year (around 06/09/2023) for Physical Exam.   Health Maintenance, Female Adopting a healthy lifestyle and getting preventive care are important in promoting health and wellness. Ask your health care provider about: The right schedule for you to have regular tests and exams. Things you can do on your own to prevent diseases and keep yourself healthy. What should I know about diet, weight, and exercise? Eat a healthy diet  Eat a diet that includes plenty of vegetables, fruits, low-fat dairy products, and lean protein. Do not eat a lot of foods that are high in solid fats, added sugars, or sodium. Maintain a healthy weight Body mass index (BMI) is used to identify weight problems. It estimates body fat based on height and weight. Your health care provider can help determine your BMI and help you achieve or maintain a healthy weight. Get regular exercise Get regular exercise. This is one of the most important things you can do for your health. Most adults should: Exercise for at least 150 minutes each week. The exercise should increase your heart rate and make you sweat (moderate-intensity exercise). Do strengthening exercises at least twice a week. This is in addition to the moderate-intensity exercise. Spend less time sitting. Even light physical activity can be beneficial. Watch cholesterol and blood lipids Have your blood tested for lipids and cholesterol at 74 years of age, then have this test every 5 years. Have your cholesterol levels checked more often if: Your lipid or cholesterol levels are high. You are older than 74 years of age. You are at high risk for heart disease. What should I know about cancer screening? Depending on your health history and family history, you may need to have cancer screening at various ages. This may include screening  for: Breast cancer. Cervical cancer. Colorectal cancer. Skin cancer. Lung cancer. What should I know about heart disease, diabetes, and high blood pressure? Blood pressure and heart disease High blood pressure causes heart disease and increases the risk of stroke. This is more likely to develop in people who have high blood pressure readings or are overweight. Have your blood pressure checked: Every 3-5 years if you are 28-1 years of age. Every year if you are 69 years old or older. Diabetes Have regular diabetes screenings. This checks your fasting blood sugar level. Have the screening done: Once every three years after age 63 if you are at a normal weight and have a low risk for diabetes. More often and at a younger age if you are overweight or have a high risk for diabetes. What should I know about preventing infection? Hepatitis B If you have a higher risk for hepatitis B, you should be screened for this virus. Talk with your health care provider to find out if you are at risk for hepatitis B infection. Hepatitis C Testing is recommended for: Everyone born from [birth year] through 1965. Anyone with known risk factors for hepatitis C. Sexually transmitted infections (STIs) Get screened for STIs, including gonorrhea and chlamydia, if: You are sexually active and are younger than 74 years of age. You are older than 74 years of age and your health care provider tells you that you are at risk for this type of infection. Your sexual activity has changed since you were last screened, and you are  at increased risk for chlamydia or gonorrhea. Ask your health care provider if you are at risk. Ask your health care provider about whether you are at high risk for HIV. Your health care provider may recommend a prescription medicine to help prevent HIV infection. If you choose to take medicine to prevent HIV, you should first get tested for HIV. You should then be tested every 3 months for as long as you  are taking the medicine. Pregnancy If you are about to stop having your period (premenopausal) and you may become pregnant, seek counseling before you get pregnant. Take 400 to 800 micrograms (mcg) of folic acid every day if you become pregnant. Ask for birth control (contraception) if you want to prevent pregnancy. Osteoporosis and menopause Osteoporosis is a disease in which the bones lose minerals and strength with aging. This can result in bone fractures. If you are 71 years old or older, or if you are at risk for osteoporosis and fractures, ask your health care provider if you should: Be screened for bone loss. Take a calcium or vitamin D supplement to lower your risk of fractures. Be given hormone replacement therapy (HRT) to treat symptoms of menopause. Follow these instructions at home: Alcohol use Do not drink alcohol if: Your health care provider tells you not to drink. You are pregnant, may be pregnant, or are planning to become pregnant. If you drink alcohol: Limit how much you have to: 0-1 drink a day. Know how much alcohol is in your drink. In the [place] one drink equals one 12 oz bottle of beer (355 mL), one 5 oz glass of wine (148 mL), or one 1 oz glass of hard liquor (44 mL). Lifestyle Do not use any products that contain nicotine or tobacco. These products include cigarettes, chewing tobacco, and vaping devices, such as e-cigarettes. If you need help quitting, ask your health care provider. Do not use street drugs. Do not share needles. Ask your health care provider for help if you need support or information about quitting drugs. General instructions Schedule regular health, dental, and eye exams. Stay current with your vaccines. Tell your health care provider if: You often feel depressed. You have ever been abused or do not feel safe at home. Summary Adopting a healthy lifestyle and getting preventive care are important in promoting health and wellness. Follow your  health care provider's instructions about healthy diet, exercising, and getting tested or screened for diseases. Follow your health care provider's instructions on monitoring your cholesterol and blood pressure. This information is not intended to replace advice given to you by your health care provider. Make sure you discuss any questions you have with your health care provider. Document Revised: 06/08/2020 Document Reviewed: 06/08/2020 Elsevier Patient Education  [place].

## 2022-06-09 ENCOUNTER — Ambulatory Visit (INDEPENDENT_AMBULATORY_CARE_PROVIDER_SITE_OTHER): Payer: Medicare HMO | Admitting: Internal Medicine

## 2022-06-09 ENCOUNTER — Encounter: Payer: Self-pay | Admitting: Internal Medicine

## 2022-06-09 VITALS — BP 126/80 | HR 80 | Temp 98.0°F | Ht 64.0 in | Wt 141.0 lb

## 2022-06-09 DIAGNOSIS — G620 Drug-induced polyneuropathy: Secondary | ICD-10-CM

## 2022-06-09 DIAGNOSIS — K219 Gastro-esophageal reflux disease without esophagitis: Secondary | ICD-10-CM

## 2022-06-09 DIAGNOSIS — R7303 Prediabetes: Secondary | ICD-10-CM | POA: Diagnosis not present

## 2022-06-09 DIAGNOSIS — Z0000 Encounter for general adult medical examination without abnormal findings: Secondary | ICD-10-CM | POA: Diagnosis not present

## 2022-06-09 DIAGNOSIS — M85851 Other specified disorders of bone density and structure, right thigh: Secondary | ICD-10-CM | POA: Diagnosis not present

## 2022-06-09 DIAGNOSIS — M85852 Other specified disorders of bone density and structure, left thigh: Secondary | ICD-10-CM

## 2022-06-09 DIAGNOSIS — E7849 Other hyperlipidemia: Secondary | ICD-10-CM | POA: Diagnosis not present

## 2022-06-09 DIAGNOSIS — I10 Essential (primary) hypertension: Secondary | ICD-10-CM

## 2022-06-09 DIAGNOSIS — T451X5A Adverse effect of antineoplastic and immunosuppressive drugs, initial encounter: Secondary | ICD-10-CM | POA: Diagnosis not present

## 2022-06-09 LAB — CBC WITH DIFFERENTIAL/PLATELET
Basophils Absolute: 0 10*3/uL (ref 0.0–0.1)
Basophils Relative: 1 % (ref 0.0–3.0)
Eosinophils Absolute: 0.1 10*3/uL (ref 0.0–0.7)
Eosinophils Relative: 1.8 % (ref 0.0–5.0)
HCT: 37.6 % (ref 36.0–46.0)
Hemoglobin: 12.7 g/dL (ref 12.0–15.0)
Lymphocytes Relative: 31.3 % (ref 12.0–46.0)
Lymphs Abs: 1.5 10*3/uL (ref 0.7–4.0)
MCHC: 33.7 g/dL (ref 30.0–36.0)
MCV: 88.1 fl (ref 78.0–100.0)
Monocytes Absolute: 0.3 10*3/uL (ref 0.1–1.0)
Monocytes Relative: 6.7 % (ref 3.0–12.0)
Neutro Abs: 2.9 10*3/uL (ref 1.4–7.7)
Neutrophils Relative %: 59.2 % (ref 43.0–77.0)
Platelets: 235 10*3/uL (ref 150.0–400.0)
RBC: 4.27 Mil/uL (ref 3.87–5.11)
RDW: 13.1 % (ref 11.5–15.5)
WBC: 4.9 10*3/uL (ref 4.0–10.5)

## 2022-06-09 LAB — COMPREHENSIVE METABOLIC PANEL
ALT: 12 U/L (ref 0–35)
AST: 16 U/L (ref 0–37)
Albumin: 4.2 g/dL (ref 3.5–5.2)
Alkaline Phosphatase: 93 U/L (ref 39–117)
BUN: 17 mg/dL (ref 6–23)
CO2: 29 mEq/L (ref 19–32)
Calcium: 9.7 mg/dL (ref 8.4–10.5)
Chloride: 101 mEq/L (ref 96–112)
Creatinine, Ser: 0.75 mg/dL (ref 0.40–1.20)
GFR: 78.72 mL/min (ref >60.00)
Glucose, Bld: 100 mg/dL — ABNORMAL HIGH (ref 70–99)
Potassium: 4.5 mEq/L (ref 3.5–5.1)
Sodium: 137 mEq/L (ref 135–145)
Total Bilirubin: 0.6 mg/dL (ref 0.2–1.2)
Total Protein: 7.3 g/dL (ref 6.0–8.3)

## 2022-06-09 LAB — LIPID PANEL
Cholesterol: 188 mg/dL (ref 0–200)
HDL: 54.1 mg/dL (ref >39.00)
LDL Cholesterol: 114 mg/dL — ABNORMAL HIGH (ref 0–99)
NonHDL: 133.75
Total CHOL/HDL Ratio: 3
Triglycerides: 101 mg/dL (ref 0.0–149.0)
VLDL: 20.2 mg/dL (ref 0.0–40.0)

## 2022-06-09 LAB — HEMOGLOBIN A1C: Hgb A1c MFr Bld: 6.1 % (ref 4.6–6.5)

## 2022-06-09 LAB — TSH: TSH: 1.7 u[IU]/mL (ref 0.35–5.50)

## 2022-06-09 NOTE — Assessment & Plan Note (Addendum)
Chronic DEXA up-to-date - just had one done at [facility] Continue regular exercise Continue calcium and vitamin D daily
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
## 2022-06-09 NOTE — Assessment & Plan Note (Signed)
Chronic Blood pressure well-controlled CMP, CBC, lipid, TSH Continue benazepril 40 mg daily

## 2022-06-09 NOTE — Assessment & Plan Note (Signed)
Chronic ?Taking gabapentin ?

## 2022-06-09 NOTE — Assessment & Plan Note (Signed)
Chronic Check a1c Low sugar / carb diet Stressed regular exercise  

## 2022-06-09 NOTE — Assessment & Plan Note (Addendum)
Chronic GERD controlled Continue omeprazole 40 mg daily Mentioned omeprazole not good for her bones, but unfortunately she requires the medication

## 2022-06-09 NOTE — Assessment & Plan Note (Addendum)
Chronic Regular exercise and healthy diet encouraged Check lipid panel, CMP, TSH Discussed increased ASCVD risk Continue lifestyle control

## 2022-08-01 DIAGNOSIS — Z1231 Encounter for screening mammogram for malignant neoplasm of breast: Secondary | ICD-10-CM | POA: Diagnosis not present

## 2022-08-01 LAB — HM MAMMOGRAPHY

## 2022-08-09 DIAGNOSIS — R921 Mammographic calcification found on diagnostic imaging of breast: Secondary | ICD-10-CM | POA: Diagnosis not present

## 2022-08-09 DIAGNOSIS — R922 Inconclusive mammogram: Secondary | ICD-10-CM | POA: Diagnosis not present

## 2022-08-10 ENCOUNTER — Encounter: Payer: Self-pay | Admitting: Internal Medicine

## 2022-08-10 ENCOUNTER — Other Ambulatory Visit: Payer: Self-pay

## 2022-08-10 DIAGNOSIS — R921 Mammographic calcification found on diagnostic imaging of breast: Secondary | ICD-10-CM | POA: Diagnosis not present

## 2022-08-10 DIAGNOSIS — R922 Inconclusive mammogram: Secondary | ICD-10-CM | POA: Diagnosis not present

## 2022-08-10 DIAGNOSIS — N6021 Fibroadenosis of right breast: Secondary | ICD-10-CM | POA: Diagnosis not present

## 2022-08-18 ENCOUNTER — Encounter: Payer: Self-pay | Admitting: Internal Medicine

## 2022-09-05 DIAGNOSIS — C541 Malignant neoplasm of endometrium: Secondary | ICD-10-CM | POA: Diagnosis not present

## 2022-11-04 ENCOUNTER — Ambulatory Visit (INDEPENDENT_AMBULATORY_CARE_PROVIDER_SITE_OTHER): Payer: Medicare HMO | Admitting: Family Medicine

## 2022-11-04 ENCOUNTER — Encounter: Payer: Self-pay | Admitting: Family Medicine

## 2022-11-04 VITALS — BP 114/78 | HR 93 | Temp 97.6°F | Ht 64.0 in | Wt 145.0 lb

## 2022-11-04 DIAGNOSIS — N3001 Acute cystitis with hematuria: Secondary | ICD-10-CM

## 2022-11-04 DIAGNOSIS — K219 Gastro-esophageal reflux disease without esophagitis: Secondary | ICD-10-CM

## 2022-11-04 DIAGNOSIS — R300 Dysuria: Secondary | ICD-10-CM | POA: Diagnosis not present

## 2022-11-04 LAB — POC URINALSYSI DIPSTICK (AUTOMATED)
Bilirubin, UA: NEGATIVE
Blood, UA: POSITIVE
Glucose, UA: NEGATIVE
Ketones, UA: NEGATIVE
Nitrite, UA: NEGATIVE
Protein, UA: POSITIVE — AB
Spec Grav, UA: >=1.03 — AB (ref 1.010–1.025)
Urobilinogen, UA: 0.2 U/dL
pH, UA: 6 (ref 5.0–8.0)

## 2022-11-04 MED ORDER — NITROFURANTOIN MONOHYD MACRO 100 MG PO CAPS
100.0000 mg | ORAL_CAPSULE | Freq: Two times a day (BID) | ORAL | 0 refills | Status: DC
Start: 2022-11-04 — End: 2022-11-11

## 2022-11-04 NOTE — Progress Notes (Signed)
Subjective:  Kendra Leonard is a 72 y.o. female who complains of possible urinary tract infection.  She has had symptoms for 2 days.  Symptoms include  urinary frequency, urgency, burning and incomplete emptying. She also reports pelvic pressure . Patient denies fever, chills, abdominal pain, back pain, N/V/D.  C/o GERD symptoms. Taking PPI but having more breakthrough symptoms.   Past Medical History:  Diagnosis Date   Colon polyp 2008   3 mm   DJD (degenerative joint disease) of knee    Dr Despina Hick   GERD (gastroesophageal reflux disease) 2008   Dr Leone Payor   Hypertension     ROS as in subjective  Reviewed allergies, medications, past medical, surgical, and social history.    Objective: Vitals:   11/04/22 1308  BP: 114/78  Pulse: 93  Temp: 97.6 F (36.4 C)  SpO2: 99%    General appearance: alert, no distress, WD/WN, female Abdomen: +bs, soft, non tender, non distended Back: no CVA tenderness     Laboratory:  Urine dipstick: 3+ for hemoglobin, trace for leukocyte esterase, and negative for nitrites.       Assessment: Acute cystitis with hematuria - Plan: Urine Culture, nitrofurantoin, macrocrystal-monohydrate, (MACROBID) 100 MG capsule  Dysuria - Plan: POCT Urinalysis Dipstick (Automated)  Gastroesophageal reflux disease without esophagitis    Plan: Discussed symptoms, diagnosis, possible complications, and usual course of illness.  Macrobid prescribed. Advised increased water intake, can use OTC Tylenol for pain.  May take AZO otc. Advised to follow up with PCP or GI due to persistent GERD and breakthrough on PPI.   Urine culture sent.   Call or return if worse or not improving.

## 2022-11-04 NOTE — Patient Instructions (Signed)
Take the antibiotic as prescribed.  Continue drinking plenty of water.   You may take over the counter AZO if needed for 1-2 days and then stop.   Follow up if worsening or if you are not improving in the next 2-3 days.

## 2022-11-05 LAB — URINE CULTURE: Result:: NO GROWTH

## 2022-11-11 ENCOUNTER — Ambulatory Visit (INDEPENDENT_AMBULATORY_CARE_PROVIDER_SITE_OTHER): Payer: Medicare HMO | Admitting: Internal Medicine

## 2022-11-11 ENCOUNTER — Encounter: Payer: Self-pay | Admitting: Internal Medicine

## 2022-11-11 VITALS — BP 118/80 | HR 62 | Temp 98.1°F | Ht 64.0 in | Wt 148.0 lb

## 2022-11-11 DIAGNOSIS — N3001 Acute cystitis with hematuria: Secondary | ICD-10-CM

## 2022-11-11 DIAGNOSIS — R300 Dysuria: Secondary | ICD-10-CM

## 2022-11-11 DIAGNOSIS — N3000 Acute cystitis without hematuria: Secondary | ICD-10-CM | POA: Insufficient documentation

## 2022-11-11 LAB — POC URINALSYSI DIPSTICK (AUTOMATED)
Bilirubin, UA: NEGATIVE
Glucose, UA: NEGATIVE
Ketones, UA: NEGATIVE
Nitrite, UA: NEGATIVE
Protein, UA: NEGATIVE
Spec Grav, UA: 1.02 (ref 1.010–1.025)
Urobilinogen, UA: 0.2 U/dL
pH, UA: 6 (ref 5.0–8.0)

## 2022-11-11 MED ORDER — CEPHALEXIN 500 MG PO CAPS: 500.0000 mg | ORAL_CAPSULE | Freq: Two times a day (BID) | ORAL | 0 refills | Status: DC

## 2022-11-11 MED ORDER — FLUCONAZOLE 150 MG PO TABS: 150.0000 mg | ORAL_TABLET | Freq: Once | ORAL | 0 refills | Status: AC

## 2022-11-11 NOTE — Patient Instructions (Addendum)
       Medications changes include :  keflex twice daily and fluconazole if needed for possible yeast infection     Return if symptoms worsen or fail to improve.  If your symptoms are not completely resolved please let me know.

## 2022-11-11 NOTE — Assessment & Plan Note (Addendum)
Subacute Recently seen 10/4 for UTI symptoms-UA was positive and started on Macrobid which she did complete, urine culture was negative for growth Symptoms improved, but did not resolve completely Urine dip today consistent with possible UTI She is going on a 11-day trip in 2 days so I we will start her on Keflex 500 mg twice daily x 7 days, Diflucan 150 mg x 1 if needed after completing the antibiotics Will send urine for culture Her symptoms are little atypical for UTI and I am concerned there may be some other cause for her symptoms.  If her symptoms have not completely resolved and/or her culture is negative we will do further evaluation because of her history

## 2022-11-11 NOTE — Progress Notes (Signed)
Subjective:    Patient ID: Kendra Leonard, female    DOB: Oct 10, 1950, 72 y.o.   MRN: [id number]      HPI Kendra Leonard is here for  Chief Complaint  Patient presents with   Urinary Tract Infection    Still doesn't feel like UTI has cleared up all the way and she is getting ready to go on vacation for 14 days. Bloating, some pelvic discomfort   10/4 - UTI symptoms - UA positive, started macrobid - urine cx no growth.  Symptoms improved but never completely went away  ? UTI:  Her symptoms started > 1 week ago.  She states back pain, vaginal discomfort, dysuria on occasion, a little nausea this morning and discomfort in the lower abdomen   She denies vulvar itch, vaginal discharge,hematuria, urinary frequency, urinary urgency, fever.       Medications and allergies reviewed with patient and updated if appropriate.  Current Outpatient Medications on File Prior to Visit  Medication Sig Dispense Refill   benazepril (LOTENSIN) 40 MG tablet TAKE 1 TABLET DAILY 90 tablet 3   Calcium Carbonate-Vitamin D (CALCIUM + D PO) Take by mouth daily.     Cholecalciferol (VITAMIN D-3) 1000 UNITS CAPS Take by mouth daily.     gabapentin (NEURONTIN) 300 MG capsule Take by mouth.     nitrofurantoin, macrocrystal-monohydrate, (MACROBID) 100 MG capsule Take 1 capsule (100 mg total) by mouth 2 (two) times daily. 10 capsule 0   Omega-3 Fatty Acids (FISH OIL) 1200 MG CAPS Take by mouth daily.     omeprazole (PRILOSEC) 40 MG capsule Take 40 mg by mouth daily.     Turmeric (QC TUMERIC COMPLEX PO) Take by mouth.     No current facility-administered medications on file prior to visit.    Review of Systems     Objective:   Vitals:   11/11/22 1443  Pulse: 62  Temp: 98.1 F (36.7 C)  SpO2: 96%   BP Readings from Last 3 Encounters:  11/04/22 114/78  06/09/22 126/80  12/29/21 120/78   Wt Readings from Last 3 Encounters:  11/11/22 148 lb (67.1 kg)  11/04/22 145 lb (65.8 kg)  06/09/22 141 lb (64 kg)    Body mass index is 25.4 kg/m.    Physical Exam Constitutional:      General: She is not in acute distress.    Appearance: Normal appearance. She is not ill-appearing.  HENT:     Head: Normocephalic.  Eyes:     Conjunctiva/sclera: Conjunctivae normal.  Abdominal:     General: There is distension.     Palpations: Abdomen is soft.     Tenderness: There is abdominal tenderness (Minimal across upper abdomen and across the lower abdomen). There is no right CVA tenderness, left CVA tenderness, guarding or rebound.  Skin:    General: Skin is warm and dry.  Neurological:     Mental Status: She is alert.            Assessment & Plan:    See Problem List for Assessment and Plan of chronic medical problems.

## 2022-11-13 LAB — CULTURE, URINE COMPREHENSIVE

## 2022-12-08 ENCOUNTER — Telehealth: Payer: Self-pay | Admitting: Internal Medicine

## 2022-12-08 NOTE — Telephone Encounter (Signed)
Please call her-we saw her almost a month ago for possible UTI.  She was treated, but culture was negative.  If her symptoms have not completely resolved I would like to see her again.

## 2022-12-08 NOTE — Telephone Encounter (Signed)
Noted  

## 2023-01-06 ENCOUNTER — Other Ambulatory Visit: Payer: Self-pay | Admitting: Internal Medicine

## 2023-02-01 HISTORY — PX: REVISION TOTAL HIP ARTHROPLASTY: SHX766

## 2023-02-02 DIAGNOSIS — M25561 Pain in right knee: Secondary | ICD-10-CM | POA: Diagnosis not present

## 2023-02-02 DIAGNOSIS — M25552 Pain in left hip: Secondary | ICD-10-CM | POA: Diagnosis not present

## 2023-02-02 DIAGNOSIS — M25562 Pain in left knee: Secondary | ICD-10-CM | POA: Diagnosis not present

## 2023-02-08 DIAGNOSIS — M25552 Pain in left hip: Secondary | ICD-10-CM | POA: Diagnosis not present

## 2023-02-24 DIAGNOSIS — M1612 Unilateral primary osteoarthritis, left hip: Secondary | ICD-10-CM | POA: Diagnosis not present

## 2023-03-06 DIAGNOSIS — C541 Malignant neoplasm of endometrium: Secondary | ICD-10-CM | POA: Diagnosis not present

## 2023-03-18 DIAGNOSIS — M25552 Pain in left hip: Secondary | ICD-10-CM | POA: Diagnosis not present

## 2023-03-29 DIAGNOSIS — M1612 Unilateral primary osteoarthritis, left hip: Secondary | ICD-10-CM | POA: Diagnosis not present

## 2023-03-30 ENCOUNTER — Ambulatory Visit: Payer: Self-pay | Admitting: Internal Medicine

## 2023-03-30 NOTE — Telephone Encounter (Signed)
 Copied from CRM [id number]. Topic: Clinical - Red Word Triage >> Mar 30, 2023 12:27 PM Sim Boast F wrote: Red Word that prompted transfer to Nurse Triage: Patient says she has laryngitis, chest hurts and has congestion/cough   Chief Complaint: Cough Symptoms: Cough, chest pressure, hoarse voice  Frequency: Frequent cough  Disposition: [x] ED /[] Urgent Care (no appt availability in office) / [] Appointment(In office/virtual)/ []  [facility]/ [] Home Care/ [x] Refused Recommended Disposition /[] [facility]/ []  Follow-up with PCP Additional Notes: Patient reports a cough that began 2 days ago. She states that with her cough she is also experiencing a hoarse voice and chest pressure. She states the chest pressure is constant and is across her entire chest. Patient advised that with her chest pressure she should go to the ED for evaluation. Patient reports she is not going to the ED and has requested to speak with someone at the office. Patient warm transferred to the [facility].     Reason for Disposition  Chest pain  (Exception: MILD central chest pain, present only when coughing.)  Answer Assessment - Initial Assessment Questions 1. ONSET: "When did the cough begin?"      2 days ago 2. SEVERITY: "How bad is the cough today?"      Mild 3. SPUTUM: "Describe the color of your sputum" (none, dry cough; clear, white, yellow, green)     None 4. HEMOPTYSIS: "Are you coughing up any blood?" If so ask: "How much?" (flecks, streaks, tablespoons, etc.)     No 5. DIFFICULTY BREATHING: "Are you having difficulty breathing?" If Yes, ask: "How bad is it?" (e.g., mild, moderate, severe)    - MILD: No SOB at rest, mild SOB with walking, speaks normally in sentences, can lie down, no retractions, pulse < 100.    - MODERATE: SOB at rest, SOB with minimal exertion and prefers to sit, cannot lie down flat, speaks in phrases, mild retractions, audible wheezing, pulse 100-120.    - SEVERE: Very SOB at  rest, speaks in single words, struggling to breathe, sitting hunched forward, retractions, pulse > 120      No 6. FEVER: "Do you have a fever?" If Yes, ask: "What is your temperature, how was it measured, and when did it start?"     No 7. CARDIAC HISTORY: "Do you have any history of heart disease?" (e.g., heart attack, congestive heart failure)      No 8. LUNG HISTORY: "Do you have any history of lung disease?"  (e.g., pulmonary embolus, asthma, emphysema)     No 9. PE RISK FACTORS: "Do you have a history of blood clots?" (or: recent major surgery, recent prolonged travel, bedridden)     No 10. OTHER SYMPTOMS: "Do you have any other symptoms?" (e.g., runny nose, wheezing, chest pain)       Chest heaviness, hoarse voice 11. PREGNANCY: "Is there any chance you are pregnant?" "When was your last menstrual period?"       No 12. TRAVEL: "Have you traveled out of the country in the last month?" (e.g., travel history, exposures)       No  Protocols used: Cough - Acute Non-Productive-A-AH

## 2023-03-31 ENCOUNTER — Ambulatory Visit: Payer: Medicare HMO | Admitting: Family

## 2023-03-31 VITALS — BP 133/85 | HR 80 | Temp 99.0°F | Resp 18 | Ht 62.0 in | Wt 143.2 lb

## 2023-03-31 DIAGNOSIS — R059 Cough, unspecified: Secondary | ICD-10-CM | POA: Diagnosis not present

## 2023-03-31 DIAGNOSIS — R197 Diarrhea, unspecified: Secondary | ICD-10-CM

## 2023-03-31 DIAGNOSIS — R0789 Other chest pain: Secondary | ICD-10-CM | POA: Insufficient documentation

## 2023-03-31 DIAGNOSIS — R6889 Other general symptoms and signs: Secondary | ICD-10-CM

## 2023-03-31 DIAGNOSIS — Z1152 Encounter for screening for COVID-19: Secondary | ICD-10-CM

## 2023-03-31 DIAGNOSIS — J069 Acute upper respiratory infection, unspecified: Secondary | ICD-10-CM

## 2023-03-31 LAB — POCT INFLUENZA A/B
Influenza A, POC: NEGATIVE
Influenza B, POC: NEGATIVE

## 2023-03-31 LAB — POC COVID19 BINAXNOW: SARS Coronavirus 2 Ag: NEGATIVE

## 2023-03-31 NOTE — Patient Instructions (Signed)
 VISIT SUMMARY:  Today, we discussed your symptoms of a viral illness, including runny nose, diarrhea, fatigue, and chest heaviness. We also reviewed your general health maintenance in preparation for your upcoming hip replacement surgery.  YOUR PLAN:  -UPPER RESPIRATORY INFECTION: An upper respiratory infection is a common viral illness that affects the nose, throat, and airways. Your flu swab and covid swabs were negative.  Please rest, stay hydrated, and use over-the-counter cold remedies to manage your symptoms.

## 2023-03-31 NOTE — Progress Notes (Signed)
 Subjective:     Patient ID: Kendra Leonard, female    DOB: 05-01-1950, 73 y.o.   MRN: [id number]  No chief complaint on file.   HPI  Discussed the use of AI scribe software for clinical note transcription with the patient, who gave verbal consent to proceed.  History of Present Illness  Kendra Leonard is a 73 year old female who presents with symptoms of a viral illness.   She has been experiencing symptoms consistent with a viral illness since Wednesday morning (2/26), including rhinorrhea, diarrhea, and fatigue. She has been consuming chicken noodle soup and liquids but has a decreased appetite. Her daughter noted hoarseness on Tuesday night, and she started feeling unwell the following day. She experiences chills at times and notes increased chest heaviness with colds compared to the past. Her husband has also developed a cold, characterized by a dry cough.  On Thursday, she experienced chest pressure, which she attributes to congestion from coughing and the cold. The chest pressure was more pronounced yesterday but has lessened today.   She has a history of pneumonia and is concerned about the possibility of developing it again. She is up to date on her influenza vaccination but is unsure about the latest COVID-19 booster, having received three doses previously. She is worried about being ill for an upcoming trip to [place] on Tuesday.     Health Maintenance Due  Topic Date Due   Medicare Annual Wellness (AWV)  Never done   Zoster Vaccines- Shingrix (1 of 2) 07/19/1967   INFLUENZA VACCINE  09/01/2022   COVID-19 Vaccine (5 - 2024-25 season) 10/02/2022    Past Medical History:  Diagnosis Date   Colon polyp 2008   3 mm   DJD (degenerative joint disease) of knee    Dr Despina Hick   GERD (gastroesophageal reflux disease) 2008   Dr Leone Payor   Hypertension     Past Surgical History:  Procedure Laterality Date   CYSTOSCOPY     X4 for microscopic hematuria   G 2  P 2      intraarticular steroid injection Right 2013 & 2014   Dr Despina Hick   LAPAROSCOPY     For Infertility work up   UPPER GI ENDOSCOPY  2008   Dr Leone Payor   WISDOM TOOTH EXTRACTION      Family History  Problem Relation Age of Onset   Prostate cancer Father 35   Hypertension Father    Aneurysm Father        Thoracic Aneurysm   Dysrhythmia Father    GER disease Father        PMH DUD   Dementia Father    Parkinsonism Father    Uterine cancer Mother 68   Hypertension Mother    Other Mother        colovaginal fistula   Hypertension Brother    Diabetes Maternal Aunt    Diabetes Maternal Uncle    Tremor Maternal Aunt        2 aunts ( no FH Parkinson's)   Stroke Maternal Grandfather        >55   GER disease Brother    Colon cancer Neg Hx    Colon polyps Neg Hx    Rectal cancer Neg Hx    Stomach cancer Neg Hx     Social History   Socioeconomic History   Marital status: Married    Spouse name: Not on file   Number of children: Not on file  Years of education: Not on file   Highest education level: Not on file  Occupational History   Not on file  Tobacco Use   Smoking status: Never   Smokeless tobacco: Never  Vaping Use   Vaping status: Never Used  Substance and Sexual Activity   Alcohol use: Yes    Comment: Socially    Drug use: No   Sexual activity: Not on file  Other Topics Concern   Not on file  Social History Narrative   Walking regularly for exercise   Social Drivers of Health   Financial Resource Strain: Not on file  Food Insecurity: Not on file  Transportation Needs: Not on file  Physical Activity: Not on file  Stress: Not on file  Social Connections: Not on file  Intimate Partner Violence: Not on file    Outpatient Medications Prior to Visit  Medication Sig Dispense Refill   benazepril (LOTENSIN) 40 MG tablet TAKE 1 TABLET DAILY 90 tablet 3   Calcium Carbonate-Vitamin D (CALCIUM + D PO) Take by mouth daily.     Cholecalciferol (VITAMIN D-3) 1000  UNITS CAPS Take by mouth daily.     gabapentin (NEURONTIN) 300 MG capsule Take by mouth.     Omega-3 Fatty Acids (FISH OIL) 1200 MG CAPS Take by mouth daily.     omeprazole (PRILOSEC) 40 MG capsule Take 40 mg by mouth daily.     Turmeric (QC TUMERIC COMPLEX PO) Take by mouth.     cephALEXin (KEFLEX) 500 MG capsule Take 1 capsule (500 mg total) by mouth 2 (two) times daily. (Patient not taking: Reported on 03/31/2023) 14 capsule 0   No facility-administered medications prior to visit.    No Known Allergies  ROS    See HPI Objective:    Physical Exam Constitutional:      General: She is not in acute distress.    Appearance: Normal appearance. She is well-developed.  HENT:     Head: Normocephalic and atraumatic.     Comments: Small blister noted left soft palate    Right Ear: Tympanic membrane, ear canal and external ear normal.     Left Ear: Tympanic membrane, ear canal and external ear normal.     Mouth/Throat:     Pharynx: No pharyngeal swelling or posterior oropharyngeal erythema.     Tonsils: No tonsillar exudate.  Eyes:     General: No scleral icterus. Neck:     Thyroid: No thyromegaly.  Cardiovascular:     Rate and Rhythm: Normal rate and regular rhythm.     Heart sounds: Normal heart sounds. No murmur heard. Pulmonary:     Effort: Pulmonary effort is normal. No respiratory distress.     Breath sounds: Normal breath sounds. No wheezing.  Musculoskeletal:     Cervical back: Neck supple.  Lymphadenopathy:     Cervical: No cervical adenopathy.  Skin:    General: Skin is warm and dry.  Neurological:     Mental Status: She is alert and oriented to person, place, and time.  Psychiatric:        Mood and Affect: Mood normal.        Behavior: Behavior normal.        Thought Content: Thought content normal.        Judgment: Judgment normal.      BP 133/85 (BP Location: Right Arm, Patient Position: Sitting, Cuff Size: Normal)   Pulse 80   Temp 99 F (37.2 C) (Oral)    Resp 18  Ht 5\' 2"  (1.575 m)   Wt 143 lb 3.2 oz (65 kg)   SpO2 100%   BMI 26.19 kg/m  Wt Readings from Last 3 Encounters:  03/31/23 143 lb 3.2 oz (65 kg)  11/11/22 148 lb (67.1 kg)  11/04/22 145 lb (65.8 kg)       Assessment & Plan:   Problem List Items Addressed This Visit       Unprioritized   Viral URI   New. Flu and covid swabs are negative.   Recommended supportive care: tylenol, fluids, rest. Declines rx for cough. Advised pt to let us know if new/worsening symptoms or if she does not note improvement by Monday.       Atypical chest pain - Primary   EKG tracing is personally reviewed.  EKG notes NSR.  No acute changes.   Chest discomfort is much better today. I do think it was related to her viral illness.       Relevant Orders   EKG 12-Lead (Completed)    I have discontinued Langley Adie. [place]field's cephALEXin. I am also having her maintain her Fish Oil, Calcium Carbonate-Vitamin D (CALCIUM + D PO), Vitamin D-3, gabapentin, Turmeric (QC TUMERIC COMPLEX PO), omeprazole, and benazepril.  No orders of the defined types were placed in this encounter.

## 2023-03-31 NOTE — Assessment & Plan Note (Signed)
 EKG tracing is personally reviewed.  EKG notes NSR.  No acute changes.   Chest discomfort is much better today. I do think it was related to her viral illness.

## 2023-03-31 NOTE — Assessment & Plan Note (Signed)
 New. Flu and covid swabs are negative.   Recommended supportive care: tylenol, fluids, rest. Declines rx for cough. Advised pt to let us know if new/worsening symptoms or if she does not note improvement by Monday.

## 2023-03-31 NOTE — Addendum Note (Signed)
 Addended by: Marian Sorrow D on: 03/31/2023 01:26 PM   Modules accepted: Orders

## 2023-04-24 ENCOUNTER — Telehealth: Payer: Self-pay | Admitting: Internal Medicine

## 2023-04-24 NOTE — Telephone Encounter (Unsigned)
 Copied from CRM [id number]. Topic: General - Other >> Apr 24, 2023 10:39 AM Alcus Dad wrote: Reason for CRM: Patient is wanting Dr. Lawerance Bach to OK her surgery

## 2023-04-26 NOTE — Telephone Encounter (Signed)
 Made pt aware that she will need an office visit for surgical clearance, pt is schedule on 05/04/23 @ 9:10 am

## 2023-04-26 NOTE — Telephone Encounter (Signed)
 Copied from CRM [id number]. Topic: General - Call Back - No Documentation >> Apr 26, 2023  9:00 AM Cammy Copa D wrote: Reason for CRM: Patient calling back regarding voicemail left from office regarding paper work, possibly for upcoming surgery on 4/21 - Pt states that she does not want to come in to fill out paper work if she doesn't have to, also states she was supposed to receive a call back Monday but didn't. Pt is fine with receiving a message via MyChart if not a phone call from the clinic.

## 2023-04-30 ENCOUNTER — Encounter: Payer: Self-pay | Admitting: Internal Medicine

## 2023-04-30 NOTE — Progress Notes (Unsigned)
      Subjective:    Patient ID: Servando Snare, female    DOB: 19-Feb-1950, 73 y.o.   MRN: [id number]     HPI Kenae is here for pre-operative clearance at the request of Dr Lequita Halt for Left total hip replacement scheduled for 05/22/2023.   Sonyia denies any personal or family history of problems with anesthesia or bleeding/blood clot problems.    Yitta has no concerns and is taking all prescribed medication as prescribed.   Estefana is exercising regularly.  With their daily activities they denies chest pain, palpitations, SOB and lightheadedness.     Medications and allergies reviewed with patient and updated if appropriate.  Current Outpatient Medications on File Prior to Visit  Medication Sig Dispense Refill   benazepril (LOTENSIN) 40 MG tablet TAKE 1 TABLET DAILY 90 tablet 3   Calcium Carbonate-Vitamin D (CALCIUM + D PO) Take by mouth daily.     Cholecalciferol (VITAMIN D-3) 1000 UNITS CAPS Take by mouth daily.     gabapentin (NEURONTIN) 300 MG capsule Take by mouth.     Omega-3 Fatty Acids (FISH OIL) 1200 MG CAPS Take by mouth daily.     omeprazole (PRILOSEC) 40 MG capsule Take 40 mg by mouth daily.     Turmeric (QC TUMERIC COMPLEX PO) Take by mouth.     No current facility-administered medications on file prior to visit.     Review of Systems     Objective:  There were no vitals filed for this visit. BP Readings from Last 3 Encounters:  03/31/23 133/85  11/11/22 118/80  11/04/22 114/78   Wt Readings from Last 3 Encounters:  03/31/23 143 lb 3.2 oz (65 kg)  11/11/22 148 lb (67.1 kg)  11/04/22 145 lb (65.8 kg)   There is no height or weight on file to calculate BMI.    Physical Exam     Lab Results  Component Value Date   WBC 4.9 06/09/2022   HGB 12.7 06/09/2022   HCT 37.6 06/09/2022   PLT 235.0 06/09/2022   GLUCOSE 100 (H) 06/09/2022   CHOL 188 06/09/2022   TRIG 101.0 06/09/2022   HDL 54.10 06/09/2022   LDLCALC 114 (H) 06/09/2022   ALT 12 06/09/2022    AST 16 06/09/2022   NA 137 06/09/2022   K 4.5 06/09/2022   CL 101 06/09/2022   CREATININE 0.75 06/09/2022   BUN 17 06/09/2022   CO2 29 06/09/2022   TSH 1.70 06/09/2022   HGBA1C 6.1 06/09/2022     Assessment & Plan:    See Problem List for Assessment and Plan of chronic medical problems.

## 2023-04-30 NOTE — Patient Instructions (Addendum)
      Blood work was ordered.       Medications changes include :   None     Return for follow up as scheduled.

## 2023-05-01 DIAGNOSIS — Z961 Presence of intraocular lens: Secondary | ICD-10-CM | POA: Diagnosis not present

## 2023-05-01 DIAGNOSIS — H5213 Myopia, bilateral: Secondary | ICD-10-CM | POA: Diagnosis not present

## 2023-05-01 DIAGNOSIS — H02403 Unspecified ptosis of bilateral eyelids: Secondary | ICD-10-CM | POA: Diagnosis not present

## 2023-05-02 NOTE — H&P (Signed)
 TOTAL HIP ADMISSION H&P  Patient is admitted for left total hip arthroplasty.  Subjective:  Chief Complaint: Left hip pain  HPI: Kendra Leonard, 73 y.o. female, has a history of pain and functional disability in the left hip due to arthritis and patient has failed non-surgical conservative treatments for greater than 12 weeks to include NSAID's and/or analgesics, corticosteriod injections, flexibility and strengthening excercises, and activity modification. Onset of symptoms was gradual, starting  several  years ago with gradually worsening course since that time. The patient noted no past surgery on the left hip. Patient currently rates pain in the left hip at 7 out of 10 with activity. Patient has night pain, worsening of pain with activity and weight bearing, and trendelenberg gait. Patient has evidence of  rapidly progressive osteoarthritis. There is a large effusion, edematous changes in the femoral head with subchondral cysts, and edematous changes in the acetabulum  by imaging studies. This condition presents safety issues increasing the risk of falls. There is no current active infection.  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/04/2018   Right shoulder pain 06/04/2018   Snoring 06/04/2018   History of endometrial cancer, 2019, adenocarcinoma 11/08/2017   Osteopenia 06/07/2016   Prediabetes 06/07/2016   Viral URI 02/16/2016   Irritable bowel syndrome (IBS) 11/03/2014   Hematuria, microscopic 04/27/2011   DIVERTICULITIS, HX OF 05/28/2009   POPLITEAL CYST, RIGHT 04/22/2008   History of colonic polyps 01/28/2008   Essential hypertension 01/26/2007   EROSIVE ESOPHAGITIS 12/22/2006   Diverticulosis of colon 12/22/2006   GERD 10/24/2006   Migraine without aura 06/27/2006    Past Medical  History:  Diagnosis Date   Colon polyp 2008   3 mm   DJD (degenerative joint disease) of knee    Dr Despina Hick   GERD (gastroesophageal reflux disease) 2008   Dr Leone Payor   Hypertension     Past Surgical History:  Procedure Laterality Date   CYSTOSCOPY     X4 for microscopic hematuria   G 2  P 2     intraarticular steroid injection Right 2013 & 2014   Dr Despina Hick   LAPAROSCOPY     For Infertility work up   UPPER GI ENDOSCOPY  2008   Dr Roxan Diesel TOOTH EXTRACTION      Prior to Admission medications   Medication Sig Start Date End Date Taking? Authorizing Provider  benazepril (LOTENSIN) 40 MG tablet TAKE 1 TABLET DAILY 01/06/23   Pincus Sanes, MD  Calcium Carbonate-Vitamin D (CALCIUM + D PO) Take by mouth daily.    [provider]  Cholecalciferol (VITAMIN D-3) 1000 UNITS CAPS Take by mouth daily.    [provider]  gabapentin (NEURONTIN) 300 MG capsule Take by mouth. 12/25/17   [provider]  Omega-3 Fatty Acids (FISH OIL) 1200 MG CAPS Take by mouth daily.    [provider]  omeprazole (PRILOSEC) 40 MG capsule Take 40 mg by mouth daily. 08/30/21   [provider]  Turmeric (QC TUMERIC COMPLEX PO) Take by mouth.    [provider]    No Known Allergies  Social History   Socioeconomic History   Marital status: Married    Spouse name: Not on file   Number of children: Not on file   Years of  education: Not on file   Highest education level: Not on file  Occupational History   Not on file  Tobacco Use   Smoking status: Never   Smokeless tobacco: Never  Vaping Use   Vaping status: Never Used  Substance and Sexual Activity   Alcohol use: Yes    Comment: Socially    Drug use: No   Sexual activity: Not on file  Other Topics Concern   Not on file  Social History Narrative   Walking regularly for exercise   Social Drivers of Health   Financial Resource Strain: Not on file  Food Insecurity: Not on file   Transportation Needs: Not on file  Physical Activity: Not on file  Stress: Not on file  Social Connections: Not on file  Intimate Partner Violence: Not on file    Tobacco Use: Low Risk  (04/30/2023)   Patient History    Smoking Tobacco Use: Never    Smokeless Tobacco Use: Never    Passive Exposure: Not on file   Social History   Substance and Sexual Activity  Alcohol Use Yes   Comment: Socially     Family History  Problem Relation Age of Onset   Prostate cancer Father 36   Hypertension Father    Aneurysm Father        Thoracic Aneurysm   Dysrhythmia Father    GER disease Father        PMH DUD   Dementia Father    Parkinsonism Father    Uterine cancer Mother 64   Hypertension Mother    Other Mother        colovaginal fistula   Hypertension Brother    Diabetes Maternal Aunt    Diabetes Maternal Uncle    Tremor Maternal Aunt        2 aunts ( no FH Parkinson's)   Stroke Maternal Grandfather        >55   GER disease Brother    Colon cancer Neg Hx    Colon polyps Neg Hx    Rectal cancer Neg Hx    Stomach cancer Neg Hx     ROS   Objective:  Physical Exam: Well nourished and well developed.  General: Alert and oriented x3, cooperative and pleasant, no acute distress.  Head: normocephalic, atraumatic, neck supple.  Eyes: EOMI. Abdomen: non-tender to palpation and soft, normoactive bowel sounds. Musculoskeletal: - Left hip flexed to approximately 100 degrees.  - Minimal internal rotation.  - 20 degrees of external rotation.  - 20 degrees of abduction.  - Nontender over the greater trochanter.  - Antalgic gait pattern on the left. Calves soft and nontender. Motor function intact in LE. Strength 5/5 LE bilaterally. Neuro: Distal pulses 2+. Sensation to light touch intact in LE.  Vital signs in last 24 hours: BP: ()/()  Arterial Line BP: ()/()   Imaging Review Plain radiographs demonstrate severe degenerative joint disease of the left hip. The bone  quality appears to be adequate for age and reported activity level.  Assessment/Plan:  End stage arthritis, left hip  The patient history, physical examination, clinical judgement of the provider and imaging studies are consistent with end stage degenerative joint disease of the left hip and total hip arthroplasty is deemed medically necessary. The treatment options including medical management, injection therapy, arthroscopy and arthroplasty were discussed at length. The risks and benefits of total hip arthroplasty were presented and reviewed. The risks due to aseptic loosening, infection, stiffness, dislocation/subluxation, thromboembolic complications and other  imponderables were discussed. The patient acknowledged the explanation, agreed to proceed with the plan and consent was signed. Patient is being admitted for inpatient treatment for surgery, pain control, PT, OT, prophylactic antibiotics, VTE prophylaxis, progressive ambulation and ADLs and discharge planning.The patient is planning to be discharged  home .  Therapy Plans: HEP Disposition: Home with Husband Planned DVT Prophylaxis: Xarelto 10 mg (hx of uterine cancer) DME Needed: RW PCP: Cheryll Cockayne, MD (appt 04/03) TXA: IV Allergies: erythromycin (GI upset) Anesthesia Concerns: None BMI: 25.7 Last HgbA1c: not diabetic  Pharmacy: [facility], [place])  Other: -14 steps to bedroom  - Patient was instructed on what medications to stop prior to surgery. - Follow-up visit in 2 weeks with Dr. Lequita Halt - Begin physical therapy following surgery - Pre-operative lab work as pre-surgical testing - Prescriptions will be provided in hospital at time of discharge  R. Arcola Jansky, PA-C Orthopedic Surgery [facility] [place]

## 2023-05-04 ENCOUNTER — Ambulatory Visit (INDEPENDENT_AMBULATORY_CARE_PROVIDER_SITE_OTHER): Admitting: Internal Medicine

## 2023-05-04 VITALS — BP 118/78 | HR 76 | Temp 98.4°F | Ht 62.0 in | Wt 140.0 lb

## 2023-05-04 DIAGNOSIS — E7849 Other hyperlipidemia: Secondary | ICD-10-CM | POA: Diagnosis not present

## 2023-05-04 DIAGNOSIS — Z01818 Encounter for other preprocedural examination: Secondary | ICD-10-CM | POA: Diagnosis not present

## 2023-05-04 DIAGNOSIS — I10 Essential (primary) hypertension: Secondary | ICD-10-CM | POA: Diagnosis not present

## 2023-05-04 DIAGNOSIS — K219 Gastro-esophageal reflux disease without esophagitis: Secondary | ICD-10-CM

## 2023-05-04 DIAGNOSIS — R7303 Prediabetes: Secondary | ICD-10-CM

## 2023-05-04 DIAGNOSIS — K449 Diaphragmatic hernia without obstruction or gangrene: Secondary | ICD-10-CM

## 2023-05-04 NOTE — Assessment & Plan Note (Addendum)
 Preop evaluation for left total hip replacement by Dr. Lequita Halt on 4/21 Chronic medical problems are stable except GERD and will refer to GI No symptoms concerning for angina or respiratory disease No issues with anesthesia in the past, bleeding or blood clots EKG done recently within normal limits Low risk for surgery from a medical and cardiovascular point  - will let surgery know

## 2023-05-04 NOTE — Assessment & Plan Note (Addendum)
 Chronic H/o erosive esophagitis GERD not ideally controlled Has small hiatal hernia  Continue omeprazole 40 mg daily Referral to GI

## 2023-05-04 NOTE — Assessment & Plan Note (Addendum)
Chronic Blood pressure well-controlled Continue benazepril 40 mg daily

## 2023-05-04 NOTE — Assessment & Plan Note (Signed)
 Chronic Seen on Ct May be contributing to her GERD ? Contributing to her chest tightness

## 2023-05-04 NOTE — Assessment & Plan Note (Addendum)
 Chronic Lab Results  Component Value Date   HGBA1C 6.1 06/09/2022   Low sugar / carb diet Stressed regular exercise

## 2023-05-04 NOTE — Assessment & Plan Note (Addendum)
 Chronic Regular exercise and healthy diet encouraged Will discuss CT CAC at her CPE Continue lifestyle control

## 2023-05-07 NOTE — Progress Notes (Signed)
 COVID Vaccine received:  []  No [x]  Yes Date of any COVID positive Test in last 90 days:  PCP - Cheryll Cockayne, MD   clearance in 05-04-23 Epic note and scanned to [facility] Cardiologist -   Chest x-ray - 06-27-2017  2v  Epic EKG -  03-31-2023  Epic Stress Test -  ECHO -  Cardiac Cath -   PCR screen: [x]  Ordered & Completed []   No Order but Needs PROFEND     []   N/A for this surgery  Surgery Plan:  []  Ambulatory   [x]  Outpatient in bed  []  Admit Anesthesia:    []  General  []  Spinal  [x]   Choice []   MAC  Pacemaker / ICD device [x]  No []  Yes   Spinal Cord Stimulator:[x]  No []  Yes       History of Sleep Apnea? [x]  No []  Yes   CPAP used?- [x]  No []  Yes    Does the patient monitor blood sugar?   []  N/A   []  No []  Yes  Patient has: []  NO Hx DM   [x]  Pre-DM   []  DM1  []   DM2 Last A1c was: 6.1 on  06-09-2022      Blood Thinner / Instructions:  none Aspirin Instructions:  none  ERAS Protocol Ordered: []  No  [x]  Yes PRE-SURGERY []  ENSURE  [x]  G2    Patient is to be NPO after: 0930  Dental hx: []  Dentures:  []  N/A      []  Bridge or Partial:                   []  Loose or Damaged teeth:   Comments:Patient was given the 5 CHG shower / bath instructions for THA surgery along with 2 bottles of the CHG soap. Patient will start this on: 05-18-23  All questions were asked and answered, Patient voiced understanding of this process.    Activity level: Patient is able / unable to climb a flight of stairs without difficulty; []  No CP  []  No SOB, but would have ___   Patient can / can not perform ADLs without assistance.   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   Patient denies shortness of breath, fever, cough and chest pain at PAT appointment.  Patient verbalized understanding and agreement to the Pre-Surgical Instructions that were given to them at this PAT appointment. Patient was also educated of the need to review these PAT  instructions again prior to her surgery.I reviewed the appropriate phone numbers to call if they have any and questions or concerns.

## 2023-05-07 NOTE — Patient Instructions (Signed)
 SURGICAL WAITING ROOM VISITATION Patients having surgery or a procedure may have no more than 2 support people in the waiting area - these visitors may rotate in the visitor waiting room.   If the patient needs to stay at the hospital during part of their recovery, the visitor guidelines for inpatient rooms apply.  PRE-OP VISITATION  Pre-op nurse will coordinate an appropriate time for 1 support person to accompany the patient in pre-op.  This support person may not rotate.  This visitor will be contacted when the time is appropriate for the visitor to come back in the pre-op area.  Please refer to the [facility] website for the visitor guidelines for Inpatients (after your surgery is over and you are in a regular room).  You are not required to quarantine at this time prior to your surgery. However, you must do this: Hand Hygiene often Do NOT share personal items Notify your provider if you are in close contact with someone who has COVID or you develop fever 100.4 or greater, new onset of sneezing, cough, sore throat, shortness of breath or body aches.  If you test positive for Covid or have been in contact with anyone that has tested positive in the last 10 days please notify you surgeon.    Your procedure is scheduled on:  MONDAY  May 22, 2023  Report to [facility] Main Entrance: [facility] entrance where the [employer] is available.   Report to admitting at: 10:00    AM  Call this number if you have any questions or problems the morning of surgery [phone]  Do not eat food after Midnight the night prior to your surgery/procedure.  After Midnight you may have the following liquids until  09:30 AM DAY OF SURGERY  Clear Liquid Diet Water Black Coffee (sugar ok, NO MILK/CREAM OR CREAMERS)  Tea (sugar ok, NO MILK/CREAM OR CREAMERS) regular and decaf                             Plain Jell-O  with no fruit (NO RED)                                           Fruit ices  (not with fruit pulp, NO RED)                                     Popsicles (NO RED)                                                                  Juice: NO CITRUS JUICES: only apple, WHITE grape, WHITE cranberry Sports drinks like Gatorade or Powerade (NO RED)                   The day of surgery:  Drink ONE (1) Pre-Surgery G2 at  09:30 AM the morning of surgery. Drink in one sitting. Do not sip.  This drink was given to you during your hospital pre-op appointment visit. Nothing else to drink after completing the  Pre-Surgery G2 : No candy, chewing gum or throat lozenges.    FOLLOW ANY ADDITIONAL PRE OP INSTRUCTIONS YOU RECEIVED FROM YOUR SURGEON'S OFFICE!!!   Oral Hygiene is also important to reduce your risk of infection.        Remember - BRUSH YOUR TEETH THE MORNING OF SURGERY WITH YOUR REGULAR TOOTHPASTE  Do NOT smoke after Midnight the night before surgery.  STOP TAKING all Vitamins, Herbs and supplements 1 week before your surgery.   DO NOT TAKE Benazepril on the morning of your surgery.   Take ONLY these medicines the morning of surgery with A SIP OF WATER:  omeprazole, gabapentin, and Tylenol if needed for pain.                    You may not have any metal on your body including hair pins, jewelry, and body piercing  Do not wear make-up, lotions, powders, perfumes or deodorant  Do not wear nail polish including gel and S&S, artificial / acrylic nails, or any other type of covering on natural nails including finger and toenails. If you have artificial nails, gel coating, etc., that needs to be removed by a nail salon, Please have this removed prior to surgery. Not doing so may mean that your surgery could be cancelled or delayed if the Surgeon or anesthesia staff feels like they are unable to monitor you safely.   Do not shave 48 hours prior to surgery to avoid nicks in your skin which may contribute to postoperative infections.   Contacts, Hearing Aids, dentures or  bridgework may not be worn into surgery. DENTURES WILL BE REMOVED PRIOR TO SURGERY PLEASE DO NOT APPLY "Poly grip" OR ADHESIVES!!!  You may bring a small overnight bag with you on the day of surgery, only pack items that are not valuable. [facility] IS NOT RESPONSIBLE   FOR VALUABLES THAT ARE LOST OR STOLEN.   Do not bring your home medications to the hospital. The Pharmacy will dispense medications listed on your medication list to you during your admission in the Hospital.  Special Instructions: Bring a copy of your healthcare power of attorney and living will documents the day of surgery, if you wish to have them scanned into your [facility] Records- EPIC  Please read over the following fact sheets you were given: IF YOU HAVE QUESTIONS ABOUT YOUR PRE-OP INSTRUCTIONS, PLEASE CALL [phone].     Pre-operative 5 CHG Bath Instructions   You can play a key role in reducing the risk of infection after surgery. Your skin needs to be as free of germs as possible. You can reduce the number of germs on your skin by washing with CHG (chlorhexidine gluconate) soap before surgery. CHG is an antiseptic soap that kills germs and continues to kill germs even after washing.   DO NOT use if you have an allergy to chlorhexidine/CHG or antibacterial soaps. If your skin becomes reddened or irritated, stop using the CHG and notify one of our RNs at [phone]  Please shower with the CHG soap starting 4 days before surgery using the following schedule: START SHOWERS ON   THURSDAY  May 18, 2023  Please keep in mind the following:  DO NOT shave, including legs and underarms, starting the day of your first shower.   You may shave your face at any point before/day of surgery.   Place clean sheets on your bed the day you start  using CHG soap. Use a clean washcloth (not used since being washed) for each shower. DO NOT sleep with pets once you start using the CHG.   CHG Shower Instructions:  If you choose to wash your hair and private area, wash first with your normal shampoo/soap.  After you use shampoo/soap, rinse your hair and body thoroughly to remove shampoo/soap residue.  Turn the water OFF and apply about 3 tablespoons (45 ml) of CHG soap to a CLEAN washcloth.  Apply CHG soap ONLY FROM YOUR NECK DOWN TO YOUR TOES (washing for 3-5 minutes)  DO NOT use CHG soap on face, private areas, open wounds, or sores.  Pay special attention to the area where your surgery is being performed.  If you are having back surgery, having someone wash your back for you may be helpful.  Wait 2 minutes after CHG soap is applied, then you may rinse off the CHG soap.  Pat dry with a clean towel  Put on clean clothes/pajamas   If you choose to wear lotion, please use ONLY the CHG-compatible lotions on the back of this paper.     Additional instructions for the day of surgery: DO NOT APPLY any lotions, deodorants, cologne, or perfumes.   Put on clean/comfortable clothes.  Brush your teeth.  Ask your nurse before applying any prescription medications to the skin.      CHG Compatible Lotions   Aveeno Moisturizing lotion  Cetaphil Moisturizing Cream  Cetaphil Moisturizing Lotion  Clairol Herbal Essence Moisturizing Lotion, Dry Skin  Clairol Herbal Essence Moisturizing Lotion, Extra Dry Skin  Clairol Herbal Essence Moisturizing Lotion, Normal Skin  Curel Age Defying Therapeutic Moisturizing Lotion with Alpha Hydroxy  Curel Extreme Care Body Lotion  Curel Soothing Hands Moisturizing Hand Lotion  Curel Therapeutic Moisturizing Cream, Fragrance-Free  Curel Therapeutic Moisturizing Lotion, Fragrance-Free  Curel Therapeutic Moisturizing Lotion, Original Formula  Eucerin Daily Replenishing Lotion  Eucerin Dry Skin Therapy Plus  Alpha Hydroxy Crme  Eucerin Dry Skin Therapy Plus Alpha Hydroxy Lotion  Eucerin Original Crme  Eucerin Original Lotion  Eucerin Plus Crme Eucerin Plus Lotion  Eucerin TriLipid Replenishing Lotion  Keri Anti-Bacterial Hand Lotion  Keri Deep Conditioning Original Lotion Dry Skin Formula Softly Scented  Keri Deep Conditioning Original Lotion, Fragrance Free Sensitive Skin Formula  Keri Lotion Fast Absorbing Fragrance Free Sensitive Skin Formula  Keri Lotion Fast Absorbing Softly Scented Dry Skin Formula  Keri Original Lotion  Keri Skin Renewal Lotion Keri Silky Smooth Lotion  Keri Silky Smooth Sensitive Skin Lotion  Nivea Body Creamy Conditioning Oil  Nivea Body Extra Enriched Lotion  Nivea Body Original Lotion  Nivea Body Sheer Moisturizing Lotion Nivea Crme  Nivea Skin Firming Lotion  NutraDerm 30 Skin Lotion  NutraDerm Skin Lotion  NutraDerm Therapeutic Skin Cream  NutraDerm Therapeutic Skin Lotion  ProShield Protective Hand Cream  Provon moisturizing lotion   FAILURE TO FOLLOW THESE INSTRUCTIONS MAY RESULT IN THE CANCELLATION OF YOUR SURGERY  PATIENT SIGNATURE_________________________________  NURSE SIGNATURE__________________________________  ________________________________________________________________________       Rogelia Mire    An incentive spirometer is a tool that can help keep your lungs clear and active. This tool measures how well you are filling your lungs with each breath. Taking  long deep breaths may help reverse or decrease the chance of developing breathing (pulmonary) problems (especially infection) following: A long period of time when you are unable to move or be active. BEFORE THE PROCEDURE  If the spirometer includes an indicator to show your best effort, your nurse or respiratory therapist will set it to a desired goal. If possible, sit up straight or lean slightly forward. Try not to slouch. Hold the incentive spirometer in an  upright position. INSTRUCTIONS FOR USE  Sit on the edge of your bed if possible, or sit up as far as you can in bed or on a chair. Hold the incentive spirometer in an upright position. Breathe out normally. Place the mouthpiece in your mouth and seal your lips tightly around it. Breathe in slowly and as deeply as possible, raising the piston or the ball toward the top of the column. Hold your breath for 3-5 seconds or for as long as possible. Allow the piston or ball to fall to the bottom of the column. Remove the mouthpiece from your mouth and breathe out normally. Rest for a few seconds and repeat Steps 1 through 7 at least 10 times every 1-2 hours when you are awake. Take your time and take a few normal breaths between deep breaths. The spirometer may include an indicator to show your best effort. Use the indicator as a goal to work toward during each repetition. After each set of 10 deep breaths, practice coughing to be sure your lungs are clear. If you have an incision (the cut made at the time of surgery), support your incision when coughing by placing a pillow or rolled up towels firmly against it. Once you are able to get out of bed, walk around indoors and cough well. You may stop using the incentive spirometer when instructed by your caregiver.  RISKS AND COMPLICATIONS Take your time so you do not get dizzy or light-headed. If you are in pain, you may need to take or ask for pain medication before doing incentive spirometry. It is harder to take a deep breath if you are having pain. AFTER USE Rest and breathe slowly and easily. It can be helpful to keep track of a log of your progress. Your caregiver can provide you with a simple table to help with this. If you are using the spirometer at home, follow these instructions: SEEK MEDICAL CARE IF:  You are having difficultly using the spirometer. You have trouble using the spirometer as often as instructed. Your pain medication is not  giving enough relief while using the spirometer. You develop fever of 100.5 F (38.1 C) or higher.                                                                                                    SEEK IMMEDIATE MEDICAL CARE IF:  You cough up bloody sputum that had not been present before. You develop fever of 102 F (38.9 C) or greater. You develop worsening pain at or near the incision site. MAKE SURE YOU:  Understand these instructions. Will watch your condition.  Will get help right away if you are not doing well or get worse. Document Released: 05/30/2006 Document Revised: 04/11/2011 Document Reviewed: 07/31/2006 [facility] Patient Information 2014 [place], [place].        WHAT IS A BLOOD TRANSFUSION? Blood Transfusion Information  A transfusion is the replacement of blood or some of its parts. Blood is made up of multiple cells which provide different functions. Red blood cells carry oxygen and are used for blood loss replacement. White blood cells fight against infection. Platelets control bleeding. Plasma helps clot blood. Other blood products are available for specialized needs, such as hemophilia or other clotting disorders. BEFORE THE TRANSFUSION  Who gives blood for transfusions?  Healthy volunteers who are fully evaluated to make sure their blood is safe. This is blood bank blood. Transfusion therapy is the safest it has ever been in the practice of medicine. Before blood is taken from a donor, a complete history is taken to make sure that person has no history of diseases nor engages in risky social behavior (examples are intravenous drug use or sexual activity with multiple partners). The donor's travel history is screened to minimize risk of transmitting infections, such as malaria. The donated blood is tested for signs of infectious diseases, such as HIV and hepatitis. The blood is then tested to be sure it is compatible with you in order to minimize the chance of a  transfusion reaction. If you or a relative donates blood, this is often done in anticipation of surgery and is not appropriate for emergency situations. It takes many days to process the donated blood. RISKS AND COMPLICATIONS Although transfusion therapy is very safe and saves many lives, the main dangers of transfusion include:  Getting an infectious disease. Developing a transfusion reaction. This is an allergic reaction to something in the blood you were given. Every precaution is taken to prevent this. The decision to have a blood transfusion has been considered carefully by your caregiver before blood is given. Blood is not given unless the benefits outweigh the risks. AFTER THE TRANSFUSION Right after receiving a blood transfusion, you will usually feel much better and more energetic. This is especially true if your red blood cells have gotten low (anemic). The transfusion raises the level of the red blood cells which carry oxygen, and this usually causes an energy increase. The nurse administering the transfusion will monitor you carefully for complications. HOME CARE INSTRUCTIONS  No special instructions are needed after a transfusion. You may find your energy is better. Speak with your caregiver about any limitations on activity for underlying diseases you may have. SEEK MEDICAL CARE IF:  Your condition is not improving after your transfusion. You develop redness or irritation at the intravenous (IV) site. SEEK IMMEDIATE MEDICAL CARE IF:  Any of the following symptoms occur over the next 12 hours: Shaking chills. You have a temperature by mouth above 102 F (38.9 C), not controlled by medicine. Chest, back, or muscle pain. People around you feel you are not acting correctly or are confused. Shortness of breath or difficulty breathing. Dizziness and fainting. You get a rash or develop hives. You have a decrease in urine output. Your urine turns a dark color or changes to pink, red,  or brown. Any of the following symptoms occur over the next 10 days: You have a temperature by mouth above 102 F (38.9 C), not controlled by medicine. Shortness of breath. Weakness after normal activity. The white part of the eye turns yellow (jaundice). You  have a decrease in the amount of urine or are urinating less often. Your urine turns a dark color or changes to pink, red, or brown. Document Released: 01/15/2000 Document Revised: 04/11/2011 Document Reviewed: 09/03/2007 [facility] Patient Information 2014 [place], [place].  _______________________________________________________________________       If you would like to see a video about joint replacement:   [email or web address]

## 2023-05-09 ENCOUNTER — Encounter (HOSPITAL_COMMUNITY)
Admission: RE | Admit: 2023-05-09 | Discharge: 2023-05-09 | Disposition: A | Discharge status: Home / Self Care | Source: Ambulatory Visit | Attending: Orthopedic Surgery | Admitting: Orthopedic Surgery

## 2023-05-09 ENCOUNTER — Encounter (HOSPITAL_COMMUNITY): Payer: Self-pay

## 2023-05-09 ENCOUNTER — Other Ambulatory Visit: Payer: Self-pay

## 2023-05-09 VITALS — BP 126/90 | HR 78 | Temp 98.5°F | Resp 14 | Ht 62.0 in | Wt 141.0 lb

## 2023-05-09 DIAGNOSIS — Z01818 Encounter for other preprocedural examination: Secondary | ICD-10-CM | POA: Insufficient documentation

## 2023-05-09 DIAGNOSIS — I10 Essential (primary) hypertension: Secondary | ICD-10-CM | POA: Diagnosis not present

## 2023-05-09 DIAGNOSIS — M1612 Unilateral primary osteoarthritis, left hip: Secondary | ICD-10-CM | POA: Insufficient documentation

## 2023-05-09 DIAGNOSIS — R7303 Prediabetes: Secondary | ICD-10-CM | POA: Insufficient documentation

## 2023-05-09 HISTORY — DX: Pneumonia, unspecified organism: J18.9

## 2023-05-09 HISTORY — DX: Malignant (primary) neoplasm, unspecified: C80.1

## 2023-05-09 HISTORY — DX: Myoneural disorder, unspecified: G70.9

## 2023-05-09 HISTORY — DX: Prediabetes: R73.03

## 2023-05-09 HISTORY — DX: Anemia, unspecified: D64.9

## 2023-05-09 LAB — TYPE AND SCREEN
ABO/RH(D): O POS
Antibody Screen: NEGATIVE

## 2023-05-09 LAB — CBC
HCT: 37.1 % (ref 36.0–46.0)
Hemoglobin: 12.2 g/dL (ref 12.0–15.0)
MCH: 29.6 pg (ref 26.0–34.0)
MCHC: 32.9 g/dL (ref 30.0–36.0)
MCV: 90 fL (ref 80.0–100.0)
Platelets: 235 10*3/uL (ref 150–400)
RBC: 4.12 MIL/uL (ref 3.87–5.11)
RDW: 13.4 % (ref 11.5–15.5)
WBC: 7.2 10*3/uL (ref 4.0–10.5)
nRBC: 0 % (ref 0.0–0.2)

## 2023-05-09 LAB — BASIC METABOLIC PANEL WITH GFR
Anion gap: 8 (ref 5–15)
BUN: 13 mg/dL (ref 8–23)
CO2: 25 mmol/L (ref 22–32)
Calcium: 9.3 mg/dL (ref 8.9–10.3)
Chloride: 102 mmol/L (ref 98–111)
Creatinine, Ser: 0.71 mg/dL (ref 0.44–1.00)
GFR, Estimated: >60 mL/min (ref >60)
Glucose, Bld: 100 mg/dL — ABNORMAL HIGH (ref 70–99)
Potassium: 4 mmol/L (ref 3.5–5.1)
Sodium: 135 mmol/L (ref 135–145)

## 2023-05-09 LAB — SURGICAL PCR SCREEN
MRSA, PCR: NEGATIVE
Staphylococcus aureus: NEGATIVE

## 2023-05-22 ENCOUNTER — Ambulatory Visit (HOSPITAL_COMMUNITY): Admitting: Anesthesiology

## 2023-05-22 ENCOUNTER — Other Ambulatory Visit: Payer: Self-pay

## 2023-05-22 ENCOUNTER — Encounter (HOSPITAL_COMMUNITY): Payer: Self-pay | Admitting: Orthopedic Surgery

## 2023-05-22 ENCOUNTER — Encounter (HOSPITAL_COMMUNITY)
Admission: RE | Disposition: A | Discharge status: Home / Self Care | Payer: Self-pay | Source: Ambulatory Visit | Attending: Orthopedic Surgery

## 2023-05-22 ENCOUNTER — Observation Stay (HOSPITAL_COMMUNITY)

## 2023-05-22 ENCOUNTER — Observation Stay (HOSPITAL_COMMUNITY)
Admission: RE | Admit: 2023-05-22 | Discharge: 2023-05-23 | Disposition: A | Discharge status: Home / Self Care | Payer: Medicare HMO | Source: Ambulatory Visit | Attending: Orthopedic Surgery | Admitting: Orthopedic Surgery

## 2023-05-22 ENCOUNTER — Ambulatory Visit (HOSPITAL_COMMUNITY)

## 2023-05-22 DIAGNOSIS — Z96642 Presence of left artificial hip joint: Secondary | ICD-10-CM | POA: Diagnosis not present

## 2023-05-22 DIAGNOSIS — M1612 Unilateral primary osteoarthritis, left hip: Secondary | ICD-10-CM | POA: Diagnosis not present

## 2023-05-22 DIAGNOSIS — I10 Essential (primary) hypertension: Secondary | ICD-10-CM | POA: Insufficient documentation

## 2023-05-22 DIAGNOSIS — M169 Osteoarthritis of hip, unspecified: Principal | ICD-10-CM | POA: Diagnosis present

## 2023-05-22 DIAGNOSIS — Z79899 Other long term (current) drug therapy: Secondary | ICD-10-CM | POA: Diagnosis not present

## 2023-05-22 DIAGNOSIS — Z8542 Personal history of malignant neoplasm of other parts of uterus: Secondary | ICD-10-CM | POA: Insufficient documentation

## 2023-05-22 HISTORY — PX: TOTAL HIP ARTHROPLASTY: SHX124

## 2023-05-22 LAB — GLUCOSE, CAPILLARY: Glucose-Capillary: 110 mg/dL — ABNORMAL HIGH (ref 70–99)

## 2023-05-22 LAB — ABO/RH: ABO/RH(D): O POS

## 2023-05-22 SURGERY — ARTHROPLASTY, HIP, TOTAL, ANTERIOR APPROACH
Anesthesia: Spinal | Site: Hip | Laterality: Left

## 2023-05-22 MED ORDER — FENTANYL CITRATE (PF) 100 MCG/2ML IJ SOLN
INTRAMUSCULAR | Status: DC | PRN
  Administered 2023-05-22: 50 ug via INTRAVENOUS

## 2023-05-22 MED ORDER — DEXAMETHASONE SODIUM PHOSPHATE 10 MG/ML IJ SOLN
INTRAMUSCULAR | Status: DC | PRN
  Administered 2023-05-22: 10 mg via INTRAVENOUS

## 2023-05-22 MED ORDER — TRAMADOL HCL 50 MG PO TABS
50.0000 mg | ORAL_TABLET | Freq: Four times a day (QID) | ORAL | Status: DC | PRN
  Administered 2023-05-22 – 2023-05-23 (×2): 100 mg via ORAL
  Filled 2023-05-22 (×2): qty 2

## 2023-05-22 MED ORDER — DOCUSATE SODIUM 100 MG PO CAPS
100.0000 mg | ORAL_CAPSULE | Freq: Two times a day (BID) | ORAL | Status: DC
  Administered 2023-05-22 – 2023-05-23 (×2): 100 mg via ORAL
  Filled 2023-05-22 (×2): qty 1

## 2023-05-22 MED ORDER — EPHEDRINE 5 MG/ML INJ
INTRAVENOUS | Status: AC
  Filled 2023-05-22: qty 15

## 2023-05-22 MED ORDER — WATER FOR IRRIGATION, STERILE IR SOLN
Status: DC | PRN
  Administered 2023-05-22: 1000 mL

## 2023-05-22 MED ORDER — BISACODYL 10 MG RE SUPP: 10.0000 mg | Freq: Every day | RECTAL | Status: DC | PRN

## 2023-05-22 MED ORDER — POVIDONE-IODINE 10 % EX SWAB: 2.0000 | Freq: Once | CUTANEOUS | Status: DC

## 2023-05-22 MED ORDER — FENTANYL CITRATE (PF) 100 MCG/2ML IJ SOLN
INTRAMUSCULAR | Status: AC
  Filled 2023-05-22: qty 2

## 2023-05-22 MED ORDER — MORPHINE SULFATE (PF) 2 MG/ML IV SOLN: 0.5000 mg | INTRAVENOUS | Status: DC | PRN

## 2023-05-22 MED ORDER — LACTATED RINGERS IV SOLN: INTRAVENOUS | Status: DC

## 2023-05-22 MED ORDER — CHLORHEXIDINE GLUCONATE 0.12 % MT SOLN
15.0000 mL | Freq: Once | OROMUCOSAL | Status: AC
  Administered 2023-05-22: 15 mL via OROMUCOSAL

## 2023-05-22 MED ORDER — RIVAROXABAN 10 MG PO TABS
10.0000 mg | ORAL_TABLET | Freq: Every day | ORAL | Status: DC
  Administered 2023-05-23: 10 mg via ORAL
  Filled 2023-05-22: qty 1

## 2023-05-22 MED ORDER — SODIUM CHLORIDE 0.9 % IV SOLN: INTRAVENOUS | Status: DC

## 2023-05-22 MED ORDER — BUPIVACAINE-EPINEPHRINE (PF) 0.25% -1:200000 IJ SOLN
INTRAMUSCULAR | Status: AC
Start: 2023-05-22 — End: ?
  Filled 2023-05-22: qty 30

## 2023-05-22 MED ORDER — 0.9 % SODIUM CHLORIDE (POUR BTL) OPTIME
TOPICAL | Status: DC | PRN
  Administered 2023-05-22: 1000 mL

## 2023-05-22 MED ORDER — METHOCARBAMOL 500 MG PO TABS
500.0000 mg | ORAL_TABLET | Freq: Four times a day (QID) | ORAL | Status: DC | PRN
  Administered 2023-05-22 – 2023-05-23 (×3): 500 mg via ORAL
  Filled 2023-05-22 (×3): qty 1

## 2023-05-22 MED ORDER — PHENYLEPHRINE 80 MCG/ML (10ML) SYRINGE FOR IV PUSH (FOR BLOOD PRESSURE SUPPORT)
PREFILLED_SYRINGE | INTRAVENOUS | Status: DC | PRN
  Administered 2023-05-22: 160 ug via INTRAVENOUS
  Administered 2023-05-22: 80 ug via INTRAVENOUS
  Administered 2023-05-22: 160 ug via INTRAVENOUS

## 2023-05-22 MED ORDER — METOCLOPRAMIDE HCL 5 MG PO TABS: 5.0000 mg | ORAL_TABLET | Freq: Three times a day (TID) | ORAL | Status: DC | PRN

## 2023-05-22 MED ORDER — ACETAMINOPHEN 10 MG/ML IV SOLN
1000.0000 mg | Freq: Once | INTRAVENOUS | Status: AC
  Administered 2023-05-22: 1000 mg via INTRAVENOUS
  Filled 2023-05-22: qty 100

## 2023-05-22 MED ORDER — CEFAZOLIN SODIUM-DEXTROSE 2-4 GM/100ML-% IV SOLN
2.0000 g | INTRAVENOUS | Status: AC
  Administered 2023-05-22: 2 g via INTRAVENOUS
  Filled 2023-05-22: qty 100

## 2023-05-22 MED ORDER — CEFAZOLIN SODIUM-DEXTROSE 2-4 GM/100ML-% IV SOLN
2.0000 g | Freq: Four times a day (QID) | INTRAVENOUS | Status: AC
  Administered 2023-05-22 – 2023-05-23 (×2): 2 g via INTRAVENOUS
  Filled 2023-05-22 (×2): qty 100

## 2023-05-22 MED ORDER — AMISULPRIDE (ANTIEMETIC) 5 MG/2ML IV SOLN: 10.0000 mg | Freq: Once | INTRAVENOUS | Status: DC | PRN

## 2023-05-22 MED ORDER — FENTANYL CITRATE PF 50 MCG/ML IJ SOSY: 25.0000 ug | PREFILLED_SYRINGE | INTRAMUSCULAR | Status: DC | PRN

## 2023-05-22 MED ORDER — PROPOFOL 500 MG/50ML IV EMUL
INTRAVENOUS | Status: DC | PRN
  Administered 2023-05-22: 40 ug/kg/min via INTRAVENOUS
  Administered 2023-05-22: 20 mg via INTRAVENOUS

## 2023-05-22 MED ORDER — ONDANSETRON HCL 4 MG/2ML IJ SOLN: 4.0000 mg | Freq: Four times a day (QID) | INTRAMUSCULAR | Status: DC | PRN

## 2023-05-22 MED ORDER — MIDAZOLAM HCL 2 MG/2ML IJ SOLN
INTRAMUSCULAR | Status: AC
  Filled 2023-05-22: qty 2

## 2023-05-22 MED ORDER — DEXAMETHASONE SODIUM PHOSPHATE 10 MG/ML IJ SOLN: 8.0000 mg | Freq: Once | INTRAMUSCULAR | Status: DC

## 2023-05-22 MED ORDER — ONDANSETRON HCL 4 MG PO TABS: 4.0000 mg | ORAL_TABLET | Freq: Four times a day (QID) | ORAL | Status: DC | PRN

## 2023-05-22 MED ORDER — DEXAMETHASONE SODIUM PHOSPHATE 10 MG/ML IJ SOLN
10.0000 mg | Freq: Once | INTRAMUSCULAR | Status: AC
  Administered 2023-05-23: 10 mg via INTRAVENOUS
  Filled 2023-05-22: qty 1

## 2023-05-22 MED ORDER — PROPOFOL 10 MG/ML IV BOLUS
INTRAVENOUS | Status: AC
  Filled 2023-05-22: qty 20

## 2023-05-22 MED ORDER — EPHEDRINE SULFATE-NACL 50-0.9 MG/10ML-% IV SOSY
PREFILLED_SYRINGE | INTRAVENOUS | Status: DC | PRN
  Administered 2023-05-22: 5 mg via INTRAVENOUS
  Administered 2023-05-22 (×2): 10 mg via INTRAVENOUS

## 2023-05-22 MED ORDER — METOCLOPRAMIDE HCL 5 MG/ML IJ SOLN: 5.0000 mg | Freq: Three times a day (TID) | INTRAMUSCULAR | Status: DC | PRN

## 2023-05-22 MED ORDER — PHENOL 1.4 % MT LIQD: 1.0000 | OROMUCOSAL | Status: DC | PRN

## 2023-05-22 MED ORDER — GABAPENTIN 300 MG PO CAPS
300.0000 mg | ORAL_CAPSULE | Freq: Three times a day (TID) | ORAL | Status: DC
  Administered 2023-05-22 – 2023-05-23 (×2): 300 mg via ORAL
  Filled 2023-05-22 (×2): qty 1

## 2023-05-22 MED ORDER — MENTHOL 3 MG MT LOZG: 1.0000 | LOZENGE | OROMUCOSAL | Status: DC | PRN

## 2023-05-22 MED ORDER — BUPIVACAINE-EPINEPHRINE (PF) 0.25% -1:200000 IJ SOLN
INTRAMUSCULAR | Status: DC | PRN
  Administered 2023-05-22: 30 mL

## 2023-05-22 MED ORDER — HYDROCODONE-ACETAMINOPHEN 5-325 MG PO TABS
1.0000 | ORAL_TABLET | ORAL | Status: DC | PRN
  Administered 2023-05-22 – 2023-05-23 (×5): 2 via ORAL
  Filled 2023-05-22 (×5): qty 2

## 2023-05-22 MED ORDER — OXYCODONE HCL 5 MG/5ML PO SOLN: 5.0000 mg | Freq: Once | ORAL | Status: DC | PRN

## 2023-05-22 MED ORDER — POLYETHYLENE GLYCOL 3350 17 G PO PACK: 17.0000 g | PACK | Freq: Every day | ORAL | Status: DC | PRN

## 2023-05-22 MED ORDER — MAGNESIUM CITRATE PO SOLN: 1.0000 | Freq: Once | ORAL | Status: DC | PRN

## 2023-05-22 MED ORDER — ORAL CARE MOUTH RINSE: 15.0000 mL | Freq: Once | OROMUCOSAL | Status: AC

## 2023-05-22 MED ORDER — BUPIVACAINE IN DEXTROSE 0.75-8.25 % IT SOLN
INTRATHECAL | Status: DC | PRN
  Administered 2023-05-22: 1.8 mL via INTRATHECAL

## 2023-05-22 MED ORDER — PANTOPRAZOLE SODIUM 40 MG PO TBEC
40.0000 mg | DELAYED_RELEASE_TABLET | Freq: Every day | ORAL | Status: DC
  Administered 2023-05-23: 40 mg via ORAL
  Filled 2023-05-22: qty 1

## 2023-05-22 MED ORDER — METHOCARBAMOL 1000 MG/10ML IJ SOLN: 500.0000 mg | Freq: Four times a day (QID) | INTRAMUSCULAR | Status: DC | PRN

## 2023-05-22 MED ORDER — ACETAMINOPHEN 325 MG PO TABS: 325.0000 mg | ORAL_TABLET | Freq: Four times a day (QID) | ORAL | Status: DC | PRN

## 2023-05-22 MED ORDER — TRANEXAMIC ACID-NACL 1000-0.7 MG/100ML-% IV SOLN
1000.0000 mg | INTRAVENOUS | Status: AC
  Administered 2023-05-22: 1000 mg via INTRAVENOUS
  Filled 2023-05-22: qty 100

## 2023-05-22 MED ORDER — MIDAZOLAM HCL 2 MG/2ML IJ SOLN
INTRAMUSCULAR | Status: DC | PRN
  Administered 2023-05-22: 2 mg via INTRAVENOUS

## 2023-05-22 MED ORDER — OXYCODONE HCL 5 MG PO TABS: 5.0000 mg | ORAL_TABLET | Freq: Once | ORAL | Status: DC | PRN

## 2023-05-22 SURGICAL SUPPLY — 36 items
BAG COUNTER SPONGE SURGICOUNT (BAG) IMPLANT
BAG ZIPLOCK 12X15 (MISCELLANEOUS) IMPLANT
BLADE SAG 18X100X1.27 (BLADE) ×1 IMPLANT
COVER PERINEAL POST (MISCELLANEOUS) ×1 IMPLANT
COVER SURGICAL LIGHT HANDLE (MISCELLANEOUS) ×1 IMPLANT
CUP ACET PINNACLE SECTR 50MM (Hips) IMPLANT
DERMABOND ADVANCED .7 DNX12 (GAUZE/BANDAGES/DRESSINGS) ×1 IMPLANT
DRAPE FOOT SWITCH (DRAPES) ×1 IMPLANT
DRAPE STERI IOBAN 125X83 (DRAPES) ×1 IMPLANT
DRAPE U-SHAPE 47X51 STRL (DRAPES) ×2 IMPLANT
DRSG AQUACEL AG ADV 3.5X10 (GAUZE/BANDAGES/DRESSINGS) ×1 IMPLANT
DURAPREP 26ML APPLICATOR (WOUND CARE) ×1 IMPLANT
ELECT REM PT RETURN 15FT ADLT (MISCELLANEOUS) ×1 IMPLANT
GLOVE BIO SURGEON STRL SZ 6.5 (GLOVE) IMPLANT
GLOVE BIO SURGEON STRL SZ7 (GLOVE) IMPLANT
GLOVE BIO SURGEON STRL SZ8 (GLOVE) ×1 IMPLANT
GLOVE BIOGEL PI IND STRL 7.0 (GLOVE) IMPLANT
GLOVE BIOGEL PI IND STRL 8 (GLOVE) ×1 IMPLANT
GOWN STRL REUS W/ TWL LRG LVL3 (GOWN DISPOSABLE) ×2 IMPLANT
HEAD FEMORAL 32 CERAMIC (Hips) IMPLANT
HOLDER FOLEY CATH W/STRAP (MISCELLANEOUS) ×1 IMPLANT
KIT TURNOVER KIT A (KITS) IMPLANT
LINER MARATHON 32 50 (Hips) IMPLANT
MANIFOLD NEPTUNE II (INSTRUMENTS) ×1 IMPLANT
PACK ANTERIOR HIP CUSTOM (KITS) ×1 IMPLANT
PENCIL SMOKE EVACUATOR COATED (MISCELLANEOUS) ×1 IMPLANT
SPIKE FLUID TRANSFER (MISCELLANEOUS) ×1 IMPLANT
STEM FEM ACTIS STD SZ4 (Stem) IMPLANT
SUT ETHIBOND NAB CT1 #1 30IN (SUTURE) ×1 IMPLANT
SUT MNCRL AB 4-0 PS2 18 (SUTURE) ×1 IMPLANT
SUT VIC AB 2-0 CT1 TAPERPNT 27 (SUTURE) ×2 IMPLANT
SUTURE STRATFX 0 PDS 27 VIOLET (SUTURE) ×1 IMPLANT
TOWEL GREEN STERILE FF (TOWEL DISPOSABLE) ×1 IMPLANT
TRAY FOLEY MTR SLVR 14FR STAT (SET/KITS/TRAYS/PACK) IMPLANT
TRAY FOLEY MTR SLVR 16FR STAT (SET/KITS/TRAYS/PACK) ×1 IMPLANT
TUBE SUCTION HIGH CAP CLEAR NV (SUCTIONS) ×1 IMPLANT

## 2023-05-22 NOTE — Discharge Instructions (Addendum)
 Liliane Rei, MD Total Joint Specialist [facility] [place] [address] [place], [place] [address] [phone]  ANTERIOR APPROACH TOTAL HIP REPLACEMENT POSTOPERATIVE DIRECTIONS     Hip Rehabilitation, Guidelines Following Surgery  The results of a hip operation are greatly improved after range of motion and muscle strengthening exercises. Follow all safety measures which are given to protect your hip. If any of these exercises cause increased pain or swelling in your joint, decrease the amount until you are comfortable again. Then slowly increase the exercises. Call your caregiver if you have problems or questions.   HOME CARE INSTRUCTIONS  Remove items at home which could result in a fall. This includes throw rugs or furniture in walking pathways.  ICE to the affected hip as frequently as 20-30 minutes an hour and then as needed for pain and swelling. Continue to use ice on the hip for pain and swelling from surgery. You may notice swelling that will progress down to the foot and ankle. This is normal after surgery. Elevate the leg when you are not up walking on it.   Continue to use the breathing machine which will help keep your temperature down.  It is common for your temperature to cycle up and down following surgery, especially at night when you are not up moving around and exerting yourself.  The breathing machine keeps your lungs expanded and your temperature down.  DIET You may resume your previous home diet once your are discharged from the hospital.  DRESSING / WOUND CARE / SHOWERING You have an adhesive waterproof bandage over the incision. Leave this in place until your first follow-up appointment. Once you remove this you will not need to place another bandage.  You may begin showering 3 days following surgery, but do not submerge the incision under water .  ACTIVITY For the first 3-5 days, it is important to rest and keep the operative leg elevated.  You should, as a general rule, rest for 50 minutes and walk/stretch for 10 minutes per hour. After 5 days, you may slowly increase activity as tolerated.  Perform the exercises you were provided twice a day for about 15-20 minutes each session. Begin these 2 days following surgery. Walk with your walker as instructed. Use the walker until you are comfortable transitioning to a cane. Walk with the cane in the opposite hand of the operative leg. You may discontinue the cane once you are comfortable and walking steadily. Avoid periods of inactivity such as sitting longer than an hour when not asleep. This helps prevent blood clots.  Do not drive a car for 6 weeks or until released by your surgeon.  Do not drive while taking narcotics.  TED HOSE STOCKINGS Wear the elastic stockings on both legs for three weeks following surgery during the day. You may remove them at night while sleeping.  WEIGHT BEARING Weight bearing as tolerated with assist device (walker, cane, etc) as directed, use it as long as suggested by your surgeon or therapist, typically at least 4-6 weeks.  POSTOPERATIVE CONSTIPATION PROTOCOL Constipation - defined medically as fewer than three stools per week and severe constipation as less than one stool per week.  One of the most common issues patients have following surgery is constipation.  Even if you have a regular bowel pattern at home, your normal regimen is likely to be disrupted due to multiple reasons following surgery.  Combination of anesthesia, postoperative narcotics, change in appetite and fluid intake all can affect your bowels.  In order to avoid complications following surgery, here are some recommendations in order to help you during your recovery period.  Colace (docusate) - Pick up an over-the-counter form of Colace or another stool softener and take twice a day as long as you are requiring postoperative pain medications.  Take with a full glass of water  daily.  If  you experience loose stools or diarrhea, hold the colace until you stool forms back up.  If your symptoms do not get better within 1 week or if they get worse, check with your doctor. Dulcolax (bisacodyl ) - Pick up over-the-counter and take as directed by the product packaging as needed to assist with the movement of your bowels.  Take with a full glass of water .  Use this product as needed if not relieved by Colace only.  MiraLax  (polyethylene glycol) - Pick up over-the-counter to have on hand.  MiraLax  is a solution that will increase the amount of water  in your bowels to assist with bowel movements.  Take as directed and can mix with a glass of water , juice, soda, coffee, or tea.  Take if you go more than two days without a movement.Do not use MiraLax  more than once per day. Call your doctor if you are still constipated or irregular after using this medication for 7 days in a row.  If you continue to have problems with postoperative constipation, please contact the office for further assistance and recommendations.  If you experience "the worst abdominal pain ever" or develop nausea or vomiting, please contact the office immediatly for further recommendations for treatment.  ITCHING  If you experience itching with your medications, try taking only a single pain pill, or even half a pain pill at a time.  You can also use Benadryl over the counter for itching or also to help with sleep.   MEDICATIONS See your medication summary on the "After Visit Summary" that the nursing staff will review with you prior to discharge.  You may have some home medications which will be placed on hold until you complete the course of blood thinner medication.  It is important for you to complete the blood thinner medication as prescribed by your surgeon.  Continue your approved medications as instructed at time of discharge.  PRECAUTIONS If you experience chest pain or shortness of breath - call 911 immediately for  transfer to the hospital emergency department.  If you develop a fever greater that 101 F, purulent drainage from wound, increased redness or drainage from wound, foul odor from the wound/dressing, or calf pain - CONTACT YOUR SURGEON.                                                   FOLLOW-UP APPOINTMENTS Make sure you keep all of your appointments after your operation with your surgeon and caregivers. You should call the office at the above phone number and make an appointment for approximately two weeks after the date of your surgery or on the date instructed by your surgeon outlined in the "After Visit Summary".  RANGE OF MOTION AND STRENGTHENING EXERCISES  These exercises are designed to help you keep full movement of your hip joint. Follow your caregiver's or physical therapist's instructions. Perform all exercises about fifteen times, three times per day or as directed. Exercise both hips, even if you have had only  one joint replacement. These exercises can be done on a training (exercise) mat, on the floor, on a table or on a bed. Use whatever works the best and is most comfortable for you. Use music or television while you are exercising so that the exercises are a pleasant break in your day. This will make your life better with the exercises acting as a break in routine you can look forward to.  Lying on your back, slowly slide your foot toward your buttocks, raising your knee up off the floor. Then slowly slide your foot back down until your leg is straight again.  Lying on your back spread your legs as far apart as you can without causing discomfort.  Lying on your side, raise your upper leg and foot straight up from the floor as far as is comfortable. Slowly lower the leg and repeat.  Lying on your back, tighten up the muscle in the front of your thigh (quadriceps muscles). You can do this by keeping your leg straight and trying to raise your heel off the floor. This helps strengthen the  largest muscle supporting your knee.  Lying on your back, tighten up the muscles of your buttocks both with the legs straight and with the knee bent at a comfortable angle while keeping your heel on the floor.   POST-OPERATIVE OPIOID TAPER INSTRUCTIONS: It is important to wean off of your opioid medication as soon as possible. If you do not need pain medication after your surgery it is ok to stop day one. Opioids include: Codeine, Hydrocodone (Norco, Vicodin), Oxycodone (Percocet, oxycontin ) and hydromorphone amongst others.  Long term and even short term use of opiods can cause: Increased pain response Dependence Constipation Depression Respiratory depression And more.  Withdrawal symptoms can include Flu like symptoms Nausea, vomiting And more Techniques to manage these symptoms Hydrate well Eat regular healthy meals Stay active Use relaxation techniques(deep breathing, meditating, yoga) Do Not substitute Alcohol to help with tapering If you have been on opioids for less than two weeks and do not have pain than it is ok to stop all together.  Plan to wean off of opioids This plan should start within one week post op of your joint replacement. Maintain the same interval or time between taking each dose and first decrease the dose.  Cut the total daily intake of opioids by one tablet each day Next start to increase the time between doses. The last dose that should be eliminated is the evening dose.   IF YOU ARE TRANSFERRED TO A SKILLED REHAB FACILITY If the patient is transferred to a skilled rehab facility following release from the hospital, a list of the current medications will be sent to the facility for the patient to continue.  When discharged from the skilled rehab facility, please have the facility set up the patient's Home Health Physical Therapy prior to being released. Also, the skilled facility will be responsible for providing the patient with their medications at time of  release from the facility to include their pain medication, the muscle relaxants, and their blood thinner medication. If the patient is still at the rehab facility at time of the two week follow up appointment, the skilled rehab facility will also need to assist the patient in arranging follow up appointment in our office and any transportation needs.  MAKE SURE YOU:  Understand these instructions.  Get help right away if you are not doing well or get worse.    DENTAL ANTIBIOTICS:  In most  cases prophylactic antibiotics for Dental procdeures after total joint surgery are not necessary.  Exceptions are as follows:  1. History of prior total joint infection  2. Severely immunocompromised (Organ Transplant, cancer chemotherapy, Rheumatoid biologic meds such as Humera)  3. Poorly controlled diabetes (A1C &gt; 8.0, blood glucose over 200)  If you have one of these conditions, contact your surgeon for an antibiotic prescription, prior to your dental procedure.    Pick up stool softner and laxative for home use following surgery while on pain medications. Do not submerge incision under water . Please use good hand washing techniques while changing dressing each day. May shower starting three days after surgery. Please use a clean towel to pat the incision dry following showers. Continue to use ice for pain and swelling after surgery. Do not use any lotions or creams on the incision until instructed by your surgeon. Information on my medicine - XARELTO  (Rivaroxaban )  This medication education was reviewed with me or my healthcare representative as part of my discharge preparation.  Why was Xarelto  prescribed for you? Xarelto  was prescribed for you to reduce the risk of blood clots forming after orthopedic surgery. The medical term for these abnormal blood clots is venous thromboembolism (VTE).  What do you need to know about xarelto  ? Take your Xarelto  ONCE DAILY at the same time  every day. You may take it either with or without food.  If you have difficulty swallowing the tablet whole, you may crush it and mix in applesauce just prior to taking your dose.  Take Xarelto  exactly as prescribed by your doctor and DO NOT stop taking Xarelto  without talking to the doctor who prescribed the medication.  Stopping without other VTE prevention medication to take the place of Xarelto  may increase your risk of developing a clot.  After discharge, you should have regular check-up appointments with your healthcare provider that is prescribing your Xarelto .    What do you do if you miss a dose? If you miss a dose, take it as soon as you remember on the same day then continue your regularly scheduled once daily regimen the next day. Do not take two doses of Xarelto  on the same day.   Important Safety Information A possible side effect of Xarelto  is bleeding. You should call your healthcare provider right away if you experience any of the following: Bleeding from an injury or your nose that does not stop. Unusual colored urine (red or dark brown) or unusual colored stools (red or black). Unusual bruising for unknown reasons. A serious fall or if you hit your head (even if there is no bleeding).  Some medicines may interact with Xarelto  and might increase your risk of bleeding while on Xarelto . To help avoid this, consult your healthcare provider or pharmacist prior to using any new prescription or non-prescription medications, including herbals, vitamins, non-steroidal anti-inflammatory drugs (NSAIDs) and supplements.  This website has more information on Xarelto : www.[email or web address] .com.

## 2023-05-22 NOTE — Transfer of Care (Signed)
 Immediate Anesthesia Transfer of Care Note  Patient: Kendra Leonard  Procedure(s) Performed: Procedure(s): ARTHROPLASTY, HIP, TOTAL, ANTERIOR APPROACH (Left)  Patient Location: PACU  Anesthesia Type:Spinal  Level of Consciousness: awake, alert  and oriented  Airway & Oxygen Therapy: Patient Spontanous Breathing  Post-op Assessment: Report given to RN and Post -op Vital signs reviewed and stable  Post vital signs: Reviewed and stable  Last Vitals:  Vitals:   05/22/23 1030  BP: (!) 144/92  Pulse: 80  Resp: 15  Temp: 36.6 C  SpO2: 98%    Complications: No apparent anesthesia complications

## 2023-05-22 NOTE — Anesthesia Preprocedure Evaluation (Signed)
 Anesthesia Evaluation  Patient identified by MRN, date of birth, ID band Patient awake    Reviewed: Allergy & Precautions, NPO status , Patient's Chart, lab work & pertinent test results  Airway Mallampati: II  TM Distance: >3 FB Neck ROM: Full    Dental  (+) Dental Advisory Given   Pulmonary neg pulmonary ROS   breath sounds clear to auscultation       Cardiovascular hypertension, Pt. on medications  Rhythm:Regular Rate:Normal     Neuro/Psych  Headaches    GI/Hepatic Neg liver ROS, hiatal hernia,GERD  ,,  Endo/Other  negative endocrine ROS    Renal/GU negative Renal ROS     Musculoskeletal  (+) Arthritis ,    Abdominal   Peds  Hematology negative hematology ROS (+)   Anesthesia Other Findings   Reproductive/Obstetrics                             Anesthesia Physical Anesthesia Plan  ASA: 2  Anesthesia Plan: Spinal   Post-op Pain Management: Ofirmev  IV (intra-op)*   Induction:   PONV Risk Score and Plan: 2 and Dexamethasone , Propofol  infusion and Ondansetron   Airway Management Planned: Natural Airway and Simple Face Mask  Additional Equipment: None  Intra-op Plan:   Post-operative Plan:   Informed Consent: I have reviewed the patients History and Physical, chart, labs and discussed the procedure including the risks, benefits and alternatives for the proposed anesthesia with the patient or authorized representative who has indicated his/her understanding and acceptance.       Plan Discussed with: CRNA  Anesthesia Plan Comments:        Anesthesia Quick Evaluation

## 2023-05-22 NOTE — Evaluation (Signed)
 Physical Therapy Evaluation Patient Details Name: Kendra Leonard MRN: [id number] DOB: March 10, 1950 Today's Date: 05/22/2023  History of Present Illness  Pt is 73 yo female s/p L anterior THA on 05/22/23. Pt with hx including but not limited to HLD, OA, neuropathy, endometrial CA, osteopenia, GERD  Clinical Impression  Pt is s/p THA resulting in the deficits listed below (see PT Problem List). At baseline pt independent.  She does have home support. Pt lives in multilevel home with bedroom upstairs.  Does have recliner couch downstairs if needed.  Today, pt with good pain control and able to ambulate 78' with RW and CGA.  She is expected to progress well.  Discussed will likely be able to perform her home steps but will practice with therapy tomorrow. Pt will benefit from acute skilled PT to increase their independence and safety with mobility to facilitate discharge.          If plan is discharge home, recommend the following: A little help with walking and/or transfers;A little help with bathing/dressing/bathroom;Help with stairs or ramp for entrance   Can travel by private vehicle        Equipment Recommendations Rolling walker (2 wheels) (Youth RW)  Recommendations for Other Services       Functional Status Assessment Patient has had a recent decline in their functional status and demonstrates the ability to make significant improvements in function in a reasonable and predictable amount of time.     Precautions / Restrictions Precautions Precautions: Fall Restrictions LLE Weight Bearing Per Provider Order: Weight bearing as tolerated      Mobility  Bed Mobility Overal bed mobility: Needs Assistance Bed Mobility: Supine to Sit     Supine to sit: Supervision, HOB elevated          Transfers Overall transfer level: Needs assistance Equipment used: Rolling walker (2 wheels) Transfers: Sit to/from Stand Sit to Stand: Contact guard assist           General transfer  comment: cues to wait for RW    Ambulation/Gait Ambulation/Gait assistance: Contact guard assist Gait Distance (Feet): 80 Feet Assistive device: Rolling walker (2 wheels) Gait Pattern/deviations: Step-through pattern Gait velocity: functional     General Gait Details: cues for RW proximity  Stairs            Wheelchair Mobility     Tilt Bed    Modified Rankin (Stroke Patients Only)       Balance Overall balance assessment: Needs assistance Sitting-balance support: No upper extremity supported Sitting balance-Leahy Scale: Good     Standing balance support: No upper extremity supported, Bilateral upper extremity supported Standing balance-Leahy Scale: Fair Standing balance comment: RW to ambulate but could static stand without AD                             Pertinent Vitals/Pain Pain Assessment Pain Assessment: 0-10 Pain Score: 3  Pain Location: L hip Pain Descriptors / Indicators: Sore Pain Intervention(s): Limited activity within patient's tolerance, Monitored during session, Premedicated before session, Repositioned    Home Living Family/patient expects to be discharged to:: Private residence Living Arrangements: Spouse/significant other Available Help at Discharge: Family;Available 24 hours/day Type of Home: House Home Access: Stairs to enter Entrance Stairs-Rails: None Entrance Stairs-Number of Steps: step then threshold Alternate Level Stairs-Number of Steps: 14 Home Layout: Two level;1/2 bath on main level;Bed/bath upstairs Home Equipment: Grab bars - tub/shower      Prior  Function Prior Level of Function : Independent/Modified Independent;Driving                     Extremity/Trunk Assessment   Upper Extremity Assessment Upper Extremity Assessment: Overall WFL for tasks assessed    Lower Extremity Assessment Lower Extremity Assessment: LLE deficits/detail;RLE deficits/detail RLE Deficits / Details: ROM WFL: MMT  5/5 RLE Sensation: WNL LLE Deficits / Details: Expected post op changes; ROM WFL; MMT: ankle 5/5 , knee 3/5, hip 2/5 LLE Sensation: WNL    Cervical / Trunk Assessment Cervical / Trunk Assessment: Normal  Communication        Cognition Arousal: Alert Behavior During Therapy: WFL for tasks assessed/performed   PT - Cognitive impairments: No apparent impairments                                 Cueing       General Comments      Exercises     Assessment/Plan    PT Assessment Patient needs continued PT services  PT Problem List Decreased strength;Pain;Decreased range of motion;Decreased activity tolerance;Decreased mobility;Decreased balance;Decreased knowledge of use of DME       PT Treatment Interventions DME instruction;Therapeutic exercise;Gait training;Stair training;Functional mobility training;Therapeutic activities;Patient/family education;Balance training;Modalities    PT Goals (Current goals can be found in the Care Plan section)  Acute Rehab PT Goals Patient Stated Goal: return home PT Goal Formulation: With patient/family Time For Goal Achievement: 06/05/23 Potential to Achieve Goals: Good    Frequency 7X/week     Co-evaluation               AM-PAC PT "6 Clicks" Mobility  Outcome Measure Help needed turning from your back to your side while in a flat bed without using bedrails?: A Little Help needed moving from lying on your back to sitting on the side of a flat bed without using bedrails?: A Little Help needed moving to and from a bed to a chair (including a wheelchair)?: A Little Help needed standing up from a chair using your arms (e.g., wheelchair or bedside chair)?: A Little Help needed to walk in hospital room?: A Little Help needed climbing 3-5 steps with a railing? : A Little 6 Click Score: 18    End of Session Equipment Utilized During Treatment: Gait belt Activity Tolerance: Patient tolerated treatment well Patient  left: with call bell/phone within reach;with chair alarm set;in chair;with SCD's reapplied Nurse Communication: Mobility status PT Visit Diagnosis: Other abnormalities of gait and mobility (R26.89);Muscle weakness (generalized) (M62.81)    Time: 2130-8657 PT Time Calculation (min) (ACUTE ONLY): 28 min   Charges:   PT Evaluation $PT Eval Low Complexity: 1 Low PT Treatments $Gait Training: 8-22 mins PT General Charges $$ ACUTE PT VISIT: 1 Visit         Cyd Dowse, PT Acute Rehab [facility] [phone]   Carolynn Citrin 05/22/2023, 5:41 PM

## 2023-05-22 NOTE — Care Plan (Signed)
 Ortho Bundle Case Management Note  Patient Details  Name: Kendra Leonard MRN: [id number] Date of Birth: [birth year]  L THA on 05/22/23   DCP: Home with husband.  DME: RW ordered through [employer].  PT: HEP                  DME Arranged:  Walker rolling DME Agency:  [employer]  HH Arranged:    HH Agency:     Additional Comments: Please contact me with any questions of if this plan should need to change.  Bronwen Canon, [place] [facility]  [phone]   05/22/2023, 11:29 AM

## 2023-05-22 NOTE — Interval H&P Note (Signed)
 History and Physical Interval Note:  05/22/2023 10:31 AM  Kendra Leonard  has presented today for surgery, with the diagnosis of Left hip osteoarthritis.  The various methods of treatment have been discussed with the patient and family. After consideration of risks, benefits and other options for treatment, the patient has consented to  Procedure(s): ARTHROPLASTY, HIP, TOTAL, ANTERIOR APPROACH (Left) as a surgical intervention.  The patient's history has been reviewed, patient examined, no change in status, stable for surgery.  I have reviewed the patient's chart and labs.  Questions were answered to the patient's satisfaction.     Samuel Crock [name]

## 2023-05-22 NOTE — Anesthesia Procedure Notes (Signed)
 Spinal  Patient location during procedure: OR Start time: 05/22/2023 12:36 PM Reason for block: surgical anesthesia Staffing Performed: resident/CRNA  Resident/CRNA: Bernhard Brine, CRNA Performed by: Bernhard Brine, CRNA Authorized by: Peggy Bowens, MD   Preanesthetic Checklist Completed: patient identified, IV checked, site marked, risks and benefits discussed, surgical consent, monitors and equipment checked, pre-op evaluation and timeout performed Spinal Block Patient position: sitting Prep: Betadine  Patient monitoring: heart rate, continuous pulse ox, blood pressure and cardiac monitor Approach: midline Location: L4-5 Injection technique: single-shot Needle Needle type: Whitacre and Introducer  Needle gauge: 24 G Needle length: 9 cm Assessment Sensory level: T4 Events: CSF return Additional Notes Negative paresthesia. Negative blood return. Positive free-flowing CSF. Expiration date of kit checked and confirmed. Patient tolerated procedure well, without complications.

## 2023-05-22 NOTE — Op Note (Signed)
 OPERATIVE REPORT- TOTAL HIP ARTHROPLASTY   PREOPERATIVE DIAGNOSIS: Osteoarthritis of the Left hip.   POSTOPERATIVE DIAGNOSIS: Osteoarthritis of the Left  hip.   PROCEDURE: Left total hip arthroplasty, anterior approach.   SURGEON: Liliane Rei, MD   ASSISTANT: Brinton Canavan, PA-C  ANESTHESIA:  Spinal  ESTIMATED BLOOD LOSS:-350 mL    DRAINS: None  COMPLICATIONS: None   CONDITION: PACU - hemodynamically stable.   BRIEF CLINICAL NOTE: Kendra Leonard is a 73 y.o. female who has advanced end-  stage arthritis of their Left  hip with progressively worsening pain and  dysfunction.The patient has failed nonoperative management and presents for  total hip arthroplasty.   PROCEDURE IN DETAIL: After successful administration of spinal  anesthetic, the traction boots for the [facility] bed were placed on both  feet and the patient was placed onto the [facility] bed, boots placed into the leg  holders. The Left hip was then isolated from the perineum with plastic  drapes and prepped and draped in the usual sterile fashion. ASIS and  greater trochanter were marked and a oblique incision was made, starting  at about 1 cm lateral and 2 cm distal to the ASIS and coursing towards  the anterior cortex of the femur. The skin was cut with a 10 blade  through subcutaneous tissue to the level of the fascia overlying the  tensor fascia lata muscle. The fascia was then incised in line with the  incision at the junction of the anterior third and posterior 2/3rd. The  muscle was teased off the fascia and then the interval between the TFL  and the rectus was developed. The Hohmann retractor was then placed at  the top of the femoral neck over the capsule. The vessels overlying the  capsule were cauterized and the fat on top of the capsule was removed.  A Hohmann retractor was then placed anterior underneath the rectus  femoris to give exposure to the entire anterior capsule. A T-shaped  capsulotomy  was performed. The edges were tagged and the femoral head  was identified.       Osteophytes are removed off the superior acetabulum.  The femoral neck was then cut in situ with an oscillating saw. Traction  was then applied to the left lower extremity utilizing the [facility]  traction. The femoral head was then removed. Retractors were placed  around the acetabulum and then circumferential removal of the labrum was  performed. Osteophytes were also removed. Reaming starts at 47 mm to  medialize and  Increased in 2 mm increments to 49 mm. We reamed in  approximately 40 degrees of abduction, 20 degrees anteversion. A 50 mm  pinnacle acetabular shell was then impacted in anatomic position under  fluoroscopic guidance with excellent purchase. We did not need to place  any additional dome screws. A 32 mm neutral + 4 marathon liner was then  placed into the acetabular shell.       The femoral lift was then placed along the lateral aspect of the femur  just distal to the vastus ridge. The leg was  externally rotated and capsule  was stripped off the inferior aspect of the femoral neck down to the  level of the lesser trochanter, this was done with electrocautery. The femur was lifted after this was performed. The  leg was then placed in an extended and adducted position essentially delivering the femur. We also removed the capsule superiorly and the piriformis from the piriformis fossa to  gain excellent exposure of the  proximal femur. Rongeur was used to remove some cancellous bone to get  into the lateral portion of the proximal femur for placement of the  initial starter reamer. The starter broaches was placed  the starter broach  and was shown to go down the center of the canal. Broaching  with the Actis system was then performed starting at size 0  coursing  Up to size 4. A size 4 had excellent torsional and rotational  and axial stability. The trial standard offset neck was then placed  with a 32  + 1 trial head. The hip was then reduced. We confirmed that  the stem was in the canal both on AP and lateral x-rays. It also has excellent sizing. The hip was reduced with outstanding stability through full extension and full external rotation.. AP pelvis was taken and the leg lengths were measured and found to be equal. Hip was then dislocated again and the femoral head and neck removed. The  femoral broach was removed. Size 4 Actis stem with a standard offset  neck was then impacted into the femur following native anteversion. Has  excellent purchase in the canal. Excellent torsional and rotational and  axial stability. It is confirmed to be in the canal on AP and lateral  fluoroscopic views. The 32 + 1  ceramic head was placed and the hip  reduced with outstanding stability. Again AP pelvis was taken and it  confirmed that the leg lengths were equal. The wound was then copiously  irrigated with saline solution and the capsule reattached and repaired  with Ethibond suture. 30 ml of .25% Bupivicaine was  injected into the capsule and into the edge of the tensor fascia lata as well as subcutaneous tissue. The fascia overlying the tensor fascia lata was then closed with a running #1 V-Loc. Subcu was closed with interrupted 2-0 Vicryl and subcuticular running 4-0 Monocryl. Incision was cleaned  and dried. Steri-Strips and a bulky sterile dressing applied. The patient was awakened and transported to  recovery in stable condition.        Please note that a surgical assistant was a medical necessity for this procedure to perform it in a safe and expeditious manner. Assistant was necessary to provide appropriate retraction of vital neurovascular structures and to prevent femoral fracture and allow for anatomic placement of the prosthesis.  Liliane Rei, M.D.

## 2023-05-22 NOTE — Anesthesia Postprocedure Evaluation (Signed)
 Anesthesia Post Note  Patient: Kendra Leonard  Procedure(s) Performed: ARTHROPLASTY, HIP, TOTAL, ANTERIOR APPROACH (Left: Hip)     Patient location during evaluation: PACU Anesthesia Type: Spinal Level of consciousness: awake and alert Pain management: pain level controlled Vital Signs Assessment: post-procedure vital signs reviewed and stable Respiratory status: spontaneous breathing and respiratory function stable Cardiovascular status: blood pressure returned to baseline and stable Postop Assessment: spinal receding Anesthetic complications: no  No notable events documented.  Last Vitals:  Vitals:   05/22/23 1500 05/22/23 1521  BP: 96/60 94/60  Pulse: 80 73  Resp: 16 18  Temp:  36.7 C  SpO2: 99% 100%    Last Pain:  Vitals:   05/22/23 1521  TempSrc: Oral  PainSc: 0-No pain                 Melvenia Stabs

## 2023-05-23 ENCOUNTER — Encounter (HOSPITAL_COMMUNITY): Payer: Self-pay | Admitting: Orthopedic Surgery

## 2023-05-23 DIAGNOSIS — M1612 Unilateral primary osteoarthritis, left hip: Secondary | ICD-10-CM | POA: Diagnosis not present

## 2023-05-23 LAB — CBC
HCT: 29.5 % — ABNORMAL LOW (ref 36.0–46.0)
Hemoglobin: 9.7 g/dL — ABNORMAL LOW (ref 12.0–15.0)
MCH: 29.7 pg (ref 26.0–34.0)
MCHC: 32.9 g/dL (ref 30.0–36.0)
MCV: 90.2 fL (ref 80.0–100.0)
Platelets: 205 10*3/uL (ref 150–400)
RBC: 3.27 MIL/uL — ABNORMAL LOW (ref 3.87–5.11)
RDW: 13.3 % (ref 11.5–15.5)
WBC: 14.4 10*3/uL — ABNORMAL HIGH (ref 4.0–10.5)
nRBC: 0 % (ref 0.0–0.2)

## 2023-05-23 LAB — BASIC METABOLIC PANEL WITH GFR
Anion gap: 11 (ref 5–15)
BUN: 12 mg/dL (ref 8–23)
CO2: 19 mmol/L — ABNORMAL LOW (ref 22–32)
Calcium: 8.5 mg/dL — ABNORMAL LOW (ref 8.9–10.3)
Chloride: 100 mmol/L (ref 98–111)
Creatinine, Ser: 0.72 mg/dL (ref 0.44–1.00)
GFR, Estimated: >60 mL/min (ref >60)
Glucose, Bld: 157 mg/dL — ABNORMAL HIGH (ref 70–99)
Potassium: 3.8 mmol/L (ref 3.5–5.1)
Sodium: 130 mmol/L — ABNORMAL LOW (ref 135–145)

## 2023-05-23 MED ORDER — METHOCARBAMOL 500 MG PO TABS: 500.0000 mg | ORAL_TABLET | Freq: Four times a day (QID) | ORAL | 0 refills | Status: DC | PRN

## 2023-05-23 MED ORDER — RIVAROXABAN 10 MG PO TABS: 10.0000 mg | ORAL_TABLET | Freq: Every day | ORAL | 0 refills | Status: DC

## 2023-05-23 MED ORDER — HYDROCODONE-ACETAMINOPHEN 5-325 MG PO TABS
1.0000 | ORAL_TABLET | Freq: Four times a day (QID) | ORAL | 0 refills | Status: DC | PRN
Start: 2023-05-23 — End: 2023-06-12

## 2023-05-23 MED ORDER — TRAMADOL HCL 50 MG PO TABS
50.0000 mg | ORAL_TABLET | Freq: Four times a day (QID) | ORAL | 0 refills | Status: DC | PRN
Start: 2023-05-23 — End: 2023-06-12

## 2023-05-23 MED ORDER — ONDANSETRON HCL 4 MG PO TABS: 4.0000 mg | ORAL_TABLET | Freq: Four times a day (QID) | ORAL | 0 refills | Status: DC | PRN

## 2023-05-23 NOTE — Progress Notes (Signed)
 Physical Therapy Treatment Patient Details Name: Kendra Leonard MRN: [id number] DOB: 1950-09-19 Today's Date: 05/23/2023   History of Present Illness Pt is 73 yo female s/p L anterior THA on 05/22/23. Pt with hx including but not limited to HLD, OA, neuropathy, endometrial CA, osteopenia, GERD    PT Comments  Pt reported she felt dizzy when walking to the bathroom with nursing earlier this morning. She reports feeling much better now. BP sitting 128/75, standing 115/70. Pt ambulated 140' with RW, no loss of balance. Stair training completed. Pt demonstrates good understanding of HEP. She is ready to DC home from a PT standpoint.     If plan is discharge home, recommend the following: A little help with bathing/dressing/bathroom;Help with stairs or ramp for entrance;Assist for transportation   Can travel by private vehicle        Equipment Recommendations  Rolling walker (2 wheels) ([employer] RW)    Recommendations for Other Services       Precautions / Restrictions Precautions Precautions: Fall Recall of Precautions/Restrictions: Intact Restrictions LLE Weight Bearing Per Provider Order: Weight bearing as tolerated     Mobility  Bed Mobility               General bed mobility comments: up in chair    Transfers Overall transfer level: Needs assistance Equipment used: Rolling walker (2 wheels) Transfers: Sit to/from Stand Sit to Stand: Supervision           General transfer comment: VCs hand placement    Ambulation/Gait Ambulation/Gait assistance: Supervision Gait Distance (Feet): 140 Feet Assistive device: Rolling walker (2 wheels) Gait Pattern/deviations: Step-through pattern Gait velocity: WFL     General Gait Details: steady, no loss of balance   Stairs Stairs: Yes Stairs assistance: Contact guard assist Stair Management: Two rails, Step to pattern, Forwards Number of Stairs: 6 General stair comments: VCs sequencing   Wheelchair Mobility      Tilt Bed    Modified Rankin (Stroke Patients Only)       Balance Overall balance assessment: Needs assistance Sitting-balance support: No upper extremity supported Sitting balance-Leahy Scale: Good     Standing balance support: No upper extremity supported, Bilateral upper extremity supported Standing balance-Leahy Scale: Fair Standing balance comment: RW to ambulate but could static stand without AD                            Communication Communication Communication: No apparent difficulties  Cognition Arousal: Alert Behavior During Therapy: WFL for tasks assessed/performed   PT - Cognitive impairments: No apparent impairments                         Following commands: Intact      Cueing    Exercises Total Joint Exercises Ankle Circles/Pumps: AROM, Both, 10 reps, Supine Quad Sets: AROM, Both, 5 reps, Supine Short Arc Quad: AROM, Left, 5 reps, Supine Heel Slides: AAROM, Left, 10 reps, Supine Hip ABduction/ADduction: AAROM, Left, 10 reps, Supine Long Arc Quad: AROM, Left, 5 reps, Seated    General Comments        Pertinent Vitals/Pain Pain Assessment Pain Score: 7  Pain Location: L hip Pain Descriptors / Indicators: Sore Pain Intervention(s): Limited activity within patient's tolerance, Monitored during session, Premedicated before session, Ice applied    Home Living  Prior Function            PT Goals (current goals can now be found in the care plan section) Acute Rehab PT Goals Patient Stated Goal: return home PT Goal Formulation: With patient/family Time For Goal Achievement: 06/05/23 Potential to Achieve Goals: Good Progress towards PT goals: Progressing toward goals    Frequency    7X/week      PT Plan      Co-evaluation              AM-PAC PT "6 Clicks" Mobility   Outcome Measure  Help needed turning from your back to your side while in a flat bed without using  bedrails?: A Little Help needed moving from lying on your back to sitting on the side of a flat bed without using bedrails?: A Little Help needed moving to and from a bed to a chair (including a wheelchair)?: None Help needed standing up from a chair using your arms (e.g., wheelchair or bedside chair)?: None Help needed to walk in hospital room?: None Help needed climbing 3-5 steps with a railing? : A Little 6 Click Score: 21    End of Session Equipment Utilized During Treatment: Gait belt Activity Tolerance: Patient tolerated treatment well Patient left: with call bell/phone within reach;with chair alarm set;in chair Nurse Communication: Mobility status PT Visit Diagnosis: Other abnormalities of gait and mobility (R26.89);Muscle weakness (generalized) (M62.81)     Time: 1610-9604 PT Time Calculation (min) (ACUTE ONLY): 43 min  Charges:    $Gait Training: 8-22 mins $Therapeutic Exercise: 8-22 mins $Therapeutic Activity: 8-22 mins PT General Charges $$ ACUTE PT VISIT: 1 Visit                     Daymon Evans PT 05/23/2023  Acute Rehabilitation Services  Office [phone]

## 2023-05-23 NOTE — Care Management Obs Status (Signed)
 MEDICARE OBSERVATION STATUS NOTIFICATION   Patient Details  Name: Kendra Leonard MRN: [id number] Date of Birth: [birth year]   Medicare Observation Status Notification Given:  Yes    Amaryllis Junior, LCSW 05/23/2023, 9:21 AM

## 2023-05-23 NOTE — Progress Notes (Signed)
   Subjective: 1 Day Post-Op Procedure(s) (LRB): ARTHROPLASTY, HIP, TOTAL, ANTERIOR APPROACH (Left) Patient reports pain as mild.   Patient seen in rounds by Dr. France Ina. Patient is well, and has had no acute complaints or problems. Denies chest pain or SOB. No issues overnight. Foley catheter removed this AM. We will continue therapy today, ambulated 65' yesterday.   Objective: Vital signs in last 24 hours: Temp:  [96.5 F (35.8 C)-98 F (36.7 C)] 97.8 F (36.6 C) (04/22 0508) Pulse Rate:  [73-92] 85 (04/22 0508) Resp:  [12-18] 18 (04/22 0508) BP: (89-144)/(60-92) 109/71 (04/22 0508) SpO2:  [96 %-100 %] 96 % (04/22 0508) Weight:  [63 kg] 63 kg (04/21 1052)  Intake/Output from previous day:  Intake/Output Summary (Last 24 hours) at 05/23/2023 0830 Last data filed at 05/23/2023 1610 Gross per 24 hour  Intake 2398.82 ml  Output 4100 ml  Net -1701.18 ml     Intake/Output this shift: No intake/output data recorded.  Labs: Recent Labs    05/23/23 0329  HGB 9.7*   Recent Labs    05/23/23 0329  WBC 14.4*  RBC 3.27*  HCT 29.5*  PLT 205   Recent Labs    05/23/23 0329  NA 130*  K 3.8  CL 100  CO2 19*  BUN 12  CREATININE 0.72  GLUCOSE 157*  CALCIUM  8.5*   No results for input(s): "LABPT", "INR" in the last 72 hours.  Exam: General - Patient is Alert and Oriented Extremity - Neurologically intact Neurovascular intact Sensation intact distally Dorsiflexion/Plantar flexion intact Dressing - dressing C/D/I Motor Function - intact, moving foot and toes well on exam.   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   Weight as of this encounter: 63 kg. Advance diet Up with therapy D/C IV fluids  DVT Prophylaxis - Xarelto  Weight bearing as tolerated. Continue therapy.  Plan is to go Home after hospital stay. Plan for discharge with HEP later today if progresses with therapy and meeting goals. Follow-up in the office in 2 weeks.  The PDMP database was reviewed today prior to any opioid medications being prescribed to this patient.  Sharlynn Dear, PA-C Orthopedic Surgery [phone] 05/23/2023, 8:30 AM

## 2023-05-23 NOTE — TOC Transition Note (Signed)
 Transition of Care [facility]) - Discharge Note   Patient Details  Name: Kendra Leonard MRN: [id number] Date of Birth: 1950/11/18  [facility] CM/SW Contact:  Amaryllis Junior, LCSW Phone Number: 05/23/2023, 10:54 AM   Clinical Narrative:    Pt from home with spouse. Therapy plan is HEP. RW provided through [employer]. No TOC needs.    Final next level of care: Home/Self Care Barriers to Discharge: No Barriers Identified   Patient Goals and CMS Choice Patient states their goals for this hospitalization and ongoing recovery are:: return home CMS Medicare.gov Compare Post Acute Care list provided to::  (NA) Choice offered to / list presented to : NA [place] ownership interest in [facility].provided to::  (NA)    Discharge Placement                       Discharge Plan and Services Additional resources added to the After Visit Summary for                  DME Arranged: Walker rolling DME Agency: [employer]       HH Arranged: NA HH Agency: NA        Social Drivers of Health (SDOH) Interventions SDOH Screenings   Food Insecurity: No Food Insecurity (05/22/2023)  Housing: Low Risk  (05/22/2023)  Transportation Needs: No Transportation Needs (05/22/2023)  Utilities: Not At Risk (05/22/2023)  Alcohol Screen: Low Risk  (05/03/2023)  Depression (PHQ2-9): Low Risk  (05/04/2023)  Financial Resource Strain: Low Risk  (05/03/2023)  Physical Activity: Insufficiently Active (05/03/2023)  Social Connections: Socially Integrated (05/22/2023)  Stress: No Stress Concern Present (05/03/2023)  Tobacco Use: Low Risk  (05/22/2023)     Readmission Risk Interventions     No data to display

## 2023-05-25 NOTE — Discharge Summary (Signed)
 Patient ID: Kendra Leonard MRN: [id number] DOB/AGE: 1951/01/06 73 y.o.  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 female who was admitted 05/22/2023 for operative treatment ofOA (osteoarthritis) of hip. Patient has severe unremitting pain that affects sleep, daily activities, and work/hobbies. After pre-op clearance the patient was taken to the operating room on 05/22/2023 and underwent  Procedure(s): ARTHROPLASTY, HIP, TOTAL, ANTERIOR APPROACH.    Patient was given perioperative antibiotics:  Anti-infectives (From admission, onward)    Start     Dose/Rate Route Frequency Ordered Stop   05/22/23 1800  ceFAZolin  (ANCEF ) IVPB 2g/100 mL premix        2 g 200 mL/hr over 30 Minutes Intravenous Every 6 hours 05/22/23 1509 05/23/23 0958   05/22/23 1030  ceFAZolin  (ANCEF ) IVPB 2g/100 mL premix        2 g 200 mL/hr over 30 Minutes Intravenous On call to O.R. 05/22/23 1022 05/22/23 1258        Patient was given sequential compression devices, early ambulation, and chemoprophylaxis to prevent DVT.  Patient benefited maximally from hospital stay and there were no complications.    Recent vital signs: No data found.   Recent laboratory studies:  Recent Labs    05/23/23 0329  WBC 14.4*  HGB  9.7*  HCT 29.5*  PLT 205  NA 130*  K 3.8  CL 100  CO2 19*  BUN 12  CREATININE 0.72  GLUCOSE 157*  CALCIUM  8.5*     Discharge Medications:   Allergies as of 05/23/2023       Reactions   E-mycin [erythromycin] Diarrhea, Other (See Comments)   Upset stomach        Medication List     STOP taking these medications    CALCIUM  + D PO   Fish Oil 1200 MG Caps   QC TUMERIC COMPLEX PO   Vitamin D-3 25 MCG (1000 UT) Caps       TAKE these medications    acetaminophen  650 MG CR tablet Commonly known as: TYLENOL  Take 1,300 mg by mouth every 8 (eight) hours as needed for pain.   benazepril  40 MG tablet Commonly known as: LOTENSIN  TAKE 1 TABLET DAILY   gabapentin  300 MG capsule Commonly known as: NEURONTIN  Take 300 mg by mouth 3 (three) times daily.   HYDROcodone -acetaminophen  5-325 MG tablet Commonly known as: NORCO/VICODIN Take 1-2 tablets by mouth every 6 (six) hours as needed for severe pain (pain score 7-10).   methocarbamol  500 MG tablet Commonly known as: ROBAXIN  Take 1 tablet (500 mg total) by mouth every 6 (six) hours as needed for muscle spasms.   omeprazole  40 MG capsule Commonly known as: PRILOSEC Take 40 mg by mouth daily before breakfast.  ondansetron  4 MG tablet Commonly known as: ZOFRAN  Take 1 tablet (4 mg total) by mouth every 6 (six) hours as needed for nausea.   PROBIOTIC PO Take 1 capsule by mouth in the morning.   rivaroxaban  10 MG Tabs tablet Commonly known as: XARELTO  Take 1 tablet (10 mg total) by mouth daily with breakfast for 20 days. Then take one 81 mg aspirin once a day for three weeks. Then discontinue aspirin.   traMADol  50 MG tablet Commonly known as: ULTRAM  Take 1-2 tablets (50-100 mg total) by mouth every 6 (six) hours as needed for moderate pain (pain score 4-6).               Discharge Care Instructions  (From admission, onward)           Start     Ordered   05/23/23 0000  Weight bearing as tolerated         05/23/23 0831   05/23/23 0000  Change dressing       Comments: You have an adhesive waterproof bandage over the incision. Leave this in place until your first follow-up appointment. Once you remove this you will not need to place another bandage.   05/23/23 0831            Diagnostic Studies: DG Pelvis Portable Result Date: 05/22/2023 CLINICAL DATA:  Status post hip replacement. EXAM: PORTABLE PELVIS 1-2 VIEWS COMPARISON:  None Available. FINDINGS: Left hip arthroplasty in expected alignment. No periprosthetic lucency or fracture. Recent postsurgical change includes air and edema in the soft tissues. Right hip arthropathy IMPRESSION: Left hip arthroplasty without immediate postoperative complication. Electronically Signed   By: Chadwick Colonel M.D.   On: 05/22/2023 16:49   DG HIP PORT UNILAT WITH PELVIS 1V LEFT Result Date: 05/22/2023 CLINICAL DATA:  Elective surgery. EXAM: DG HIP (WITH OR WITHOUT PELVIS) 1V PORT LEFT COMPARISON:  None Available. FINDINGS: Three fluoroscopic spot views of the pelvis and left hip obtained in the operating room. Images during hip arthroplasty. Fluoroscopy time 16.6 seconds. Dose 0.4627 mGy. IMPRESSION: Intraoperative fluoroscopy during left hip arthroplasty. Electronically Signed   By: Chadwick Colonel M.D.   On: 05/22/2023 16:49   DG C-Arm 1-60 Min-No Report Result Date: 05/22/2023 Fluoroscopy was utilized by the requesting physician.  No radiographic interpretation.   DG C-Arm 1-60 Min-No Report Result Date: 05/22/2023 Fluoroscopy was utilized by the requesting physician.  No radiographic interpretation.    Disposition: Discharge disposition: 01-Home or Self Care       Discharge Instructions     Call MD / Call 911   Complete by: As directed    If you experience chest pain or shortness of breath, CALL 911 and be transported to the hospital emergency room.  If you develope a fever above 101 F, pus (white drainage) or increased drainage or  redness at the wound, or calf pain, call your surgeon's office.   Change dressing   Complete by: As directed    You have an adhesive waterproof bandage over the incision. Leave this in place until your first follow-up appointment. Once you remove this you will not need to place another bandage.   Constipation Prevention   Complete by: As directed    Drink plenty of fluids.  Prune juice may be helpful.  You may use a stool softener, such as Colace (over the counter) 100 mg twice a day.  Use MiraLax  (over the counter) for constipation as needed.   Diet - low sodium heart healthy  Complete by: As directed    Do not sit on low chairs, stoools or toilet seats, as it may be difficult to get up from low surfaces   Complete by: As directed    Driving restrictions   Complete by: As directed    No driving for two weeks   Post-operative opioid taper instructions:   Complete by: As directed    POST-OPERATIVE OPIOID TAPER INSTRUCTIONS: It is important to wean off of your opioid medication as soon as possible. If you do not need pain medication after your surgery it is ok to stop day one. Opioids include: Codeine, Hydrocodone (Norco, Vicodin), Oxycodone (Percocet, oxycontin ) and hydromorphone amongst others.  Long term and even short term use of opiods can cause: Increased pain response Dependence Constipation Depression Respiratory depression And more.  Withdrawal symptoms can include Flu like symptoms Nausea, vomiting And more Techniques to manage these symptoms Hydrate well Eat regular healthy meals Stay active Use relaxation techniques(deep breathing, meditating, yoga) Do Not substitute Alcohol to help with tapering If you have been on opioids for less than two weeks and do not have pain than it is ok to stop all together.  Plan to wean off of opioids This plan should start within one week post op of your joint replacement. Maintain the same interval or time between taking each dose and  first decrease the dose.  Cut the total daily intake of opioids by one tablet each day Next start to increase the time between doses. The last dose that should be eliminated is the evening dose.      TED hose   Complete by: As directed    Use stockings (TED hose) for three weeks on both leg(s).  You may remove them at night for sleeping.   Weight bearing as tolerated   Complete by: As directed         Follow-up Information     Liliane Rei, MD. Go on 06/06/2023.   Specialty: Orthopedic Surgery Why: You have a post op appointment scheduled for Tues 06/06/23 at 3:15pm Contact information: [address] [place] [address] [phone]                  Signed: Sharlynn Dear 05/25/2023, 7:54 AM

## 2023-06-11 NOTE — Progress Notes (Unsigned)
 Subjective:    Patient ID: Kendra Leonard, female    DOB: Jul 03, 1950, 73 y.o.   MRN: [id number]      HPI Kendra Leonard is here for a Physical exam and her chronic medical problems.   S/p L  THR  05/2023   Medications and allergies reviewed with patient and updated if appropriate.  Current Outpatient Medications on File Prior to Visit  Medication Sig Dispense Refill   acetaminophen  (TYLENOL ) 650 MG CR tablet Take 1,300 mg by mouth every 8 (eight) hours as needed for pain.     benazepril  (LOTENSIN ) 40 MG tablet TAKE 1 TABLET DAILY 90 tablet 3   gabapentin  (NEURONTIN ) 300 MG capsule Take 300 mg by mouth 3 (three) times daily.     HYDROcodone -acetaminophen  (NORCO/VICODIN) 5-325 MG tablet Take 1-2 tablets by mouth every 6 (six) hours as needed for severe pain (pain score 7-10). 42 tablet 0   methocarbamol  (ROBAXIN ) 500 MG tablet Take 1 tablet (500 mg total) by mouth every 6 (six) hours as needed for muscle spasms. 40 tablet 0   omeprazole  (PRILOSEC) 40 MG capsule Take 40 mg by mouth daily before breakfast.     ondansetron  (ZOFRAN ) 4 MG tablet Take 1 tablet (4 mg total) by mouth every 6 (six) hours as needed for nausea. 20 tablet 0   Probiotic Product (PROBIOTIC PO) Take 1 capsule by mouth in the morning.     rivaroxaban  (XARELTO ) 10 MG TABS tablet Take 1 tablet (10 mg total) by mouth daily with breakfast for 20 days. Then take one 81 mg aspirin once a day for three weeks. Then discontinue aspirin. 20 tablet 0   traMADol  (ULTRAM ) 50 MG tablet Take 1-2 tablets (50-100 mg total) by mouth every 6 (six) hours as needed for moderate pain (pain score 4-6). 40 tablet 0   No current facility-administered medications on file prior to visit.    Review of Systems     Objective:  There were no vitals filed for this visit. There were no vitals filed for this visit. There is no height or weight on file to calculate BMI.  BP Readings from Last 3 Encounters:  05/23/23 109/71  05/09/23 (!) 126/90   05/04/23 118/78    Wt Readings from Last 3 Encounters:  05/22/23 138 lb 14.2 oz (63 kg)  05/09/23 141 lb (64 kg)  05/04/23 140 lb (63.5 kg)       Physical Exam Constitutional: She appears well-developed and well-nourished. No distress.  HENT:  Head: Normocephalic and atraumatic.  Right Ear: External ear normal. Normal ear canal and TM Left Ear: External ear normal.  Normal ear canal and TM Mouth/Throat: Oropharynx is clear and moist.  Eyes: Conjunctivae normal.  Neck: Neck supple. No tracheal deviation present. No thyromegaly present.  No carotid bruit  Cardiovascular: Normal rate, regular rhythm and normal heart sounds.   No murmur heard.  No edema. Pulmonary/Chest: Effort normal and breath sounds normal. No respiratory distress. She has no wheezes. She has no rales.  Breast: deferred   Abdominal: Soft. She exhibits no distension. There is no tenderness.  Lymphadenopathy: She has no cervical adenopathy.  Skin: Skin is warm and dry. She is not diaphoretic.  Psychiatric: She has a normal mood and affect. Her behavior is normal.     Lab Results  Component Value Date   WBC 14.4 (H) 05/23/2023   HGB 9.7 (L) 05/23/2023   HCT 29.5 (L) 05/23/2023   PLT 205 05/23/2023   GLUCOSE 157 (H)  05/23/2023   CHOL 188 06/09/2022   TRIG 101.0 06/09/2022   HDL 54.10 06/09/2022   LDLCALC 114 (H) 06/09/2022   ALT 12 06/09/2022   AST 16 06/09/2022   NA 130 (L) 05/23/2023   K 3.8 05/23/2023   CL 100 05/23/2023   CREATININE 0.72 05/23/2023   BUN 12 05/23/2023   CO2 19 (L) 05/23/2023   TSH 1.70 06/09/2022   HGBA1C 6.1 06/09/2022         Assessment & Plan:   Physical exam: Screening blood work  ordered Exercise   Weight   Substance abuse  none   Reviewed recommended immunizations.   Health Maintenance  Topic Date Due   Medicare Annual Wellness (AWV)  Never done   Zoster Vaccines- Shingrix (1 of 2) 07/19/1967   COVID-19 Vaccine (5 - 2024-25 season) 10/02/2022   DEXA  SCAN  05/21/2023   MAMMOGRAM  08/01/2023   INFLUENZA VACCINE  09/01/2023   Colonoscopy  12/31/2026   DTaP/Tdap/Td (3 - Td or Tdap) 11/26/2030   Pneumonia Vaccine 29+ Years old  Completed   Hepatitis C Screening  Completed   HPV VACCINES  Aged Out   Meningococcal B Vaccine  Aged Out          See Problem List for Assessment and Plan of chronic medical problems.

## 2023-06-11 NOTE — Patient Instructions (Addendum)

## 2023-06-12 ENCOUNTER — Encounter: Payer: Self-pay | Admitting: Internal Medicine

## 2023-06-12 ENCOUNTER — Ambulatory Visit (INDEPENDENT_AMBULATORY_CARE_PROVIDER_SITE_OTHER): Payer: Medicare HMO | Admitting: Internal Medicine

## 2023-06-12 VITALS — BP 116/78 | HR 96 | Temp 97.8°F | Ht 62.0 in | Wt 140.0 lb

## 2023-06-12 DIAGNOSIS — M85851 Other specified disorders of bone density and structure, right thigh: Secondary | ICD-10-CM | POA: Diagnosis not present

## 2023-06-12 DIAGNOSIS — E7849 Other hyperlipidemia: Secondary | ICD-10-CM

## 2023-06-12 DIAGNOSIS — R7303 Prediabetes: Secondary | ICD-10-CM

## 2023-06-12 DIAGNOSIS — Z0000 Encounter for general adult medical examination without abnormal findings: Secondary | ICD-10-CM | POA: Diagnosis not present

## 2023-06-12 DIAGNOSIS — M85852 Other specified disorders of bone density and structure, left thigh: Secondary | ICD-10-CM | POA: Diagnosis not present

## 2023-06-12 DIAGNOSIS — K219 Gastro-esophageal reflux disease without esophagitis: Secondary | ICD-10-CM

## 2023-06-12 DIAGNOSIS — I10 Essential (primary) hypertension: Secondary | ICD-10-CM

## 2023-06-12 DIAGNOSIS — Z96642 Presence of left artificial hip joint: Secondary | ICD-10-CM | POA: Insufficient documentation

## 2023-06-12 LAB — COMPREHENSIVE METABOLIC PANEL WITH GFR
ALT: 11 U/L (ref 0–35)
AST: 14 U/L (ref 0–37)
Albumin: 4.2 g/dL (ref 3.5–5.2)
Alkaline Phosphatase: 115 U/L (ref 39–117)
BUN: 13 mg/dL (ref 6–23)
CO2: 27 meq/L (ref 19–32)
Calcium: 9.4 mg/dL (ref 8.4–10.5)
Chloride: 100 meq/L (ref 96–112)
Creatinine, Ser: 0.74 mg/dL (ref 0.40–1.20)
GFR: 79.43 mL/min (ref >60.00)
Glucose, Bld: 98 mg/dL (ref 70–99)
Potassium: 4.7 meq/L (ref 3.5–5.1)
Sodium: 136 meq/L (ref 135–145)
Total Bilirubin: 0.4 mg/dL (ref 0.2–1.2)
Total Protein: 7.3 g/dL (ref 6.0–8.3)

## 2023-06-12 LAB — LIPID PANEL
Cholesterol: 178 mg/dL (ref 0–200)
HDL: 60.7 mg/dL (ref >39.00)
LDL Cholesterol: 94 mg/dL (ref 0–99)
NonHDL: 117.04
Total CHOL/HDL Ratio: 3
Triglycerides: 113 mg/dL (ref 0.0–149.0)
VLDL: 22.6 mg/dL (ref 0.0–40.0)

## 2023-06-12 LAB — CBC WITH DIFFERENTIAL/PLATELET
Basophils Absolute: 0.1 10*3/uL (ref 0.0–0.1)
Basophils Relative: 1.2 % (ref 0.0–3.0)
Eosinophils Absolute: 0.1 10*3/uL (ref 0.0–0.7)
Eosinophils Relative: 2.2 % (ref 0.0–5.0)
HCT: 33.4 % — ABNORMAL LOW (ref 36.0–46.0)
Hemoglobin: 11 g/dL — ABNORMAL LOW (ref 12.0–15.0)
Lymphocytes Relative: 29.1 % (ref 12.0–46.0)
Lymphs Abs: 1.6 10*3/uL (ref 0.7–4.0)
MCHC: 33 g/dL (ref 30.0–36.0)
MCV: 89.5 fl (ref 78.0–100.0)
Monocytes Absolute: 0.4 10*3/uL (ref 0.1–1.0)
Monocytes Relative: 6.4 % (ref 3.0–12.0)
Neutro Abs: 3.4 10*3/uL (ref 1.4–7.7)
Neutrophils Relative %: 61.1 % (ref 43.0–77.0)
Platelets: 379 10*3/uL (ref 150.0–400.0)
RBC: 3.73 Mil/uL — ABNORMAL LOW (ref 3.87–5.11)
RDW: 14.3 % (ref 11.5–15.5)
WBC: 5.6 10*3/uL (ref 4.0–10.5)

## 2023-06-12 LAB — HEMOGLOBIN A1C: Hgb A1c MFr Bld: 5.8 % (ref 4.6–6.5)

## 2023-06-12 LAB — TSH: TSH: 1.59 u[IU]/mL (ref 0.35–5.50)

## 2023-06-12 LAB — VITAMIN D 25 HYDROXY (VIT D DEFICIENCY, FRACTURES): VITD: 45.01 ng/mL (ref 30.00–100.00)

## 2023-06-12 NOTE — Assessment & Plan Note (Signed)
 Chronic H/o erosive esophagitis Has small hiatal hernia  GERD controlled Continue omeprazole  40 mg daily

## 2023-06-12 NOTE — Assessment & Plan Note (Signed)
Chronic Regular exercise and healthy diet encouraged Check lipid panel  Continue lifestyle control 

## 2023-06-12 NOTE — Assessment & Plan Note (Signed)
 Chronic Lab Results  Component Value Date   HGBA1C 6.1 06/09/2022   Low sugar / carb diet Stressed regular exercise

## 2023-06-12 NOTE — Assessment & Plan Note (Signed)
 Chronic DEXA up-to-date -  done at [facility] Continue regular exercise Continue calcium  and vitamin D daily Check vitamin D level

## 2023-06-12 NOTE — Assessment & Plan Note (Signed)
 Chronic Blood pressure well-controlled CMP, CBC, lipid panel Continue benazepril  40 mg daily

## 2023-06-24 DIAGNOSIS — N3000 Acute cystitis without hematuria: Secondary | ICD-10-CM | POA: Diagnosis not present

## 2023-06-29 ENCOUNTER — Encounter: Payer: Self-pay | Admitting: Internal Medicine

## 2023-06-29 DIAGNOSIS — Z5189 Encounter for other specified aftercare: Secondary | ICD-10-CM | POA: Diagnosis not present

## 2023-06-29 NOTE — Progress Notes (Unsigned)
    Subjective:    Patient ID: Kendra Leonard, female    DOB: 11-22-1950, 73 y.o.   MRN: [id number]      HPI Malli is here for No chief complaint on file.   ? UTI:  Her symptoms started  *** days ago.  She states dysuria, urinary frequency, urinary urgency, hematuria, abdominal pain, back pain, nausea, fever.  She denies other symptoms.       Medications and allergies reviewed with patient and updated if appropriate.  Current Outpatient Medications on File Prior to Visit  Medication Sig Dispense Refill   acetaminophen  (TYLENOL ) 650 MG CR tablet Take 1,300 mg by mouth every 8 (eight) hours as needed for pain.     benazepril  (LOTENSIN ) 40 MG tablet TAKE 1 TABLET DAILY 90 tablet 3   gabapentin  (NEURONTIN ) 300 MG capsule Take 300 mg by mouth 3 (three) times daily.     methocarbamol  (ROBAXIN ) 500 MG tablet Take 1 tablet (500 mg total) by mouth every 6 (six) hours as needed for muscle spasms. 40 tablet 0   omeprazole  (PRILOSEC) 40 MG capsule Take 40 mg by mouth daily before breakfast.     Probiotic Product (PROBIOTIC PO) Take 1 capsule by mouth in the morning.     No current facility-administered medications on file prior to visit.    Review of Systems     Objective:  There were no vitals filed for this visit. BP Readings from Last 3 Encounters:  06/12/23 116/78  05/23/23 109/71  05/09/23 (!) 126/90   Wt Readings from Last 3 Encounters:  06/12/23 140 lb (63.5 kg)  05/22/23 138 lb 14.2 oz (63 kg)  05/09/23 141 lb (64 kg)   There is no height or weight on file to calculate BMI.    Physical Exam         Assessment & Plan:    See Problem List for Assessment and Plan of chronic medical problems.

## 2023-06-29 NOTE — Patient Instructions (Addendum)
      Blood work was ordered.       Medications changes include :   None    A referral was ordered and someone will call you to schedule an appointment.     Return if symptoms worsen or fail to improve.

## 2023-06-30 ENCOUNTER — Other Ambulatory Visit: Payer: Self-pay

## 2023-06-30 ENCOUNTER — Ambulatory Visit (INDEPENDENT_AMBULATORY_CARE_PROVIDER_SITE_OTHER): Admitting: Internal Medicine

## 2023-06-30 VITALS — BP 104/72 | HR 73 | Temp 98.1°F | Ht 62.0 in | Wt 140.0 lb

## 2023-06-30 DIAGNOSIS — B3731 Acute candidiasis of vulva and vagina: Secondary | ICD-10-CM

## 2023-06-30 DIAGNOSIS — R300 Dysuria: Secondary | ICD-10-CM | POA: Diagnosis not present

## 2023-06-30 LAB — POC URINALSYSI DIPSTICK (AUTOMATED)
Bilirubin, UA: NEGATIVE
Glucose, UA: NEGATIVE
Ketones, UA: NEGATIVE
Leukocytes, UA: NEGATIVE
Nitrite, UA: NEGATIVE
Protein, UA: NEGATIVE
Spec Grav, UA: 1.01
Urobilinogen, UA: 0.2 U/dL
pH, UA: 6

## 2023-06-30 MED ORDER — FLUCONAZOLE 150 MG PO TABS: 150.0000 mg | ORAL_TABLET | Freq: Once | ORAL | 0 refills | Status: AC

## 2023-06-30 NOTE — Assessment & Plan Note (Signed)
 Acute Did feel like she had a UTI about a week ago and did have a virtual visit with her insurance company and they did prescribe her Macrobid  x 5 days Symptoms improved, but did not resolve completely Still has some mild symptoms Urine dip negative for obvious infection-will send for culture, but will hold off on antibiotics unless culture is positive or symptoms worsen

## 2023-06-30 NOTE — Assessment & Plan Note (Signed)
 Acute She is having some vulvar irritation and some of her symptoms she is having could be related to vaginal yeast infection from recent antibiotics Urine dip not suggestive of UTI so we will hold off on antibiotics Diflucan  150 mg p.o. x 1 Urine sent for culture She will call if her symptoms do not improve

## 2023-07-03 LAB — CULTURE, URINE COMPREHENSIVE

## 2023-07-04 ENCOUNTER — Ambulatory Visit: Payer: Self-pay | Admitting: Internal Medicine

## 2023-07-28 DIAGNOSIS — Z0100 Encounter for examination of eyes and vision without abnormal findings: Secondary | ICD-10-CM | POA: Diagnosis not present

## 2023-09-06 ENCOUNTER — Encounter: Payer: Self-pay | Admitting: Gastroenterology

## 2023-09-06 ENCOUNTER — Telehealth: Payer: Self-pay | Admitting: Gastroenterology

## 2023-09-06 NOTE — Telephone Encounter (Signed)
 Error

## 2023-09-07 DIAGNOSIS — N390 Urinary tract infection, site not specified: Secondary | ICD-10-CM | POA: Diagnosis not present

## 2023-09-07 DIAGNOSIS — A499 Bacterial infection, unspecified: Secondary | ICD-10-CM | POA: Diagnosis not present

## 2023-10-12 ENCOUNTER — Ambulatory Visit: Admitting: Gastroenterology

## 2023-10-12 ENCOUNTER — Encounter: Payer: Self-pay | Admitting: Gastroenterology

## 2023-10-12 VITALS — BP 106/80 | HR 83 | Ht 61.0 in | Wt 145.1 lb

## 2023-10-12 DIAGNOSIS — R1310 Dysphagia, unspecified: Secondary | ICD-10-CM

## 2023-10-12 DIAGNOSIS — K219 Gastro-esophageal reflux disease without esophagitis: Secondary | ICD-10-CM

## 2023-10-12 DIAGNOSIS — R0982 Postnasal drip: Secondary | ICD-10-CM

## 2023-10-12 MED ORDER — FAMOTIDINE 20 MG PO TABS: 20.0000 mg | ORAL_TABLET | Freq: Every day | ORAL | 2 refills | Status: DC

## 2023-10-12 NOTE — Patient Instructions (Signed)
 We have sent the following medications to your pharmacy for you to pick up at your convenience: Famotidine  20 mg nightly.  Please purchase the following medications over the counter and take as directed: Cetirizine 10 mg daily.   _______________________________________________________  If your blood pressure at your visit was 140/90 or greater, please contact your primary care physician to follow up on this.  _______________________________________________________  If you are age 75 or older, your body mass index should be between 23-30. Your Body mass index is 27.42 kg/m. If this is out of the aforementioned range listed, please consider follow up with your Primary Care Provider.  If you are age 23 or younger, your body mass index should be between 19-25. Your Body mass index is 27.42 kg/m. If this is out of the aformentioned range listed, please consider follow up with your Primary Care Provider.   ________________________________________________________  The [facility] providers would like to encourage you to use MYCHART to communicate with providers for non-urgent requests or questions.  Due to long hold times on the telephone, sending your provider a message by [facility] may be a faster and more efficient way to get a response.  Please allow 48 business hours for a response.  Please remember that this is for non-urgent requests.  _______________________________________________________  [facility] is using a team-based approach to care.  Your team is made up of your doctor and two to three APPS. Our APPS (Nurse Practitioners and Physician Assistants) work with your physician to ensure care continuity for you. They are fully qualified to address your health concerns and develop a treatment plan. They communicate directly with your gastroenterologist to care for you. Seeing the Advanced Practice Practitioners on your physician's team can help you by facilitating care more  promptly, often allowing for earlier appointments, access to diagnostic testing, procedures, and other specialty referrals.

## 2023-10-12 NOTE — Progress Notes (Signed)
 10/12/2023 Kendra Leonard [id number] September 06, 1950   HISTORY OF PRESENT ILLNESS:  This is a 73 year old female who is a patient of Dr. Darilyn.  She previously followed with Dr. Avram, but then it looks like for couple years she had switched over and was seeing a Dr. Lonni through [facility].  She presents here today with complaints of reflux and belching.  Denies heartburn sensation.  Also feels like there is always phlegm in her throat.  She has been on omeprazole  for several years, was at 20 mg daily and then increased to 40 mg daily couple years ago.  Sometimes feels like little pieces of food gets stuck high up in her throat.  EGD 12/2016:  - Multiple gastric polyps. Biopsied. - 1 cm hiatal hernia. - The examination was otherwise normal. - Dilation performed in the entire esophagus.  Biopsy showed fundic gland polyp, no intestinal metaplasia.   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   Pneumonia    Pre-diabetes    Past Surgical History:  Procedure Laterality Date   ABDOMINAL HYSTERECTOMY  10/2017   for endometrial cancer   CYSTOSCOPY     X4 for microscopic hematuria   EYE SURGERY Bilateral 2022   cataracts extraction w/ IOL   G 2  P 2     intraarticular steroid injection Right 2013 & 2014   Dr Hiram   LAPAROSCOPY     For Infertility work up   PORTA CATH INSERTION  2019   REVISION TOTAL HIP ARTHROPLASTY  2025   TOTAL HIP ARTHROPLASTY Left 05/22/2023   Procedure: ARTHROPLASTY, HIP, TOTAL, ANTERIOR APPROACH;  Surgeon: Melodi Lerner, MD;  Location: [facility];  Service: Orthopedics;  Laterality: Left;   UPPER GI ENDOSCOPY  2008   Dr Avram QUERY TOOTH EXTRACTION      reports that she has  never smoked. She has never used smokeless tobacco. She reports current alcohol use. She reports that she does not use drugs. family history includes Aneurysm in her father; Dementia in her father; Diabetes in her maternal aunt and maternal uncle; Dysrhythmia in her father; GER disease in her brother and father; Hypertension in her brother, father, and mother; Other in her mother; Parkinsonism in her father; Prostate cancer (age of onset: 31) in her father; Stroke in her maternal grandfather; Tremor in her maternal aunt; Uterine cancer (age of onset: 92) in her mother. Allergies  Allergen Reactions   E-Mycin [Erythromycin] Diarrhea and Other (See Comments)    Upset stomach      Outpatient Encounter Medications as of 10/12/2023  Medication Sig   acetaminophen  (TYLENOL ) 650 MG CR tablet Take 1,300 mg by mouth every 8 (eight) hours as needed for pain.   benazepril  (LOTENSIN ) 40 MG tablet TAKE 1 TABLET DAILY   gabapentin  (NEURONTIN ) 300 MG capsule Take 300 mg by mouth 3 (three) times daily. (Patient taking differently: Take 300 mg by mouth daily.)   omeprazole  (PRILOSEC) 40 MG capsule Take 40 mg by mouth daily before breakfast.   Probiotic Product (PROBIOTIC PO) Take 1 capsule by mouth in the morning.   methocarbamol  (ROBAXIN ) 500  MG tablet Take 1 tablet (500 mg total) by mouth every 6 (six) hours as needed for muscle spasms. (Patient not taking: Reported on 10/12/2023)   Omega-3 Fatty Acids (EQL OMEGA 3 FISH OIL) 1200 MG CAPS    Probiotic Product (PROBIOTIC 10 ULTRA STRENGTH PO) daily.   No facility-administered encounter medications on file as of 10/12/2023.    REVIEW OF SYSTEMS  : All other systems reviewed and negative except where noted in the History of Present Illness.   PHYSICAL EXAM: BP 106/80   Pulse 83   Ht 5' 1 (1.549 m)   Wt 145 lb 2 oz (65.8 kg)   BMI 27.42 kg/m  General: Well developed white female in no acute distress Head: Normocephalic and atraumatic Eyes:  Sclerae  anicteric, conjunctiva pink. Ears: Normal auditory acuity Lungs: Clear throughout to auscultation; no W/R/R. Heart: Regular rate and rhythm; no M/R/G. Abdomen: Soft, non-distended.  BS present.  Non-tender. Musculoskeletal: Symmetrical with no gross deformities  Skin: No lesions on visible extremities Extremities: No edema  Neurological: Alert oriented x 4, grossly non-focal Psychological:  Alert and cooperative. Normal mood and affect  ASSESSMENT AND PLAN: 73 year old female with complaints of reflux and belching.  Also feels like there is phlegm in her throat.  She has been on omeprazole  for several years, was at 20 mg daily and then increased to 40 mg daily couple years ago.  Some of this sounds like more of a postnasal drip.  She will continue her omeprazole  40 mg daily, but I would like her to also add a Pepcid  20 mg at bedtime.  She is also going to start an over-the-counter Zyrtec to see if that would help with postnasal drip/allergy type symptoms.  We discussed EGD if symptoms do not improve.  ?  Need for esophagram versus modified barium swallow study.  Will follow-up in 8 to 10 weeks.   CC:  Geofm Glade PARAS, MD

## 2023-10-16 ENCOUNTER — Encounter: Payer: Self-pay | Admitting: Gastroenterology

## 2023-10-16 DIAGNOSIS — R1310 Dysphagia, unspecified: Secondary | ICD-10-CM | POA: Insufficient documentation

## 2023-12-15 ENCOUNTER — Other Ambulatory Visit

## 2023-12-15 ENCOUNTER — Ambulatory Visit: Admitting: Internal Medicine

## 2023-12-15 ENCOUNTER — Encounter: Payer: Self-pay | Admitting: Internal Medicine

## 2023-12-15 VITALS — BP 102/64 | HR 66 | Ht 61.0 in | Wt 145.0 lb

## 2023-12-15 DIAGNOSIS — R0982 Postnasal drip: Secondary | ICD-10-CM | POA: Diagnosis not present

## 2023-12-15 DIAGNOSIS — D649 Anemia, unspecified: Secondary | ICD-10-CM

## 2023-12-15 DIAGNOSIS — J309 Allergic rhinitis, unspecified: Secondary | ICD-10-CM

## 2023-12-15 DIAGNOSIS — Z1211 Encounter for screening for malignant neoplasm of colon: Secondary | ICD-10-CM | POA: Diagnosis not present

## 2023-12-15 DIAGNOSIS — R1319 Other dysphagia: Secondary | ICD-10-CM

## 2023-12-15 DIAGNOSIS — K219 Gastro-esophageal reflux disease without esophagitis: Secondary | ICD-10-CM | POA: Diagnosis not present

## 2023-12-15 LAB — CBC WITH DIFFERENTIAL/PLATELET
Basophils Absolute: 0.1 K/uL (ref 0.0–0.1)
Basophils Relative: 0.8 % (ref 0.0–3.0)
Eosinophils Absolute: 0.1 K/uL (ref 0.0–0.7)
Eosinophils Relative: 1 % (ref 0.0–5.0)
HCT: 39.1 % (ref 36.0–46.0)
Hemoglobin: 13.2 g/dL (ref 12.0–15.0)
Lymphocytes Relative: 28.1 % (ref 12.0–46.0)
Lymphs Abs: 1.9 K/uL (ref 0.7–4.0)
MCHC: 33.8 g/dL (ref 30.0–36.0)
MCV: 87.2 fl (ref 78.0–100.0)
Monocytes Absolute: 0.5 K/uL (ref 0.1–1.0)
Monocytes Relative: 7.1 % (ref 3.0–12.0)
Neutro Abs: 4.3 K/uL (ref 1.4–7.7)
Neutrophils Relative %: 63 % (ref 43.0–77.0)
Platelets: 247 K/uL (ref 150.0–400.0)
RBC: 4.48 Mil/uL (ref 3.87–5.11)
RDW: 13.5 % (ref 11.5–15.5)
WBC: 6.9 K/uL (ref 4.0–10.5)

## 2023-12-15 NOTE — Progress Notes (Signed)
 Kendra Leonard 73 y.o. 1950-09-09 [id number]  Assessment & Plan:   Encounter Diagnoses  Name Primary?   Esophageal dysphagia Yes   Gastroesophageal reflux disease, unspecified whether esophagitis present    Laryngopharyngeal reflux (LPR)?    Colon cancer screening        Gastroesophageal reflux disease with dysphagia with recent exacerbation, ineffective current treatment. Differential includes esophagitisesophageal stricture or motility disorder. - Scheduled upper endoscopy with esophageal dilation. - Discussed risks of endoscopy including bleeding, damage, and reactions to medication. - may need different PPI - ? if LPR - belching   Allergic rhinitis with postnasal drip - throat sensation/globus partially relieved by Zyrtec and Mucinex. - Continue Zyrtec and Mucinex  Anemia, likely post-surgical Anemia likely secondary to recent hip replacement surgery, previous blood count improving. -Ordered blood count to assess current anemia status.      No more CT-C or optical colonoscopy attempts due to sigmoid stricture and LLQ pain issues I think she would need surgical resection of her sigmoid stricture and given prior pelvic radiation and lack of frequent symptomatology we should avoid that in her.  CC: Geofm Glade PARAS, MD   Subjective:   Chief Complaint:  HPI Discussed the use of AI scribe software for clinical note transcription with the patient, who gave verbal consent to proceed.  History of Present Illness   CHARNISE Leonard is a 73 year old female with gastroesophageal reflux disease who presents with worsening reflux symptoms. Seen by Harlene Mail PA-C 10/12/23.  10/12/23 Harlene Moulding PA-C Ass/Plan 73 year old female with complaints of reflux and belching.  Also feels like there is phlegm in her throat.  She has been on omeprazole  for several years, was at 20 mg daily and then increased to 40 mg daily couple years ago.  Some of this sounds like more of a postnasal  drip.  She will continue her omeprazole  40 mg daily, but I would like her to also add a Pepcid  20 mg at bedtime.  She is also going to start an over-the-counter Zyrtec to see if that would help with postnasal drip/allergy type symptoms.  We discussed EGD if symptoms do not improve.  ?  Need for esophagram versus modified barium swallow study.  Will follow-up in 8 to 10 weeks.   Gastroesophageal reflux symptoms - Worsening reflux and belching, particularly in the mornings and at night - Certain foods, such as pizza and cheese sandwiches, exacerbate symptoms, causing discomfort and chest pain - Chest pain described as almost burning, which is unusual for her as she has not experienced burning sensations in a long time - Omeprazole  40 mg in the morning provides some relief - Famotidine  at night did not alleviate symptoms - History of severe heartburn prior to starting omeprazole   Oropharyngeal sensations - Sensation of a lump in the throat, attributed to phlegm - Zyrtec taken daily helps alleviate the lump sensation - Occasional use of Mucinex - Lump sensation more noticeable when Zyrtec is delayed  Dysphagia - Intermittent sensation of food not passing completely, particularly with watermelon - History of esophageal dilation in 2018  Lower gastrointestinal symptoms - History of diverticulosis with recent flare causing abdominal pain and constipation, now resolved - Ongoing bloating  Post-surgical and oncologic history - History of endometrial cancer treated with hysterectomy, radiation, and chemotherapy - Post-treatment changes include difficulty emptying bladder and sensation of needing a bowel movement when urinating  Musculoskeletal and hematologic history - Hip replacement earlier this year - Postoperative anemia, Hgb 11  in May - Not currently taking iron supplements - No fatigue or tiredness  Colorectal cancer screening  - Family history of colon cancer (uncle deceased, cousin  survived) - Unable to complete optical colonoscopy due to sigmoid stricture - 2  virtual colonoscopies performed and negative but even had severe pain in LLQ with these          Allergies  Allergen Reactions   E-Mycin [Erythromycin] Diarrhea and Other (See Comments)    Upset stomach   Current Meds  Medication Sig   acetaminophen  (TYLENOL ) 650 MG CR tablet Take 1,300 mg by mouth every 8 (eight) hours as needed for pain.   benazepril  (LOTENSIN ) 40 MG tablet TAKE 1 TABLET DAILY   calcium  carbonate (OSCAL) 1500 (600 Ca) MG TABS tablet Take by mouth 2 (two) times daily with a meal.   famotidine  (PEPCID ) 20 MG tablet Take 1 tablet (20 mg total) by mouth at bedtime.   gabapentin  (NEURONTIN ) 300 MG capsule Take 300 mg by mouth 3 (three) times daily. (Patient taking differently: Take 300 mg by mouth daily.)   Omega-3 Fatty Acids (EQL OMEGA 3 FISH OIL) 1200 MG CAPS    omeprazole  (PRILOSEC) 40 MG capsule Take 40 mg by mouth daily before breakfast.   Probiotic Product (PROBIOTIC 10 ULTRA STRENGTH PO) daily.   Probiotic Product (PROBIOTIC PO) Take 1 capsule by mouth in the morning.   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   Pneumonia    Pre-diabetes    Past Surgical History:  Procedure Laterality Date   ABDOMINAL HYSTERECTOMY  10/2017   for endometrial cancer   CYSTOSCOPY     X4 for microscopic hematuria   EYE SURGERY Bilateral 2022   cataracts extraction w/ IOL   G 2  P 2     intraarticular steroid injection Right 2013 & 2014   Dr Hiram   LAPAROSCOPY     For Infertility work up   PORTA CATH INSERTION  2019   REVISION TOTAL HIP ARTHROPLASTY  2025   TOTAL HIP ARTHROPLASTY Left 05/22/2023   Procedure:  ARTHROPLASTY, HIP, TOTAL, ANTERIOR APPROACH;  Surgeon: Melodi Lerner, MD;  Location: [facility];  Service: Orthopedics;  Laterality: Left;   UPPER GI ENDOSCOPY  2008   Dr Avram QUERY TOOTH EXTRACTION     Social History   Social History Narrative   Walking regularly for exercise   family history includes Aneurysm in her father; Dementia in her father; Diabetes in her maternal aunt and maternal uncle; Dysrhythmia in her father; GER disease in her brother and father; Hypertension in her brother, father, and mother; Other in her mother; Parkinsonism in her father; Prostate cancer (age of onset: 29) in her father; Stroke in her maternal grandfather; Tremor in her maternal aunt; Uterine cancer (age of onset: 33) in her mother.   Review of Systems  As above Objective:  @BP  102/64   Pulse 66   Ht 5' 1 (1.549 m)   Wt 145 lb (65.8 kg)   BMI 27.40 kg/m @  General:  Well-developed, well-nourished and in no acute distress Eyes:  anicteric. ENT:  Mouth and posterior pharynx free of lesions.  Neck:   supple w/o thyromegaly or mass.  Lungs: Clear to auscultation bilaterally. Heart:   S1S2, no rubs, murmurs, gallops. Abdomen:  soft, non-tender, mass and BS+.  Lymph:  no cervical or supraclavicular adenopathy. Skin   no rash. Neuro:  A&O x 3.  Psych:  appropriate mood and  Affect.   Data Reviewed: See HPI  I spent 50 minutes of time, including in depth chart review, independent review of results as outlined above, communicating results with the patient directly, face-to-face time with the patient, coordinating care, ordering studies and medications as appropriate, and documentation.

## 2023-12-15 NOTE — Patient Instructions (Signed)
 Your provider has requested that you go to the basement level for lab work before leaving today. Press B on the elevator. The lab is located at the first door on the left as you exit the elevator.  Due to recent changes in healthcare laws, you may see the results of your imaging and laboratory studies on MyChart before your provider has had a chance to review them.  We understand that in some cases there may be results that are confusing or concerning to you. Not all laboratory results come back in the same time frame and the provider may be waiting for multiple results in order to interpret others.  Please give us  48 hours in order for your provider to thoroughly review all the results before contacting the office for clarification of your results.   You have been scheduled for an endoscopy. Please follow written instructions given to you at your visit today.  If you use inhalers (even only as needed), please bring them with you on the day of your procedure.  If you take any of the following medications, they will need to be adjusted prior to your procedure:   DO NOT TAKE 7 DAYS PRIOR TO TEST- Trulicity (dulaglutide) Ozempic, Wegovy (semaglutide) Mounjaro, Zepbound (tirzepatide) Bydureon Bcise (exanatide extended release)  DO NOT TAKE 1 DAY PRIOR TO YOUR TEST Rybelsus (semaglutide) Adlyxin (lixisenatide) Victoza (liraglutide) Byetta (exanatide) ___________________________________________________________________________   I appreciate the opportunity to care for you. Lupita Commander, MD, [facility]

## 2023-12-18 ENCOUNTER — Ambulatory Visit: Payer: Self-pay | Admitting: Internal Medicine

## 2023-12-19 ENCOUNTER — Encounter: Payer: Self-pay | Admitting: Internal Medicine

## 2023-12-19 ENCOUNTER — Ambulatory Visit (AMBULATORY_SURGERY_CENTER): Admitting: Internal Medicine

## 2023-12-19 VITALS — BP 119/55 | HR 70 | Temp 97.3°F | Resp 17 | Ht 61.0 in | Wt 145.0 lb

## 2023-12-19 DIAGNOSIS — K449 Diaphragmatic hernia without obstruction or gangrene: Secondary | ICD-10-CM | POA: Diagnosis not present

## 2023-12-19 DIAGNOSIS — R1310 Dysphagia, unspecified: Secondary | ICD-10-CM | POA: Diagnosis not present

## 2023-12-19 MED ORDER — RABEPRAZOLE SODIUM 20 MG PO TBEC
20.0000 mg | DELAYED_RELEASE_TABLET | Freq: Every day | ORAL | 3 refills | Status: AC
Start: 2023-12-19 — End: ?

## 2023-12-19 MED ORDER — SODIUM CHLORIDE 0.9 % IV SOLN: 500.0000 mL | Freq: Once | INTRAVENOUS | Status: DC

## 2023-12-19 NOTE — Progress Notes (Signed)
 Report to PACU, RN, vss, BBS= Clear.

## 2023-12-19 NOTE — Patient Instructions (Addendum)
 There was a hiatal hernia meaning the stomach slips into the chest some.  This is quite common and is often associated with acid reflux difficulty.  It also increases the chance of belching.  The lining of the stomach and the esophagus looked normal as did the upper intestine called the duodenum, except for some tiny stomach polyps that are not an issue.  I did dilate the esophagus to see if that would help you swallow better.  Though I think most of your problems are related to ear nose and throat issues with postnasal drip, you do get help by taking omeprazole .  Since that was incomplete I am trying a different acid blocking medicine called rabeprazole or generic Aciphex.  This was sent to your mail-order pharmacy.  I appreciate the opportunity to care for you. Kendra CHARLENA Commander, MD, FACG  YOU HAD AN ENDOSCOPIC PROCEDURE TODAY AT THE [facility]:   Refer to the procedure report that was given to you for any specific questions about what was found during the examination.  If the procedure report does not answer your questions, please call your gastroenterologist to clarify.  If you requested that your care partner not be given the details of your procedure findings, then the procedure report has been included in a sealed envelope for you to review at your convenience later.  YOU SHOULD EXPECT: Some feelings of bloating in the abdomen. Passage of more gas than usual.  Walking can help get rid of the air that was put into your GI tract during the procedure and reduce the bloating. If you had a lower endoscopy (such as a colonoscopy or flexible sigmoidoscopy) you may notice spotting of blood in your stool or on the toilet paper. If you underwent a bowel prep for your procedure, you may not have a normal bowel movement for a few days.  Please Note:  You might notice some irritation and congestion in your nose or some drainage.  This is from the oxygen used during your procedure.  There is no need  for concern and it should clear up in a day or so.  SYMPTOMS TO REPORT IMMEDIATELY:  Following upper endoscopy (EGD)  Vomiting of blood or coffee ground material  New chest pain or pain under the shoulder blades  Painful or persistently difficult swallowing  New shortness of breath  Fever of 100F or higher  Black, tarry-looking stools  For urgent or emergent issues, a gastroenterologist can be reached at any hour by calling [phone]. Do not use MyChart messaging for urgent concerns.    DIET:  We do recommend a small meal at first, but then you may proceed to your regular diet.  Drink plenty of fluids but you should avoid alcoholic beverages for 24 hours.  ACTIVITY:  You should plan to take it easy for the rest of today and you should NOT DRIVE or use heavy machinery until tomorrow (because of the sedation medicines used during the test).    FOLLOW UP: Our staff will call the number listed on your records the next business day following your procedure.  We will call around 7:15- 8:00 am to check on you and address any questions or concerns that you may have regarding the information given to you following your procedure. If we do not reach you, we will leave a message.     If any biopsies were taken you will be contacted by phone or by letter within the next 1-3 weeks.  Please  call us  at [phone] if you have not heard about the biopsies in 3 weeks.    SIGNATURES/CONFIDENTIALITY: You and/or your care partner have signed paperwork which will be entered into your electronic medical record.  These signatures attest to the fact that that the information above on your After Visit Summary has been reviewed and is understood.  Full responsibility of the confidentiality of this discharge information lies with you and/or your care-partner.

## 2023-12-19 NOTE — Op Note (Signed)
 [facility] Patient Name: Kendra Leonard Procedure Date: 12/19/2023 1:09 PM MRN: [id number] Endoscopist: Lupita FORBES Commander , MD, [id number] Age: 72 Referring MD:  Date of Birth: October 05, 1950 Gender: Female Account #: [id number] Procedure:                Upper GI endoscopy Indications:              Dysphagia, Esophageal reflux symptoms that persist                            despite appropriate therapy - heartburn is helped                            but sore throat and post-nasl drip not so much                            (these have been helped by Zyrtec and Muciex) Medicines:                Monitored Anesthesia Care Procedure:                Pre-Anesthesia Assessment:                           - Prior to the procedure, a History and Physical                            was performed, and patient medications and                            allergies were reviewed. The patient's tolerance of                            previous anesthesia was also reviewed. The risks                            and benefits of the procedure and the sedation                            options and risks were discussed with the patient.                            All questions were answered, and informed consent                            was obtained. Prior Anticoagulants: The patient has                            taken no anticoagulant or antiplatelet agents. ASA                            Grade Assessment: II - A patient with mild systemic                            disease. After reviewing the risks and benefits,  the patient was deemed in satisfactory condition to                            undergo the procedure.                           After obtaining informed consent, the endoscope was                            passed under direct vision. Throughout the                            procedure, the patient's blood pressure, pulse, and                            oxygen  saturations were monitored continuously. The                            GIF [id number] #[id number] was introduced through the                            mouth, and advanced to the second part of duodenum.                            The upper GI endoscopy was accomplished without                            difficulty. The patient tolerated the procedure                            well. Scope In: Scope Out: Findings:                 No endoscopic abnormality was evident in the                            esophagus to explain the patient's complaint of                            dysphagia. It was decided, however, to proceed with                            dilation in the distal esophagus. A TTS dilator was                            passed through the scope. Dilation with an 18-19-20                            mm balloon dilator was performed to 20 mm. The                            dilation site was examined and showed no change.  Estimated blood loss: none.                           A 5 cm hiatal hernia was present.                           The gastroesophageal flap valve was visualized                            endoscopically and classified as Hill Grade IV (no                            fold, wide open lumen, hiatal hernia present).                           Multiple diminutive sessile polyps were found in                            the gastric fundus and in the gastric body.                           The exam was otherwise without abnormality. Complications:            No immediate complications. Estimated Blood Loss:     Estimated blood loss: none. Impression:               - No endoscopic esophageal abnormality to explain                            patient's dysphagia. Esophagus dilated to 20 mm. No                            dilation effect seen.                           - 5 cm hiatal hernia. 35-40 cm                           - Gastroesophageal flap valve  classified as Hill                            Grade IV (no fold, wide open lumen, hiatal hernia                            present).                           - Multiple gastric polyps. Diminutive and classic                            for inocent fundic gland polyps                           - The examination was otherwise normal.                           -  No specimens collected. Recommendation:           - Patient has a contact number available for                            emergencies. The signs and symptoms of potential                            delayed complications were discussed with the                            patient. Return to normal activities tomorrow.                            Written discharge instructions were provided to the                            patient.                           - Resume previous diet.                           - Continue present medications.                           - Change omeprazole  40 mg to rabeprazole 20 mg qd,                            continue Zyrtec and Mucinex Lupita FORBES Commander, MD 12/19/2023 2:05:01 PM This report has been signed electronically.

## 2023-12-19 NOTE — Progress Notes (Signed)
 History and Physical Interval Note:  12/19/2023 1:39 PM  Kendra Leonard  has presented today for endoscopic procedure(s), with the diagnosis of  Encounter Diagnosis  Name Primary?   Dysphagia, unspecified type Yes  .  The various methods of evaluation and treatment have been discussed with the patient and/or family. After consideration of risks, benefits and other options for treatment, the patient has consented to  the endoscopic procedure(s).   The patient's history has been reviewed, patient examined, no change in status, stable for endoscopic procedure(s).  I have reviewed the patient's chart and labs.  Questions were answered to the patient's satisfaction.     [name], MD, [name]

## 2023-12-19 NOTE — Progress Notes (Signed)
 Pt's states no medical or surgical changes since previsit or office visit.

## 2023-12-20 ENCOUNTER — Telehealth: Payer: Self-pay | Admitting: Lactation Services

## 2023-12-20 NOTE — Telephone Encounter (Signed)
  Follow up Call-     12/19/2023   12:52 PM  Call back number  Post procedure Call Back phone  # [phone]  Permission to leave phone message Yes     Patient questions:  Do you have a fever, pain , or abdominal swelling? No. Pain Score  0 *  Have you tolerated food without any problems? Yes.    Have you been able to return to your normal activities? Yes.    Do you have any questions about your discharge instructions: Diet   No. Medications  No. Follow up visit  No.  Do you have questions or concerns about your Care? No.  Actions: * If pain score is 4 or above: No action needed, pain <4.

## 2024-02-21 ENCOUNTER — Encounter: Payer: Self-pay | Admitting: Internal Medicine

## 2024-02-21 NOTE — Progress Notes (Unsigned)
" ° ° °  Subjective:    Patient ID: Alisa VEAR Archer, female    DOB: October 06, 1950, 74 y.o.   MRN: [id number]      HPI Khamani is here for No chief complaint on file.        Medications and allergies reviewed with patient and updated if appropriate.  Medications Ordered Prior to Encounter[1]  Review of Systems     Objective:  There were no vitals filed for this visit. BP Readings from Last 3 Encounters:  12/19/23 (!) 119/55  12/15/23 102/64  10/12/23 106/80   Wt Readings from Last 3 Encounters:  12/19/23 145 lb (65.8 kg)  12/15/23 145 lb (65.8 kg)  10/12/23 145 lb 2 oz (65.8 kg)   There is no height or weight on file to calculate BMI.    Physical Exam         Assessment & Plan:    See Problem List for Assessment and Plan of chronic medical problems.         [1]  Current Outpatient Medications on File Prior to Visit  Medication Sig Dispense Refill   acetaminophen  (TYLENOL ) 650 MG CR tablet Take 1,300 mg by mouth every 8 (eight) hours as needed for pain.     benazepril  (LOTENSIN ) 40 MG tablet TAKE 1 TABLET DAILY 90 tablet 3   calcium  carbonate (OSCAL) 1500 (600 Ca) MG TABS tablet Take by mouth 2 (two) times daily with a meal. (Patient not taking: Reported on 12/19/2023)     gabapentin  (NEURONTIN ) 300 MG capsule Take 300 mg by mouth 3 (three) times daily. (Patient taking differently: Take 300 mg by mouth daily.)     methocarbamol  (ROBAXIN ) 500 MG tablet Take 1 tablet (500 mg total) by mouth every 6 (six) hours as needed for muscle spasms. (Patient not taking: No sig reported) 40 tablet 0   Omega-3 Fatty Acids (EQL OMEGA 3 FISH OIL) 1200 MG CAPS      Probiotic Product (PROBIOTIC 10 ULTRA STRENGTH PO) daily.     Probiotic Product (PROBIOTIC PO) Take 1 capsule by mouth in the morning.     RABEprazole  (ACIPHEX ) 20 MG tablet Take 1 tablet (20 mg total) by mouth daily before breakfast. About 30 minutes before breakfast 90 tablet 3   No current facility-administered  medications on file prior to visit.   "

## 2024-02-22 ENCOUNTER — Ambulatory Visit

## 2024-02-22 ENCOUNTER — Ambulatory Visit (INDEPENDENT_AMBULATORY_CARE_PROVIDER_SITE_OTHER): Admitting: Internal Medicine

## 2024-02-22 VITALS — BP 108/70 | HR 70 | Temp 98.0°F | Ht 62.21 in | Wt 147.0 lb

## 2024-02-22 DIAGNOSIS — I10 Essential (primary) hypertension: Secondary | ICD-10-CM

## 2024-02-22 DIAGNOSIS — K219 Gastro-esophageal reflux disease without esophagitis: Secondary | ICD-10-CM | POA: Diagnosis not present

## 2024-02-22 DIAGNOSIS — R0602 Shortness of breath: Secondary | ICD-10-CM | POA: Diagnosis not present

## 2024-02-22 DIAGNOSIS — R0789 Other chest pain: Secondary | ICD-10-CM

## 2024-02-22 NOTE — Patient Instructions (Addendum)
" ° ° ° ° °  Have a chest xray downstairs    Medications changes include :   stop tumeric and omega for a couple of weeks   Reflux gourmet - natural supplement for reflux     Return if symptoms worsen or fail to improve.  "

## 2024-02-26 ENCOUNTER — Ambulatory Visit: Payer: Self-pay | Admitting: Internal Medicine

## 2024-06-12 ENCOUNTER — Ambulatory Visit: Admitting: Internal Medicine
